# Patient Record
Sex: Female | Born: 1995 | Race: Black or African American | Hispanic: No | Marital: Single | Smoking: Never smoker
Health system: Southern US, Community
[De-identification: ages and names within clinical notes are randomized; demographics above are authoritative.]

## PROBLEM LIST (undated history)

## (undated) ENCOUNTER — Inpatient Hospital Stay (HOSPITAL_COMMUNITY): Payer: Self-pay

## (undated) DIAGNOSIS — N83209 Unspecified ovarian cyst, unspecified side: Secondary | ICD-10-CM

## (undated) DIAGNOSIS — D649 Anemia, unspecified: Secondary | ICD-10-CM

## (undated) DIAGNOSIS — F329 Major depressive disorder, single episode, unspecified: Secondary | ICD-10-CM

## (undated) DIAGNOSIS — F32A Depression, unspecified: Secondary | ICD-10-CM

## (undated) HISTORY — PX: INDUCED ABORTION: SHX677

## (undated) HISTORY — PX: WISDOM TOOTH EXTRACTION: SHX21

---
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
## 2016-05-31 ENCOUNTER — Inpatient Hospital Stay (HOSPITAL_COMMUNITY)
Admission: AD | Admit: 2016-05-31 | Discharge: 2016-05-31 | Disposition: A | Discharge status: Home / Self Care | Payer: Medicaid Other | Source: Ambulatory Visit | Attending: Family Medicine | Admitting: Family Medicine

## 2016-05-31 ENCOUNTER — Encounter (HOSPITAL_COMMUNITY): Payer: Self-pay | Admitting: *Deleted

## 2016-05-31 DIAGNOSIS — O219 Vomiting of pregnancy, unspecified: Secondary | ICD-10-CM

## 2016-05-31 DIAGNOSIS — Z3A22 22 weeks gestation of pregnancy: Secondary | ICD-10-CM | POA: Diagnosis not present

## 2016-05-31 DIAGNOSIS — O218 Other vomiting complicating pregnancy: Secondary | ICD-10-CM | POA: Insufficient documentation

## 2016-05-31 LAB — URINALYSIS, ROUTINE W REFLEX MICROSCOPIC
BILIRUBIN URINE: NEGATIVE
Glucose, UA: NEGATIVE mg/dL
Hgb urine dipstick: NEGATIVE
Ketones, ur: 15 mg/dL — AB
Leukocytes, UA: NEGATIVE
NITRITE: NEGATIVE
Protein, ur: NEGATIVE mg/dL
SPECIFIC GRAVITY, URINE: 1.02 (ref 1.005–1.030)
pH: 6.5 (ref 5.0–8.0)

## 2016-05-31 MED ORDER — PROMETHAZINE HCL 25 MG PO TABS: 12.5000 mg | ORAL_TABLET | Freq: Four times a day (QID) | ORAL | 2 refills | Status: DC | PRN

## 2016-05-31 NOTE — MAU Provider Note (Signed)
Chief Complaint: No chief complaint on file.   None     SUBJECTIVE HPI: Haley Potts is a 20 y.o. G1P0000 at 3853w0d by LMP who presents to maternity admissions reporting nausea x 2 weeks with positive pregnancy [facility] last week.  She denies abdominal pain or vaginal bleeding. She is vomiting 3 x /day.  She has tried eating small frequent meals and sipping fluids but it has not helped. She has not tried any medications.  She is unsure of her LMP and wants to know how far along she is.  She desires prenatal care with [facility]. She denies vaginal bleeding, vaginal itching/burning, urinary symptoms, h/a, dizziness, or fever/chills.     HPI  History reviewed. No pertinent past medical history. Past Surgical History:  Procedure Laterality Date  . WISDOM TOOTH EXTRACTION     Social History   Social History  . Marital status: Single    Spouse name: N/A  . Number of children: N/A  . Years of education: N/A   Occupational History  . Not on file.   Social History Main Topics  . Smoking status: Never Smoker  . Smokeless tobacco: Never Used  . Alcohol use No  . Drug use: No  . Sexual activity: Not Currently    Birth control/ protection: None   Other Topics Concern  . Not on file   Social History Narrative  . No narrative on file   No current facility-administered medications on file prior to encounter.    No current outpatient prescriptions on file prior to encounter.   No Known Allergies  ROS:  Review of Systems  Constitutional: Negative for chills, fatigue and fever.  Respiratory: Negative for shortness of breath.   Cardiovascular: Negative for chest pain.  Gastrointestinal: Positive for nausea and vomiting.  Genitourinary: Negative for difficulty urinating, dysuria, flank pain, pelvic pain, vaginal bleeding, vaginal discharge and vaginal pain.  Neurological: Negative for dizziness and headaches.  Psychiatric/Behavioral: Negative.      I have reviewed  patient's Past Medical Hx, Surgical Hx, Family Hx, Social Hx, medications and allergies.   Physical Exam  Patient Vitals for the past 24 hrs:  BP Temp Temp src Pulse Resp Height Weight  05/31/16 1625 99/62 98.3 F (36.8 C) Oral 67 16 5\' 4"  (1.626 m) 123 lb 3.2 oz (55.9 kg)   Constitutional: Well-developed, well-nourished female in no acute distress.  Cardiovascular: normal rate Respiratory: normal effort GI: Abd soft, non-tender. Pos BS x 4 MS: Extremities nontender, no edema, normal ROM Neurologic: Alert and oriented x 4.  GU: Neg CVAT.  PELVIC EXAM: Deferred  LAB RESULTS Results for orders placed or performed during the hospital encounter of 05/31/16 (from the past 24 hour(s))  Urinalysis, Routine w reflex microscopic (not at [facility])     Status: Abnormal   Collection Time: 05/31/16  4:59 PM  Result Value Ref Range   Color, Urine YELLOW YELLOW   APPearance HAZY (A) CLEAR   Specific Gravity, Urine 1.020 1.005 - 1.030   pH 6.5 5.0 - 8.0   Glucose, UA NEGATIVE NEGATIVE mg/dL   Hgb urine dipstick NEGATIVE NEGATIVE   Bilirubin Urine NEGATIVE NEGATIVE   Ketones, ur 15 (A) NEGATIVE mg/dL   Protein, ur NEGATIVE NEGATIVE mg/dL   Nitrite NEGATIVE NEGATIVE   Leukocytes, UA NEGATIVE NEGATIVE       IMAGING No results found.  MAU Management/MDM: Ordered labs and reviewed results.  Bedside US with CRL c/w 6222w1d, LMP unsure but c/w 7  weeks.  [facility]  Phenergan 12.5-25 mg PO Q 6 hours PRN.  List of safe OTC medications and [facility] providers given today.  Reviewed first trimester precautions/reasons to return to MAU. Pt stable at time of discharge.  ASSESSMENT 1. Nausea and vomiting during pregnancy prior to [redacted] weeks gestation     PLAN Discharge home     Medication List    TAKE these medications   promethazine 25 MG tablet Commonly known as:  PHENERGAN Take 0.5-1 tablets (12.5-25 mg total) by mouth every 6 (six) hours as needed.       Follow-up Information    [facility]. Schedule an appointment as soon as possible for a visit.   Why:  Return to MAU as needed for emergencies Contact information: [address]  [address] [place] [address] [phone]           Sharen CounterLisa Leftwich-Kirby Certified Nurse-Midwife 05/31/2016  7:13 PM

## 2016-05-31 NOTE — MAU Note (Signed)
Few days ago found out she was pregnant. (+preg test in [facility] 11/25- [facility]).  Did not find out how far a long she is.  Has been really nauseated, can't really keep anything on her stomach.

## 2016-06-08 ENCOUNTER — Encounter (HOSPITAL_COMMUNITY): Payer: Self-pay | Admitting: *Deleted

## 2016-06-08 ENCOUNTER — Inpatient Hospital Stay (HOSPITAL_COMMUNITY)
Admission: AD | Admit: 2016-06-08 | Discharge: 2016-06-08 | Disposition: A | Discharge status: Home / Self Care | Payer: Medicaid Other | Source: Ambulatory Visit | Attending: Family Medicine | Admitting: Family Medicine

## 2016-06-08 DIAGNOSIS — Z3A08 8 weeks gestation of pregnancy: Secondary | ICD-10-CM | POA: Insufficient documentation

## 2016-06-08 DIAGNOSIS — O211 Hyperemesis gravidarum with metabolic disturbance: Secondary | ICD-10-CM | POA: Diagnosis not present

## 2016-06-08 DIAGNOSIS — O219 Vomiting of pregnancy, unspecified: Secondary | ICD-10-CM

## 2016-06-08 DIAGNOSIS — Z3491 Encounter for supervision of normal pregnancy, unspecified, first trimester: Secondary | ICD-10-CM

## 2016-06-08 DIAGNOSIS — O210 Mild hyperemesis gravidarum: Secondary | ICD-10-CM | POA: Diagnosis present

## 2016-06-08 DIAGNOSIS — E860 Dehydration: Secondary | ICD-10-CM

## 2016-06-08 LAB — COMPREHENSIVE METABOLIC PANEL
ALBUMIN: 4.6 g/dL (ref 3.5–5.0)
ALT: 9 U/L — AB (ref 14–54)
AST: 19 U/L (ref 15–41)
Alkaline Phosphatase: 47 U/L (ref 38–126)
Anion gap: 9 (ref 5–15)
BUN: 14 mg/dL (ref 6–20)
CHLORIDE: 99 mmol/L — AB (ref 101–111)
CO2: 25 mmol/L (ref 22–32)
CREATININE: 0.71 mg/dL (ref 0.44–1.00)
Calcium: 9.9 mg/dL (ref 8.9–10.3)
GFR calc Af Amer: >60 mL/min (ref >60)
GLUCOSE: 76 mg/dL (ref 65–99)
Potassium: 3.9 mmol/L (ref 3.5–5.1)
Sodium: 133 mmol/L — ABNORMAL LOW (ref 135–145)
Total Bilirubin: 0.5 mg/dL (ref 0.3–1.2)
Total Protein: 8.8 g/dL — ABNORMAL HIGH (ref 6.5–8.1)

## 2016-06-08 LAB — URINALYSIS, ROUTINE W REFLEX MICROSCOPIC
BILIRUBIN URINE: NEGATIVE
Glucose, UA: 150 mg/dL — AB
Hgb urine dipstick: NEGATIVE
Ketones, ur: 80 mg/dL — AB
LEUKOCYTES UA: NEGATIVE
Nitrite: NEGATIVE
Protein, ur: 30 mg/dL — AB
SPECIFIC GRAVITY, URINE: 1.029 (ref 1.005–1.030)
pH: 5 (ref 5.0–8.0)

## 2016-06-08 LAB — CBC
HCT: 43 % (ref 36.0–46.0)
Hemoglobin: 15.3 g/dL — ABNORMAL HIGH (ref 12.0–15.0)
MCH: 30.3 pg (ref 26.0–34.0)
MCHC: 35.6 g/dL (ref 30.0–36.0)
MCV: 85.1 fL (ref 78.0–100.0)
PLATELETS: 290 10*3/uL (ref 150–400)
RBC: 5.05 MIL/uL (ref 3.87–5.11)
RDW: 12.4 % (ref 11.5–15.5)
WBC: 7.3 10*3/uL (ref 4.0–10.5)

## 2016-06-08 MED ORDER — LACTATED RINGERS IV BOLUS (SEPSIS)
1000.0000 mL | Freq: Once | INTRAVENOUS | Status: AC
  Administered 2016-06-08: 1000 mL via INTRAVENOUS

## 2016-06-08 MED ORDER — METOCLOPRAMIDE HCL 10 MG PO TABS: 10.0000 mg | ORAL_TABLET | Freq: Three times a day (TID) | ORAL | 0 refills | Status: DC | PRN

## 2016-06-08 MED ORDER — METOCLOPRAMIDE HCL 5 MG/ML IJ SOLN
10.0000 mg | Freq: Once | INTRAMUSCULAR | Status: AC
  Administered 2016-06-08: 10 mg via INTRAVENOUS
  Filled 2016-06-08: qty 2

## 2016-06-08 MED ORDER — M.V.I. ADULT IV INJ
Freq: Once | INTRAVENOUS | Status: AC
  Administered 2016-06-08: 15:00:00 via INTRAVENOUS
  Filled 2016-06-08: qty 10

## 2016-06-08 MED ORDER — FAMOTIDINE IN NACL 20-0.9 MG/50ML-% IV SOLN
20.0000 mg | Freq: Once | INTRAVENOUS | Status: AC
  Administered 2016-06-08: 20 mg via INTRAVENOUS
  Filled 2016-06-08: qty 50

## 2016-06-08 NOTE — MAU Note (Signed)
Can't technically keep anything down that she eats.  So really hasn't eaten in 3 or 4 days.

## 2016-06-08 NOTE — Discharge Instructions (Signed)
Dehydration, Adult °Dehydration is when there is not enough fluid or water in your body. This happens when you lose more fluids than you take in. Dehydration can range from mild to very bad. It should be treated right away to keep it from getting very bad. °Symptoms of mild dehydration may include: °· Thirst. °· Dry lips. °· Slightly dry mouth. °· Dry, warm skin. °· Dizziness. °Symptoms of moderate dehydration may include: °· Very dry mouth. °· Muscle cramps. °· Dark pee (urine). Pee may be the color of tea. °· Your body making less pee. °· Your eyes making fewer tears. °· Heartbeat that is uneven or faster than normal (palpitations). °· Headache. °· Light-headedness, especially when you stand up from sitting. °· Fainting (syncope). °Symptoms of very bad dehydration may include: °· Changes in skin, such as: °¨ Cold and clammy skin. °¨ Blotchy (mottled) or pale skin. °¨ Skin that does not quickly return to normal after being lightly pinched and let go (poor skin turgor). °· Changes in body fluids, such as: °¨ Feeling very thirsty. °¨ Your eyes making fewer tears. °¨ Not sweating when body temperature is high, such as in hot weather. °¨ Your body making very little pee. °· Changes in vital signs, such as: °¨ Weak pulse. °¨ Pulse that is more than 100 beats a minute when you are sitting still. °¨ Fast breathing. °¨ Low blood pressure. °· Other changes, such as: °¨ Sunken eyes. °¨ Cold hands and feet. °¨ Confusion. °¨ Lack of energy (lethargy). °¨ Trouble waking up from sleep. °¨ Short-term weight loss. °¨ Unconsciousness. °Follow these instructions at home: °· If told by your doctor, drink an ORS: °¨ Make an ORS by using instructions on the package. °¨ Start by drinking small amounts, about ½ cup (120 mL) every 5-10 minutes. °¨ Slowly drink more until you have had the amount that your doctor said to have. °· Drink enough clear fluid to keep your pee clear or pale yellow. If you were told to drink an ORS, finish the ORS  first, then start slowly drinking clear fluids. Drink fluids such as: °¨ Water. Do not drink only water by itself. Doing that can make the salt (sodium) level in your body get too low (hyponatremia). °¨ Ice chips. °¨ Fruit juice that you have added water to (diluted). °¨ Low-calorie sports drinks. °· Avoid: °¨ Alcohol. °¨ Drinks that have a lot of sugar. These include high-calorie sports drinks, fruit juice that does not have water added, and soda. °¨ Caffeine. °¨ Foods that are greasy or have a lot of fat or sugar. °· Take over-the-counter and prescription medicines only as told by your doctor. °· Do not take salt tablets. Doing that can make the salt level in your body get too high (hypernatremia). °· Eat foods that have minerals (electrolytes). Examples include bananas, oranges, potatoes, tomatoes, and spinach. °· Keep all follow-up visits as told by your doctor. This is important. °Contact a doctor if: °· You have belly (abdominal) pain that: °¨ Gets worse. °¨ Stays in one area (localizes). °· You have a rash. °· You have a stiff neck. °· You get angry or annoyed more easily than normal (irritability). °· You are more sleepy than normal. °· You have a harder time waking up than normal. °· You feel: °¨ Weak. °¨ Dizzy. °¨ Very thirsty. °· You have peed (urinated) only a small amount of very dark pee during 6-8 hours. °Get help right away if: °· You have symptoms of   very bad dehydration.  You cannot drink fluids without throwing up (vomiting).  Your symptoms get worse with treatment.  You have a fever.  You have a very bad headache.  You are throwing up or having watery poop (diarrhea) and it:  Gets worse.  Does not go away.  You have blood or something green (bile) in your throw-up.  You have blood in your poop (stool). This may cause poop to look black and tarry.  You have not peed in 6-8 hours.  You pass out (faint).  Your heart rate when you are sitting still is more than 100 beats a  minute.  You have trouble breathing. This information is not intended to replace advice given to you by your health care provider. Make sure you discuss any questions you have with your health care provider. Document Released: 04/16/2009 Document Revised: 01/08/2016 Document Reviewed: 08/14/2015 Elsevier Interactive Patient Education  2017 Elsevier Inc.  Morning Sickness Morning sickness is when you feel sick to your stomach (nauseous) during pregnancy. You may feel sick to your stomach and throw up (vomit). You may feel sick in the morning, but you can feel this way any time of day. Some women feel very sick to their stomach and cannot stop throwing up (hyperemesis gravidarum). Follow these instructions at home:  Only take medicines as told by your doctor.  Take multivitamins as told by your doctor. Taking multivitamins before getting pregnant can stop or lessen the harshness of morning sickness.  Eat dry toast or unsalted crackers before getting out of bed.  Eat 5 to 6 small meals a day.  Eat dry and bland foods like rice and baked potatoes.  Do not drink liquids with meals. Drink between meals.  Do not eat greasy, fatty, or spicy foods.  Have someone cook for you if the smell of food causes you to feel sick or throw up.  If you feel sick to your stomach after taking prenatal vitamins, take them at night or with a snack.  Eat protein when you need a snack (nuts, yogurt, cheese).  Eat unsweetened gelatins for dessert.  Wear a bracelet used for sea sickness (acupressure wristband).  Go to a doctor that puts thin needles into certain body points (acupuncture) to improve how you feel.  Do not smoke.  Use a humidifier to keep the air in your house free of odors.  Get lots of fresh air. Contact a doctor if:  You need medicine to feel better.  You feel dizzy or lightheaded.  You are losing weight. Get help right away if:  You feel very sick to your stomach and cannot  stop throwing up.  You pass out (faint). This information is not intended to replace advice given to you by your health care provider. Make sure you discuss any questions you have with your health care provider. Document Released: 07/28/2004 Document Revised: 11/26/2015 Document Reviewed: 12/05/2012 Elsevier Interactive Patient Education  2017 [employer]

## 2016-06-08 NOTE — MAU Note (Signed)
Patient states the label on the phenergan says to eat something prior to taking and since she can't eat, she is not taking it.

## 2016-06-08 NOTE — MAU Provider Note (Signed)
History     CSN: [id number]  Arrival date and time: 06/08/16 1057   None     Chief Complaint  Patient presents with  . Morning Sickness   G1 @[redacted]w[redacted]d  here with nausea and vomiting x1-2 weeks. Last emesis last night. She in unable tolerate food or fluids. She denies sick contacts. She reports weight loss of 10 lbs. She was seen for similar sx last week and given Phenergan which she reports is not helping. She denies GERD and excessive saliva.   OB History    Gravida Para Term Preterm AB Living   1 0 0 0 0 0   SAB TAB Ectopic Multiple Live Births   0 0 0 0        History reviewed. No pertinent past medical history.  Past Surgical History:  Procedure Laterality Date  . WISDOM TOOTH EXTRACTION      History reviewed. No pertinent family history.  Social History  Substance Use Topics  . Smoking status: Never Smoker  . Smokeless tobacco: Never Used  . Alcohol use No    Allergies: No Known Allergies  Prescriptions Prior to Admission  Medication Sig Dispense Refill Last Dose  . promethazine (PHENERGAN) 25 MG tablet Take 0.5-1 tablets (12.5-25 mg total) by mouth every 6 (six) hours as needed. 30 tablet 2     Review of Systems  Constitutional: Negative.   Gastrointestinal: Positive for nausea and vomiting. Negative for constipation, diarrhea and heartburn.   Physical Exam   Blood pressure 102/68, pulse 79, temperature 98.3 F (36.8 C), temperature source Oral, resp. rate 16, weight 52.3 kg (115 lb 6.4 oz), last menstrual period 04/12/2016.  Physical Exam  Constitutional: She is oriented to person, place, and time. She appears well-developed and well-nourished. No distress (appears comfortable).  HENT:  Head: Normocephalic and atraumatic.  Neck: Normal range of motion. Neck supple.  Cardiovascular: Normal rate.   Respiratory: Effort normal.  GI: Soft. She exhibits no distension and no mass. There is no tenderness. There is no rebound and no guarding.   Musculoskeletal: Normal range of motion.  Neurological: She is alert and oriented to person, place, and time.  Skin: Skin is warm and dry.  Psychiatric: She has a normal mood and affect.   Results for orders placed or performed during the hospital encounter of 06/08/16 (from the past 24 hour(s))  Urinalysis, Routine w reflex microscopic     Status: Abnormal   Collection Time: 06/08/16 11:53 AM  Result Value Ref Range   Color, Urine YELLOW YELLOW   APPearance CLOUDY (A) CLEAR   Specific Gravity, Urine 1.029 1.005 - 1.030   pH 5.0 5.0 - 8.0   Glucose, UA 150 (A) NEGATIVE mg/dL   Hgb urine dipstick NEGATIVE NEGATIVE   Bilirubin Urine NEGATIVE NEGATIVE   Ketones, ur 80 (A) NEGATIVE mg/dL   Protein, ur 30 (A) NEGATIVE mg/dL   Nitrite NEGATIVE NEGATIVE   Leukocytes, UA NEGATIVE NEGATIVE   RBC / HPF 0-5 0 - 5 RBC/hpf   WBC, UA 6-30 0 - 5 WBC/hpf   Bacteria, UA RARE (A) NONE SEEN   Squamous Epithelial / LPF 6-30 (A) NONE SEEN   Mucous PRESENT   CBC     Status: Abnormal   Collection Time: 06/08/16  1:33 PM  Result Value Ref Range   WBC 7.3 4.0 - 10.5 K/uL   RBC 5.05 3.87 - 5.11 MIL/uL   Hemoglobin 15.3 (H) 12.0 - 15.0 g/dL   HCT 86.543.0 78.436.0 - 69.646.0 %  MCV 85.1 78.0 - 100.0 fL   MCH 30.3 26.0 - 34.0 pg   MCHC 35.6 30.0 - 36.0 g/dL   RDW 62.112.4 30.811.5 - 65.715.5 %   Platelets 290 150 - 400 K/uL  Comprehensive metabolic panel     Status: Abnormal   Collection Time: 06/08/16  1:33 PM  Result Value Ref Range   Sodium 133 (L) 135 - 145 mmol/L   Potassium 3.9 3.5 - 5.1 mmol/L   Chloride 99 (L) 101 - 111 mmol/L   CO2 25 22 - 32 mmol/L   Glucose, Bld 76 65 - 99 mg/dL   BUN 14 6 - 20 mg/dL   Creatinine, Ser 8.460.71 0.44 - 1.00 mg/dL   Calcium 9.9 8.9 - 96.210.3 mg/dL   Total Protein 8.8 (H) 6.5 - 8.1 g/dL   Albumin 4.6 3.5 - 5.0 g/dL   AST 19 15 - 41 U/L   ALT 9 (L) 14 - 54 U/L   Alkaline Phosphatase 47 38 - 126 U/L   Total Bilirubin 0.5 0.3 - 1.2 mg/dL   GFR calc non Af Amer >60 >60 mL/min    GFR calc Af Amer >60 >60 mL/min   Anion gap 9 5 - 15    MAU Course  Procedures Reglan 10 mg IV Pepcid 20 mg IV LR 1L bolus MTV IV  MDM Labs ordered and reviewed. Nausea improved after meds. Tolerating po food and fluids. Stable for discharge home.  Assessment and Plan   1. Nausea/vomiting in pregnancy   2. Dehydration   3. First trimester pregnancy    Discharge home Follow up with OB provider of choice Diclegis OTC regimen    Medication List    STOP taking these medications   promethazine 25 MG tablet Commonly known as:  PHENERGAN     TAKE these medications   metoCLOPramide 10 MG tablet Commonly known as:  REGLAN Take 1 tablet (10 mg total) by mouth every 8 (eight) hours as needed for nausea.      Donette LarryMelanie Brad Mcgaughy, CNM 06/08/2016, 1:01 PM

## 2016-06-21 ENCOUNTER — Emergency Department (HOSPITAL_BASED_OUTPATIENT_CLINIC_OR_DEPARTMENT_OTHER)
Admission: EM | Admit: 2016-06-21 | Discharge: 2016-06-21 | Disposition: A | Discharge status: Home / Self Care | Payer: Medicaid Other | Attending: Emergency Medicine | Admitting: Emergency Medicine

## 2016-06-21 ENCOUNTER — Encounter (HOSPITAL_BASED_OUTPATIENT_CLINIC_OR_DEPARTMENT_OTHER): Payer: Self-pay | Admitting: *Deleted

## 2016-06-21 DIAGNOSIS — O210 Mild hyperemesis gravidarum: Secondary | ICD-10-CM | POA: Diagnosis not present

## 2016-06-21 DIAGNOSIS — O219 Vomiting of pregnancy, unspecified: Secondary | ICD-10-CM | POA: Diagnosis present

## 2016-06-21 DIAGNOSIS — Z3A10 10 weeks gestation of pregnancy: Secondary | ICD-10-CM | POA: Insufficient documentation

## 2016-06-21 MED ORDER — METOCLOPRAMIDE HCL 5 MG/ML IJ SOLN
10.0000 mg | Freq: Once | INTRAMUSCULAR | Status: AC
  Administered 2016-06-21: 10 mg via INTRAMUSCULAR
  Filled 2016-06-21: qty 2

## 2016-06-21 MED ORDER — METOCLOPRAMIDE HCL 10 MG PO TABS: 10.0000 mg | ORAL_TABLET | Freq: Three times a day (TID) | ORAL | 0 refills | Status: DC | PRN

## 2016-06-21 MED FILL — METOCLOPRAMIDE 10 MG TABLET: 10 | 10 days supply | Qty: 30 | Fill #0

## 2016-06-21 NOTE — ED Triage Notes (Signed)
Pt reports vomiting.  [facility] and has since vomited after every meal. [redacted] weeks pregnant.

## 2016-06-21 NOTE — ED Provider Notes (Signed)
[facility] Provider Note   CSN: [id number] Arrival date & time: 06/21/16  1420     History   Chief Complaint Chief Complaint  Patient presents with  . Emesis During Pregnancy    HPI Haley Potts is a 20 y.o. female.  20 yo F gravida 1 para 0 with a chief complaint of nausea and vomiting. This is been an ongoing issue for her. She has Reglan prescribed at home. She has run out of this and has had trouble keeping anything down today. Denies fevers or chills denies abdominal pain denies pelvic cramping or bleeding.   The history is provided by the patient and a relative.  Illness  This is a new problem. The current episode started 2 days ago. The problem occurs constantly. The problem has not changed since onset.Pertinent negatives include no chest pain, no headaches and no shortness of breath. Nothing aggravates the symptoms. Nothing relieves the symptoms. She has tried nothing for the symptoms. The treatment provided no relief.    History reviewed. No pertinent past medical history.  There are no active problems to display for this patient.   Past Surgical History:  Procedure Laterality Date  . WISDOM TOOTH EXTRACTION      OB History    Gravida Para Term Preterm AB Living   1 0 0 0 0 0   SAB TAB Ectopic Multiple Live Births   0 0 0 0         Home Medications    Prior to Admission medications   Medication Sig Start Date End Date Taking? Authorizing Provider  metoCLOPramide (REGLAN) 10 MG tablet Take 1 tablet (10 mg total) by mouth every 8 (eight) hours as needed for nausea. 06/21/16   Melene Planan Rashon Rezek, DO    Family History History reviewed. No pertinent family history.  Social History Social History  Substance Use Topics  . Smoking status: Never Smoker  . Smokeless tobacco: Never Used  . Alcohol use No     Allergies   Patient has no known allergies.   Review of Systems Review of Systems  Constitutional: Negative for chills and fever.    HENT: Negative for congestion and rhinorrhea.   Eyes: Negative for redness and visual disturbance.  Respiratory: Negative for shortness of breath and wheezing.   Cardiovascular: Negative for chest pain and palpitations.  Gastrointestinal: Positive for nausea and vomiting.  Genitourinary: Negative for difficulty urinating, dysuria, flank pain, pelvic pain, urgency, vaginal bleeding, vaginal discharge and vaginal pain.  Musculoskeletal: Negative for arthralgias and myalgias.  Skin: Negative for pallor and wound.  Neurological: Negative for dizziness and headaches.     Physical Exam Updated Vital Signs BP 109/66 (BP Location: Left Arm)   Pulse 85   Temp 98.3 F (36.8 C) (Oral)   Resp 18   Ht 5\' 4"  (1.626 m)   Wt 118 lb 8 oz (53.8 kg)   LMP 04/12/2016 Comment: ? October, spotted a few wks ago  SpO2 98%   BMI 20.34 kg/m   Physical Exam  Constitutional: She is oriented to person, place, and time. She appears well-developed and well-nourished. No distress.  HENT:  Head: Normocephalic and atraumatic.  Mouth/Throat: Oropharynx is clear and moist.  Eyes: EOM are normal. Pupils are equal, round, and reactive to light.  Neck: Normal range of motion. Neck supple.  Cardiovascular: Normal rate and regular rhythm.  Exam reveals no gallop and no friction rub.   No murmur heard. Pulmonary/Chest: Effort normal. She has no  wheezes. She has no rales.  Abdominal: Soft. She exhibits no distension and no mass. There is no tenderness. There is no guarding.  Musculoskeletal: She exhibits no edema or tenderness.  Neurological: She is alert and oriented to person, place, and time.  Skin: Skin is warm and dry. She is not diaphoretic.  Psychiatric: She has a normal mood and affect. Her behavior is normal.  Nursing note and vitals reviewed.    ED Treatments / Results  Labs (all labs ordered are listed, but only abnormal results are displayed) Labs Reviewed - No data to display  EKG  EKG  Interpretation None       Radiology No results found.  Procedures Procedures (including critical care time)  Medications Ordered in ED Medications  metoCLOPramide (REGLAN) injection 10 mg (10 mg Intramuscular Given 06/21/16 1533)     Initial Impression / Assessment and Plan / ED Course  I have reviewed the triage vital signs and the nursing notes.  Pertinent labs & imaging results that were available during my care of the patient were reviewed by me and considered in my medical decision making (see chart for details).  Clinical Course     20 yo F Gravida 1 para 0 with a chief complaint of hyperemesis. Patient is well-appearing and nontoxic, appears well-hydrated. We'll give her a dose Reglan here oral trial.  The patient is feeling much better after her Reglan. Able take by mouth without difficulty. Discharge home.  4:19 PM:  I have discussed the diagnosis/risks/treatment options with the patient and family and believe the pt to be eligible for discharge home to follow-up with OB. We also discussed returning to the ED immediately if new or worsening sx occur. We discussed the sx which are most concerning (e.g., pelvic pain, fever, vaginal bleeding) that necessitate immediate return. Medications administered to the patient during their visit and any new prescriptions provided to the patient are listed below.  Medications given during this visit Medications  metoCLOPramide (REGLAN) injection 10 mg (10 mg Intramuscular Given 06/21/16 1533)     The patient appears reasonably screen and/or stabilized for discharge and I doubt any other medical condition or other [facility] requiring further screening, evaluation, or treatment in the ED at this time prior to discharge.   Final Clinical Impressions(s) / ED Diagnoses   Final diagnoses:  Hyperemesis gravidarum    New Prescriptions Current Discharge Medication List       Melene PlanDan Seab Axel, DO 06/21/16 1619

## 2016-07-04 NOTE — L&D Delivery Note (Signed)
Delivery Note  First Stage: Labor onset: 2300 Augmentation : none Analgesia /Anesthesia intrapartum: epidural AROM at 0350  Second Stage: Complete dilation at 0720 Onset of pushing at 0720 FHR second stage 150  Delivery of a viable female at [id number] by CNM in ROA position Loose nuchal cord x 2 Cord double clamped after cessation of pulsation, cut by FOB Cord blood sample collected   Third Stage: Placenta delivered via Tomasa BlaseSchultz intact with 3 VC @ 0745 Placenta disposition: L&D Uterine tone firm / bleeding brisk / Pitocin bolus started post placental; brisk flow of blood - unable to identify source; repair intact and hemostatic despite steady flow of blood.  Fundus firm and midline. Cytotec 800 mcg rectally administered.  Methergine given. Dr. Nira Retortegele called to room to assess source of bleeding - cervix examined thoroughly and found to be intact.  Dr. Adrian BlackwaterStinson called to room - uterine exploration done, Catheter re-inserted with 150 ml urine in return.  Bleeding normal.  Methergine series ordered postpartum.   2nd degree laceration identified  Anesthesia for repair: epidural Repair 2.0 vicryl x 2 Est. Blood Loss (mL): 1000  Complications: [facility]  Mom to postpartum.  Baby to Couplet care / Skin to Skin.  Newborn: Birth Weight: 7 lbs 11.8 oz  Apgar Scores: 7/8 Feeding planned: bottle  Haley Moraolitta Keyaria Lawson  MSN, CNM 01/30/2017, 8:26 AM

## 2016-07-13 ENCOUNTER — Encounter: Payer: Self-pay | Admitting: Certified Nurse Midwife

## 2016-07-13 ENCOUNTER — Ambulatory Visit (INDEPENDENT_AMBULATORY_CARE_PROVIDER_SITE_OTHER): Payer: Medicaid Other | Admitting: Certified Nurse Midwife

## 2016-07-13 ENCOUNTER — Other Ambulatory Visit (HOSPITAL_COMMUNITY)
Admission: RE | Admit: 2016-07-13 | Discharge: 2016-07-13 | Disposition: A | Discharge status: Home / Self Care | Payer: Medicaid Other | Source: Ambulatory Visit | Attending: Certified Nurse Midwife | Admitting: Certified Nurse Midwife

## 2016-07-13 VITALS — BP 100/65 | HR 72 | Temp 99.3°F | Wt 119.9 lb

## 2016-07-13 DIAGNOSIS — Z23 Encounter for immunization: Secondary | ICD-10-CM

## 2016-07-13 DIAGNOSIS — Z3490 Encounter for supervision of normal pregnancy, unspecified, unspecified trimester: Secondary | ICD-10-CM | POA: Diagnosis not present

## 2016-07-13 DIAGNOSIS — Z3492 Encounter for supervision of normal pregnancy, unspecified, second trimester: Secondary | ICD-10-CM

## 2016-07-13 DIAGNOSIS — Z3400 Encounter for supervision of normal first pregnancy, unspecified trimester: Secondary | ICD-10-CM | POA: Insufficient documentation

## 2016-07-13 DIAGNOSIS — O219 Vomiting of pregnancy, unspecified: Secondary | ICD-10-CM

## 2016-07-13 MED ORDER — ONDANSETRON HCL 8 MG PO TABS: 8.0000 mg | ORAL_TABLET | Freq: Three times a day (TID) | ORAL | 2 refills | Status: DC | PRN

## 2016-07-13 MED ORDER — PRENATE PIXIE 10-0.6-0.4-200 MG PO CAPS: 1.0000 | ORAL_CAPSULE | Freq: Every day | ORAL | 12 refills | Status: DC

## 2016-07-13 NOTE — Progress Notes (Signed)
Subjective:    Haley Potts is being seen today for her first obstetrical visit.  This is not a planned pregnancy. She is at [redacted]w[redacted]d gestation. Her obstetrical history is significant for none. Relationship with FOB: significant other, living together. Patient does intend to breast feed. Pregnancy history fully reviewed.  The information documented in the HPI was reviewed and verified.  Menstrual History: OB History    Gravida Para Term Preterm AB Living   1 0 0 0 0 0   SAB TAB Ectopic Multiple Live Births   0 0 0 0         Patient's last menstrual period was 04/12/2016 (approximate).    History reviewed. No pertinent past medical history.  Past Surgical History:  Procedure Laterality Date  . WISDOM TOOTH EXTRACTION       (Not in a hospital admission) No Known Allergies  Social History  Substance Use Topics  . Smoking status: Never Smoker  . Smokeless tobacco: Never Used  . Alcohol use No    History reviewed. No pertinent family history.   Review of Systems Constitutional: negative for weight loss Gastrointestinal: negative for vomiting Genitourinary:negative for genital lesions and vaginal discharge and dysuria Musculoskeletal:negative for back pain Behavioral/Psych: negative for abusive relationship, depression, illegal drug usage and tobacco use    Objective:    BP 100/65   Pulse 72   Temp 99.3 F (37.4 C)   Wt 119 lb 14.4 oz (54.4 kg)   LMP 04/12/2016 (Approximate) Comment: ? October, spotted a few wks ago  BMI 20.58 kg/m  General Appearance:    Alert, cooperative, no distress, appears stated age  Head:    Normocephalic, without obvious abnormality, atraumatic  Eyes:    PERRL, conjunctiva/corneas clear, EOM's intact, fundi    benign, both eyes  Ears:    Normal TM's and external ear canals, both ears  Nose:   Nares normal, septum midline, mucosa normal, no drainage    or sinus tenderness  Throat:   Lips, mucosa, and tongue normal; teeth and gums normal   Neck:   Supple, symmetrical, trachea midline, no adenopathy;    thyroid:  no enlargement/tenderness/nodules; no carotid   bruit or JVD  Back:     Symmetric, no curvature, ROM normal, no CVA tenderness  Lungs:     Clear to auscultation bilaterally, respirations unlabored  Chest Wall:    No tenderness or deformity   Heart:    Regular rate and rhythm, S1 and S2 normal, no murmur, rub   or gallop  Breast Exam:    No tenderness, masses, or nipple abnormality  Abdomen:     Soft, non-tender, bowel sounds active all four quadrants,    no masses, no organomegaly  Genitalia:    Normal female without lesion, discharge or tenderness  Extremities:   Extremities normal, atraumatic, no cyanosis or edema  Pulses:   2+ and symmetric all extremities  Skin:   Skin color, texture, turgor normal, no rashes or lesions  Lymph nodes:   Cervical, supraclavicular, and axillary nodes normal  Neurologic:   CNII-XII intact, normal strength, sensation and reflexes    throughout         Cervix:   Long, thick, closed and posterior.  FHR: 155 by doppler. FH: less than U.    Lab Review Urine pregnancy test Labs reviewed yes Radiologic studies reviewed no Assessment:    Pregnancy at [redacted]w[redacted]d weeks   Late Teen pregnancy  Plan:      Prenatal  vitamins.  Counseling provided regarding continued use of seat belts, cessation of alcohol consumption, smoking or use of illicit drugs; infection precautions i.e., influenza/TDAP immunizations, toxoplasmosis,CMV, parvovirus, listeria and varicella; workplace safety, exercise during pregnancy; routine dental care, safe medications, sexual activity, hot tubs, saunas, pools, travel, caffeine use, fish and methlymercury, potential toxins, hair treatments, varicose veins Weight gain recommendations per IOM guidelines reviewed: underweight/BMI< 18.5--> gain 28 - 40 lbs; normal weight/BMI 18.5 - 24.9--> gain 25 - 35 lbs; overweight/BMI 25 - 29.9--> gain 15 - 25 lbs; obese/BMI >30->gain   11 - 20 lbs Problem list reviewed and updated. FIRST/CF mutation testing/NIPT/QUAD SCREEN/fragile X/Ashkenazi Jewish population testing/Spinal muscular atrophy discussed: ordered. Role of ultrasound in pregnancy discussed; fetal survey: ordered. Amniocentesis discussed: not indicated. VBAC calculator score: VBAC consent form provided Meds ordered this encounter  Medications  . ondansetron (ZOFRAN) 8 MG tablet    Sig: Take 1 tablet (8 mg total) by mouth every 8 (eight) hours as needed for nausea or vomiting.    Dispense:  40 tablet    Refill:  2  . Prenat-FeAsp-Meth-FA-DHA w/o A (PRENATE PIXIE) 10-0.6-0.4-200 MG CAPS    Sig: Take 1 tablet by mouth daily.    Dispense:  30 capsule    Refill:  12    Please process coupon: Rx BIN: [id number], RxPCN: [id number], RxGRP: [id number]: [id number], Rx: [id number]: [id number]  SUF: 01   Orders Placed This Encounter  Procedures  . Culture, OB Urine  . US MFM OB COMP + 14 WK    Standing Status:   Future    Standing Expiration Date:   09/10/2017    Order Specific Question:   Reason for Exam (SYMPTOM  OR DIAGNOSIS REQUIRED)    Answer:   dating, fetal anatomy scan    Order Specific Question:   Preferred imaging location?    Answer:   [facility]-Ultrasound  . Flu Vaccine QUAD 36+ mos IM  . TSH  . Hemoglobinopathy evaluation  . Varicella zoster antibody, IgG  . MaterniT21 PLUS Core+SCA    Order Specific Question:   Is the patient insulin dependent?    Answer:   No    Order Specific Question:   Please enter gestational age. This should be expressed as weeks AND days, i.e. 16w 6d. Enter weeks here. Enter days in next question.    Answer:   6013    Order Specific Question:   Please enter gestational age. This should be expressed as weeks AND days, i.e. 16w 6d. Enter days here. Enter weeks in previous question.    Answer:   1    Order Specific Question:   How was gestational age calculated?    Answer:   LMP    Order Specific Question:   Please give the date of LMP OR Ultrasound OR  Estimated date of delivery.    Answer:   01/17/2017    Order Specific Question:   Number of Fetuses (Type of Pregnancy):    Answer:   1    Order Specific Question:   Indications for performing the test? (please choose all that apply):    Answer:   Routine screening    Order Specific Question:   Other Indications? (Y=Yes, N=No)    Answer:   N    Order Specific Question:   If this is a repeat specimen, please indicate the reason:    Answer:   Not indicated    Order Specific Question:   Please specify the patient's race: (C=White/Caucasion, B=Black, I=Native American, A=Asian, H=Hispanic,  O=Other, U=Unknown)    Answer:   B    Order Specific Question:   Donor Egg - indicate if the egg was obtained from in vitro fertilization.    Answer:   N    Order Specific Question:   Age of Egg Donor.    Answer:   56    Order Specific Question:   Prior Down Syndrome/ONTD screening during current pregnancy.    Answer:   N    Order Specific Question:   Prior First Trimester Testing    Answer:   N    Order Specific Question:   Prior Second Trimester Testing    Answer:   N    Order Specific Question:   Family History of Neural Tube Defects    Answer:   N    Order Specific Question:   Prior Pregnancy with Down Syndrome    Answer:   N    Order Specific Question:   Please give the patient's weight (in pounds)    Answer:   119  . ToxASSURE Select 13 (MW), Urine  . Hemoglobin A1c  . Obstetric Panel, Including HIV  . Cystic Fibrosis Mutation 97    Follow up in 4 weeks. 50% of 30 min visit spent on counseling and coordination of care.

## 2016-07-13 NOTE — Patient Instructions (Addendum)
Second Trimester of Pregnancy The second trimester is from week 13 through week 28 (months 4 through 6). The second trimester is often a time when you feel your best. Your body has also adjusted to being pregnant, and you begin to feel better physically. Usually, morning sickness has lessened or quit completely, you may have more energy, and you may have an increase in appetite. The second trimester is also a time when the fetus is growing rapidly. At the end of the sixth month, the fetus is about 9 inches long and weighs about 1 pounds. You will likely begin to feel the baby move (quickening) between 18 and 20 weeks of the pregnancy. Body changes during your second trimester Your body continues to go through many changes during your second trimester. The changes vary from woman to woman.  Your weight will continue to increase. You will notice your lower abdomen bulging out.  You may begin to get stretch marks on your hips, abdomen, and breasts.  You may develop headaches that can be relieved by medicines. The medicines should be approved by your health care provider.  You may urinate more often because the fetus is pressing on your bladder.  You may develop or continue to have heartburn as a result of your pregnancy.  You may develop constipation because certain hormones are causing the muscles that push waste through your intestines to slow down.  You may develop hemorrhoids or swollen, bulging veins (varicose veins).  You may have back pain. This is caused by:  Weight gain.  Pregnancy hormones that are relaxing the joints in your pelvis.  A shift in weight and the muscles that support your balance.  Your breasts will continue to grow and they will continue to become tender.  Your gums may bleed and may be sensitive to brushing and flossing.  Dark spots or blotches (chloasma, mask of pregnancy) may develop on your face. This will likely fade after the baby is born.  A dark line  from your belly button to the pubic area (linea nigra) may appear. This will likely fade after the baby is born.  You may have changes in your hair. These can include thickening of your hair, rapid growth, and changes in texture. Some women also have hair loss during or after pregnancy, or hair that feels dry or thin. Your hair will most likely return to normal after your baby is born. What to expect at prenatal visits During a routine prenatal visit:  You will be weighed to make sure you and the fetus are growing normally.  Your blood pressure will be taken.  Your abdomen will be measured to track your baby's growth.  The fetal heartbeat will be listened to.  Any test results from the previous visit will be discussed. Your health care provider may ask you:  How you are feeling.  If you are feeling the baby move.  If you have had any abnormal symptoms, such as leaking fluid, bleeding, severe headaches, or abdominal cramping.  If you are using any tobacco products, including cigarettes, chewing tobacco, and electronic cigarettes.  If you have any questions. Other tests that may be performed during your second trimester include:  Blood tests that check for:  Low iron levels (anemia).  Gestational diabetes (between 24 and 28 weeks).  Rh antibodies. This is to check for a protein on red blood cells (Rh factor).  Urine tests to check for infections, diabetes, or protein in the urine.  An ultrasound to   confirm the proper growth and development of the baby.  An amniocentesis to check for possible genetic problems.  Fetal screens for spina bifida and Down syndrome.  HIV (human immunodeficiency virus) testing. Routine prenatal testing includes screening for HIV, unless you choose not to have this test. Follow these instructions at home: Eating and drinking  Continue to eat regular, healthy meals.  Avoid raw meat, uncooked cheese, cat litter boxes, and soil used by cats. These  carry germs that can cause birth defects in the baby.  Take your prenatal vitamins.  Take 1500-2000 mg of calcium daily starting at the 20th week of pregnancy until you deliver your baby.  If you develop constipation:  Take over-the-counter or prescription medicines.  Drink enough fluid to keep your urine clear or pale yellow.  Eat foods that are high in fiber, such as fresh fruits and vegetables, whole grains, and beans.  Limit foods that are high in fat and processed sugars, such as fried and sweet foods. Activity  Exercise only as directed by your health care provider. Experiencing uterine cramps is a good sign to stop exercising.  Avoid heavy lifting, wear low heel shoes, and practice good posture.  Wear your seat belt at all times when driving.  Rest with your legs elevated if you have leg cramps or low back pain.  Wear a good support bra for breast tenderness.  Do not use hot tubs, steam rooms, or saunas. Lifestyle  Avoid all smoking, herbs, alcohol, and unprescribed drugs. These chemicals affect the formation and growth of the baby.  Do not use any products that contain nicotine or tobacco, such as cigarettes and e-cigarettes. If you need help quitting, ask your health care provider.  A sexual relationship may be continued unless your health care provider directs you otherwise. General instructions  Follow your health care provider's instructions regarding medicine use. There are medicines that are either safe or unsafe to take during pregnancy.  Take warm sitz baths to soothe any pain or discomfort caused by hemorrhoids. Use hemorrhoid cream if your health care provider approves.  If you develop varicose veins, wear support hose. Elevate your feet for 15 minutes, 3-4 times a day. Limit salt in your diet.  Visit your dentist if you have not gone yet during your pregnancy. Use a soft toothbrush to brush your teeth and be gentle when you floss.  Keep all follow-up  prenatal visits as told by your health care provider. This is important. Contact a health care provider if:  You have dizziness.  You have mild pelvic cramps, pelvic pressure, or nagging pain in the abdominal area.  You have persistent nausea, vomiting, or diarrhea.  You have a bad smelling vaginal discharge.  You have pain with urination. Get help right away if:  You have a fever.  You are leaking fluid from your vagina.  You have spotting or bleeding from your vagina.  You have severe abdominal cramping or pain.  You have rapid weight gain or weight loss.  You have shortness of breath with chest pain.  You notice sudden or extreme swelling of your face, hands, ankles, feet, or legs.  You have not felt your baby move in over an hour.  You have severe headaches that do not go away with medicine.  You have vision changes. Summary  The second trimester is from week 13 through week 28 (months 4 through 6). It is also a time when the fetus is growing rapidly.  Your body goes   through many changes during pregnancy. The changes vary from woman to woman.  Avoid all smoking, herbs, alcohol, and unprescribed drugs. These chemicals affect the formation and growth your baby.  Do not use any tobacco products, such as cigarettes, chewing tobacco, and e-cigarettes. If you need help quitting, ask your health care provider.  Contact your health care provider if you have any questions. Keep all prenatal visits as told by your health care provider. This is important. This information is not intended to replace advice given to you by your health care provider. Make sure you discuss any questions you have with your health care provider. Document Released: 06/14/2001 Document Revised: 11/26/2015 Document Reviewed: 08/21/2012 [employer] Interactive Patient Education  2017 [employer]  

## 2016-07-14 LAB — CERVICOVAGINAL ANCILLARY ONLY
BACTERIAL VAGINITIS: NEGATIVE
CANDIDA VAGINITIS: NEGATIVE
Chlamydia: NEGATIVE
Neisseria Gonorrhea: NEGATIVE
TRICH (WINDOWPATH): NEGATIVE

## 2016-07-18 LAB — URINE CULTURE, OB REFLEX

## 2016-07-18 LAB — CULTURE, OB URINE

## 2016-07-19 LAB — OBSTETRIC PANEL, INCLUDING HIV
ANTIBODY SCREEN: NEGATIVE
BASOS ABS: 0 10*3/uL (ref 0.0–0.2)
BASOS: 0 %
EOS (ABSOLUTE): 0.1 10*3/uL (ref 0.0–0.4)
Eos: 2 %
HEMATOCRIT: 36.4 % (ref 34.0–46.6)
HIV SCREEN 4TH GENERATION: NONREACTIVE
Hemoglobin: 12.2 g/dL (ref 11.1–15.9)
Hepatitis B Surface Ag: NEGATIVE
Immature Grans (Abs): 0 10*3/uL (ref 0.0–0.1)
Immature Granulocytes: 0 %
LYMPHS: 19 %
Lymphocytes Absolute: 1.5 10*3/uL (ref 0.7–3.1)
MCH: 29.1 pg (ref 26.6–33.0)
MCHC: 33.5 g/dL (ref 31.5–35.7)
MCV: 87 fL (ref 79–97)
MONOCYTES: 7 %
MONOS ABS: 0.5 10*3/uL (ref 0.1–0.9)
NEUTROS ABS: 5.8 10*3/uL (ref 1.4–7.0)
Neutrophils: 72 %
PLATELETS: 314 10*3/uL (ref 150–379)
RBC: 4.19 x10E6/uL (ref 3.77–5.28)
RDW: 13.8 % (ref 12.3–15.4)
RPR Ser Ql: NONREACTIVE
Rh Factor: POSITIVE
Rubella Antibodies, IGG: 15.4 index (ref >0.99)
WBC: 7.9 10*3/uL (ref 3.4–10.8)

## 2016-07-19 LAB — CYSTIC FIBROSIS MUTATION 97: GENE DIS ANAL CARRIER INTERP BLD/T-IMP: NOT DETECTED

## 2016-07-19 LAB — VARICELLA ZOSTER ANTIBODY, IGG: Varicella zoster IgG: 692 index (ref >165)

## 2016-07-19 LAB — HEMOGLOBINOPATHY EVALUATION
HEMOGLOBIN A2 QUANTITATION: 2.5 % (ref 1.8–3.2)
HGB A: 97.5 % (ref 96.4–98.8)
HGB C: 0 %
HGB S: 0 %
Hemoglobin F Quantitation: 0 % (ref 0.0–2.0)

## 2016-07-19 LAB — HEMOGLOBIN A1C
ESTIMATED AVERAGE GLUCOSE: 91 mg/dL
HEMOGLOBIN A1C: 4.8 % (ref 4.8–5.6)

## 2016-07-19 LAB — TSH: TSH: 0.769 u[IU]/mL (ref 0.450–4.500)

## 2016-07-20 LAB — TOXASSURE SELECT 13 (MW), URINE

## 2016-07-21 ENCOUNTER — Other Ambulatory Visit: Payer: Self-pay | Admitting: Certified Nurse Midwife

## 2016-07-21 DIAGNOSIS — Z3400 Encounter for supervision of normal first pregnancy, unspecified trimester: Secondary | ICD-10-CM

## 2016-07-21 LAB — MATERNIT21 PLUS CORE+SCA
Chromosome 13: NEGATIVE
Chromosome 18: NEGATIVE
Chromosome 21: NEGATIVE
PDF: 0
Y CHROMOSOME: NOT DETECTED

## 2016-07-25 ENCOUNTER — Encounter: Payer: Medicaid Other | Admitting: Obstetrics and Gynecology

## 2016-08-10 ENCOUNTER — Ambulatory Visit (INDEPENDENT_AMBULATORY_CARE_PROVIDER_SITE_OTHER): Payer: Medicaid Other | Admitting: Certified Nurse Midwife

## 2016-08-10 VITALS — BP 120/80 | HR 110 | Wt 123.0 lb

## 2016-08-10 DIAGNOSIS — Z3400 Encounter for supervision of normal first pregnancy, unspecified trimester: Secondary | ICD-10-CM

## 2016-08-10 DIAGNOSIS — Z3402 Encounter for supervision of normal first pregnancy, second trimester: Secondary | ICD-10-CM

## 2016-08-10 NOTE — Progress Notes (Signed)
   PRENATAL VISIT NOTE  Subjective:  Haley Potts is a 21 y.o. G1P0000 at 7376w1d being seen today for ongoing prenatal care.  She is currently monitored for the following issues for this low-risk pregnancy and has Supervision of normal pregnancy, antepartum on her problem list.  Patient reports backache, no bleeding, no contractions, no cramping and no leaking.  Contractions: Not present. Vag. Bleeding: None.  Movement: Absent. Denies leaking of fluid.   The following portions of the patient's history were reviewed and updated as appropriate: allergies, current medications, past family history, past medical history, past social history, past surgical history and problem list. Problem list updated.  Objective:   Vitals:   08/10/16 1338  BP: 120/80  Pulse: (!) 110  Weight: 123 lb (55.8 kg)    Fetal Status: Fetal Heart Rate (bpm): 138   Movement: Absent     General:  Alert, oriented and cooperative. Patient is in no acute distress.  Skin: Skin is warm and dry. No rash noted.   Cardiovascular: Normal heart rate noted  Respiratory: Normal respiratory effort, no problems with respiration noted  Abdomen: Soft, gravid, appropriate for gestational age. Pain/Pressure: Absent     Pelvic:  Cervical exam deferred        Extremities: Normal range of motion.     Mental Status: Normal mood and affect. Normal behavior. Normal judgment and thought content.   Assessment and Plan:  Pregnancy: G1P0000 at 5276w1d  1. Supervision of normal first pregnancy, antepartum      Normal discomforts of pregnancy - AFP, Serum, Open Spina Bifida  Preterm labor symptoms and general obstetric precautions including but not limited to vaginal bleeding, contractions, leaking of fluid and fetal movement were reviewed in detail with the patient. Please refer to After Visit Summary for other counseling recommendations.  Return in about 4 weeks (around 09/07/2016) for ROB.   Roe Coombsachelle A Arieonna Medine, CNM

## 2016-08-10 NOTE — Progress Notes (Signed)
Pt states some cramping at night.

## 2016-08-10 NOTE — Patient Instructions (Addendum)
Second Trimester of Pregnancy The second trimester is from week 13 through week 28 (months 4 through 6). The second trimester is often a time when you feel your best. Your body has also adjusted to being pregnant, and you begin to feel better physically. Usually, morning sickness has lessened or quit completely, you may have more energy, and you may have an increase in appetite. The second trimester is also a time when the fetus is growing rapidly. At the end of the sixth month, the fetus is about 9 inches long and weighs about 1 pounds. You will likely begin to feel the baby move (quickening) between 18 and 20 weeks of the pregnancy. Body changes during your second trimester Your body continues to go through many changes during your second trimester. The changes vary from woman to woman.  Your weight will continue to increase. You will notice your lower abdomen bulging out.  You may begin to get stretch marks on your hips, abdomen, and breasts.  You may develop headaches that can be relieved by medicines. The medicines should be approved by your health care provider.  You may urinate more often because the fetus is pressing on your bladder.  You may develop or continue to have heartburn as a result of your pregnancy.  You may develop constipation because certain hormones are causing the muscles that push waste through your intestines to slow down.  You may develop hemorrhoids or swollen, bulging veins (varicose veins).  You may have back pain. This is caused by:  Weight gain.  Pregnancy hormones that are relaxing the joints in your pelvis.  A shift in weight and the muscles that support your balance.  Your breasts will continue to grow and they will continue to become tender.  Your gums may bleed and may be sensitive to brushing and flossing.  Dark spots or blotches (chloasma, mask of pregnancy) may develop on your face. This will likely fade after the baby is born.  A dark line  from your belly button to the pubic area (linea nigra) may appear. This will likely fade after the baby is born.  You may have changes in your hair. These can include thickening of your hair, rapid growth, and changes in texture. Some women also have hair loss during or after pregnancy, or hair that feels dry or thin. Your hair will most likely return to normal after your baby is born. What to expect at prenatal visits During a routine prenatal visit:  You will be weighed to make sure you and the fetus are growing normally.  Your blood pressure will be taken.  Your abdomen will be measured to track your baby's growth.  The fetal heartbeat will be listened to.  Any test results from the previous visit will be discussed. Your health care provider may ask you:  How you are feeling.  If you are feeling the baby move.  If you have had any abnormal symptoms, such as leaking fluid, bleeding, severe headaches, or abdominal cramping.  If you are using any tobacco products, including cigarettes, chewing tobacco, and electronic cigarettes.  If you have any questions. Other tests that may be performed during your second trimester include:  Blood tests that check for:  Low iron levels (anemia).  Gestational diabetes (between 24 and 28 weeks).  Rh antibodies. This is to check for a protein on red blood cells (Rh factor).  Urine tests to check for infections, diabetes, or protein in the urine.  An ultrasound to   confirm the proper growth and development of the baby.  An amniocentesis to check for possible genetic problems.  Fetal screens for spina bifida and Down syndrome.  HIV (human immunodeficiency virus) testing. Routine prenatal testing includes screening for HIV, unless you choose not to have this test. Follow these instructions at home: Eating and drinking  Continue to eat regular, healthy meals.  Avoid raw meat, uncooked cheese, cat litter boxes, and soil used by cats. These  carry germs that can cause birth defects in the baby.  Take your prenatal vitamins.  Take 1500-2000 mg of calcium daily starting at the 20th week of pregnancy until you deliver your baby.  If you develop constipation:  Take over-the-counter or prescription medicines.  Drink enough fluid to keep your urine clear or pale yellow.  Eat foods that are high in fiber, such as fresh fruits and vegetables, whole grains, and beans.  Limit foods that are high in fat and processed sugars, such as fried and sweet foods. Activity  Exercise only as directed by your health care provider. Experiencing uterine cramps is a good sign to stop exercising.  Avoid heavy lifting, wear low heel shoes, and practice good posture.  Wear your seat belt at all times when driving.  Rest with your legs elevated if you have leg cramps or low back pain.  Wear a good support bra for breast tenderness.  Do not use hot tubs, steam rooms, or saunas. Lifestyle  Avoid all smoking, herbs, alcohol, and unprescribed drugs. These chemicals affect the formation and growth of the baby.  Do not use any products that contain nicotine or tobacco, such as cigarettes and e-cigarettes. If you need help quitting, ask your health care provider.  A sexual relationship may be continued unless your health care provider directs you otherwise. General instructions  Follow your health care provider's instructions regarding medicine use. There are medicines that are either safe or unsafe to take during pregnancy.  Take warm sitz baths to soothe any pain or discomfort caused by hemorrhoids. Use hemorrhoid cream if your health care provider approves.  If you develop varicose veins, wear support hose. Elevate your feet for 15 minutes, 3-4 times a day. Limit salt in your diet.  Visit your dentist if you have not gone yet during your pregnancy. Use a soft toothbrush to brush your teeth and be gentle when you floss.  Keep all follow-up  prenatal visits as told by your health care provider. This is important. Contact a health care provider if:  You have dizziness.  You have mild pelvic cramps, pelvic pressure, or nagging pain in the abdominal area.  You have persistent nausea, vomiting, or diarrhea.  You have a bad smelling vaginal discharge.  You have pain with urination. Get help right away if:  You have a fever.  You are leaking fluid from your vagina.  You have spotting or bleeding from your vagina.  You have severe abdominal cramping or pain.  You have rapid weight gain or weight loss.  You have shortness of breath with chest pain.  You notice sudden or extreme swelling of your face, hands, ankles, feet, or legs.  You have not felt your baby move in over an hour.  You have severe headaches that do not go away with medicine.  You have vision changes. Summary  The second trimester is from week 13 through week 28 (months 4 through 6). It is also a time when the fetus is growing rapidly.  Your body goes   through many changes during pregnancy. The changes vary from woman to woman.  Avoid all smoking, herbs, alcohol, and unprescribed drugs. These chemicals affect the formation and growth your baby.  Do not use any tobacco products, such as cigarettes, chewing tobacco, and e-cigarettes. If you need help quitting, ask your health care provider.  Contact your health care provider if you have any questions. Keep all prenatal visits as told by your health care provider. This is important. This information is not intended to replace advice given to you by your health care provider. Make sure you discuss any questions you have with your health care provider. Document Released: 06/14/2001 Document Revised: 11/26/2015 Document Reviewed: 08/21/2012 [employer] Interactive Patient Education  2017 [employer]  

## 2016-08-16 ENCOUNTER — Other Ambulatory Visit: Payer: Self-pay | Admitting: Certified Nurse Midwife

## 2016-08-16 ENCOUNTER — Ambulatory Visit (HOSPITAL_COMMUNITY)
Admission: RE | Admit: 2016-08-16 | Discharge: 2016-08-16 | Disposition: A | Discharge status: Home / Self Care | Payer: Medicaid Other | Source: Ambulatory Visit | Attending: Certified Nurse Midwife | Admitting: Certified Nurse Midwife

## 2016-08-16 DIAGNOSIS — Z3A18 18 weeks gestation of pregnancy: Secondary | ICD-10-CM

## 2016-08-16 DIAGNOSIS — Z3689 Encounter for other specified antenatal screening: Secondary | ICD-10-CM

## 2016-08-16 DIAGNOSIS — Z23 Encounter for immunization: Secondary | ICD-10-CM

## 2016-08-16 DIAGNOSIS — Z3490 Encounter for supervision of normal pregnancy, unspecified, unspecified trimester: Secondary | ICD-10-CM

## 2016-08-17 ENCOUNTER — Other Ambulatory Visit: Payer: Self-pay | Admitting: Certified Nurse Midwife

## 2016-08-17 DIAGNOSIS — Z3400 Encounter for supervision of normal first pregnancy, unspecified trimester: Secondary | ICD-10-CM

## 2016-08-17 LAB — AFP, SERUM, OPEN SPINA BIFIDA
AFP MOM: 1
AFP Value: 47.7 ng/mL
Gest. Age on Collection Date: 17.1 weeks
Maternal Age At EDD: 20 years
OSBR RISK 1 IN: 10000
Test Results:: NEGATIVE
WEIGHT: 123 [lb_av]

## 2016-09-07 ENCOUNTER — Ambulatory Visit (INDEPENDENT_AMBULATORY_CARE_PROVIDER_SITE_OTHER): Payer: Medicaid Other | Admitting: Certified Nurse Midwife

## 2016-09-07 ENCOUNTER — Encounter: Payer: Self-pay | Admitting: Certified Nurse Midwife

## 2016-09-07 VITALS — BP 115/72 | HR 98 | Wt 129.0 lb

## 2016-09-07 DIAGNOSIS — Z3402 Encounter for supervision of normal first pregnancy, second trimester: Secondary | ICD-10-CM

## 2016-09-07 DIAGNOSIS — Z3400 Encounter for supervision of normal first pregnancy, unspecified trimester: Secondary | ICD-10-CM

## 2016-09-07 NOTE — Progress Notes (Signed)
Patient has right sided pain.

## 2016-09-07 NOTE — Progress Notes (Signed)
   PRENATAL VISIT NOTE  Subjective:  Haley Potts is a 21 y.o. G1P0000 at 7423w1d being seen today for ongoing prenatal care.  She is currently monitored for the following issues for this low-risk pregnancy and has Supervision of normal pregnancy, antepartum on her problem list.  Patient reports no complaints.  Contractions: Not present. Vag. Bleeding: None.  Movement: Present. Denies leaking of fluid.   The following portions of the patient's history were reviewed and updated as appropriate: allergies, current medications, past family history, past medical history, past social history, past surgical history and problem list. Problem list updated.  Objective:   Vitals:   09/07/16 1305  BP: 115/72  Pulse: 98  Weight: 129 lb (58.5 kg)    Fetal Status:     Movement: Present     General:  Alert, oriented and cooperative. Patient is in no acute distress.  Skin: Skin is warm and dry. No rash noted.   Cardiovascular: Normal heart rate noted  Respiratory: Normal respiratory effort, no problems with respiration noted  Abdomen: Soft, gravid, appropriate for gestational age. Pain/Pressure: Absent     Pelvic:  Cervical exam deferred        Extremities: Normal range of motion.  Edema: None  Mental Status: Normal mood and affect. Normal behavior. Normal judgment and thought content.   Assessment and Plan:  Pregnancy: G1P0000 at 4323w1d  1. Supervision of normal first pregnancy, antepartum     Doing well  Preterm labor symptoms and general obstetric precautions including but not limited to vaginal bleeding, contractions, leaking of fluid and fetal movement were reviewed in detail with the patient. Please refer to After Visit Summary for other counseling recommendations.  Return in about 4 weeks (around 10/05/2016) for ROB.   Lynnell JudeJade C Katrese Shell, FNP

## 2016-10-05 ENCOUNTER — Ambulatory Visit (INDEPENDENT_AMBULATORY_CARE_PROVIDER_SITE_OTHER): Payer: Medicaid Other | Admitting: Certified Nurse Midwife

## 2016-10-05 ENCOUNTER — Telehealth: Payer: Self-pay | Admitting: *Deleted

## 2016-10-05 VITALS — BP 111/70 | HR 85 | Wt 136.8 lb

## 2016-10-05 DIAGNOSIS — Z3402 Encounter for supervision of normal first pregnancy, second trimester: Secondary | ICD-10-CM

## 2016-10-05 DIAGNOSIS — Z3400 Encounter for supervision of normal first pregnancy, unspecified trimester: Secondary | ICD-10-CM

## 2016-10-05 DIAGNOSIS — O219 Vomiting of pregnancy, unspecified: Secondary | ICD-10-CM

## 2016-10-05 MED ORDER — DOXYLAMINE-PYRIDOXINE 10-10 MG PO TBEC: DELAYED_RELEASE_TABLET | ORAL | 4 refills | Status: DC

## 2016-10-05 NOTE — Progress Notes (Signed)
   PRENATAL VISIT NOTE  Subjective:  Haley Potts is a 21 y.o. G1P0000 at [redacted]w[redacted]d being seen today for ongoing prenatal care.  She is currently monitored for the following issues for this low-risk pregnancy and has Supervision of normal pregnancy, antepartum on her problem list.  Patient reports nausea.  Contractions: Not present. Vag. Bleeding: None.  Movement: Present. Denies leaking of fluid.   The following portions of the patient's history were reviewed and updated as appropriate: allergies, current medications, past family history, past medical history, past social history, past surgical history and problem list. Problem list updated.  Objective:   Vitals:   10/05/16 1309  BP: 111/70  Pulse: 85  Weight: 136 lb 12.8 oz (62.1 kg)    Fetal Status:     Movement: Present     General:  Alert, oriented and cooperative. Patient is in no acute distress.  Skin: Skin is warm and dry. No rash noted.   Cardiovascular: Normal heart rate noted  Respiratory: Normal respiratory effort, no problems with respiration noted  Abdomen: Soft, gravid, appropriate for gestational age. Pain/Pressure: Absent     Pelvic:  Cervical exam deferred        Extremities: Normal range of motion.     Mental Status: Normal mood and affect. Normal behavior. Normal judgment and thought content.   Assessment and Plan:  Pregnancy: G1P0000 at [redacted]w[redacted]d  1. Nausea and vomiting during pregnancy Encouraged small frequent meals and to avoid spicy food.  - Doxylamine-Pyridoxine (DICLEGIS) 10-10 MG TBEC; Take 1 tablet with breakfast and lunch.  Take 2 tablets at bedtime.  Dispense: 100 tablet; Refill: 4  2. Supervision of normal first pregnancy, antepartum Patient is doing well    Preterm labor symptoms and general obstetric precautions including but not limited to vaginal bleeding, contractions, leaking of fluid and fetal movement were reviewed in detail with the patient. Please refer to After Visit Summary for other  counseling recommendations.  Return in about 3 weeks for ROB, 2hr gtt for next scheduled visit, Counseled patient nothing to eat or drink after midnight except water for the test  [employer], Student-MidWife

## 2016-10-05 NOTE — Progress Notes (Signed)
Pt states that Zofran is not helping with nausea.  Pt states nausea is occasional.

## 2016-10-05 NOTE — Patient Instructions (Signed)
AREA PEDIATRIC/FAMILY PRACTICE PHYSICIANS  [facility] [address], [address] [place], [place]  [address] Phone - [phone]   Fax - [phone]  [facility] [address] [place], [place] [address] Phone - [phone]   Fax - [phone]  [facility] [address] [place], [place]  [address] Phone - [phone]   Fax - [phone]  [facility] [address] [place], [place]  [address] Phone - [phone]   Fax - [phone]  [facility] [address] [place], [place]  [address] Phone - [phone]   Fax - [phone]  [facility] [address] [place], [place]  [address] Phone - [phone]   Fax - [phone]  [facility] [address] [place], [place]  [address] Phone - [phone]   Fax - [phone]  [facility] [address] [place], [place]  [address] Phone - [phone]   Fax - [phone]  [facility] [address] [place], [place]  [address] Phone - [phone]   Fax - [phone] [facility] [address] [place], [place]  [address] Phone - [phone]   Fax - [phone]  [facility] [address] [place], [place]  [address] Phone - [phone]   Fax - [phone]  [facility] [address] [place], [place]  [address] Phone - [phone]   Fax - [phone]  [facility] [address], [address] [place], [place]  [address] Phone - [phone]   Fax - [phone]  [facility] [address] [place], [place]  [address] Phone - [phone]  [facility] [address] [place], [place]  [address] Phone - [phone]   Fax - [phone]  [facility] [address] [place], [place]  [address] Phone - [phone]   Fax - [phone]  [facility] [address] [place], [place]  [address] Phone - [phone]   Fax - [phone]  [facility] [address] [place], [place]  [address] Phone - [phone]   Fax - [phone]   [facility] [address] [place], [place]  [address] Phone - [phone]   Fax - [phone]  [facility] [address] [place], [place]  [address] Phone - [phone]   Fax - [phone]  [facility] [address] [place], [place]  [address] Phone - [phone]   Fax - [phone]  [facility] [address] , [place]  [address] Phone - [phone]   Fax - [phone]  [facility] [address] , [place]  [address] Phone - [phone]   Fax - [phone] [facility] [address] [place], [place]  [address] Phone - [phone]   Fax - [phone]  [facility] [address] [place], [place]  [address] Phone - [phone]   Fax - [phone]   [facility] [place] [address] [place],   [address] Phone - [phone]   Fax - [phone]  [facility] [place] Charlene Flemming MD [address] [place]  [address] Phone [phone]  Fax [phone]   

## 2016-10-05 NOTE — Telephone Encounter (Signed)
PA for diclegis submitted and approved. Approval faxed to pharmacy.

## 2016-10-10 ENCOUNTER — Telehealth: Payer: Self-pay | Admitting: *Deleted

## 2016-10-10 NOTE — Telephone Encounter (Signed)
Call placed to [facility] for Diclegis PA. Submitted and approved.  Pharmacy notified.

## 2016-10-26 ENCOUNTER — Encounter: Payer: Self-pay | Admitting: Certified Nurse Midwife

## 2016-10-26 ENCOUNTER — Other Ambulatory Visit: Payer: Medicaid Other

## 2016-10-26 ENCOUNTER — Ambulatory Visit (INDEPENDENT_AMBULATORY_CARE_PROVIDER_SITE_OTHER): Payer: Medicaid Other | Admitting: Certified Nurse Midwife

## 2016-10-26 VITALS — BP 105/68 | HR 85 | Wt 142.0 lb

## 2016-10-26 DIAGNOSIS — Z3403 Encounter for supervision of normal first pregnancy, third trimester: Secondary | ICD-10-CM

## 2016-10-26 DIAGNOSIS — Z3400 Encounter for supervision of normal first pregnancy, unspecified trimester: Secondary | ICD-10-CM

## 2016-10-26 NOTE — Progress Notes (Signed)
Patient threw up at the 1 Hr. Mark, per [place] the test was continued.

## 2016-10-26 NOTE — Addendum Note (Signed)
Addended by: Maretta Bees on: 10/26/2016 11:17 AM   Modules accepted: Orders

## 2016-10-26 NOTE — Progress Notes (Signed)
Patient declined TDAP 10/26/16

## 2016-10-26 NOTE — Progress Notes (Signed)
   PRENATAL VISIT NOTE  Subjective:  Haley Potts is a 21 y.o. G1P0000 at [redacted]w[redacted]d being seen today for ongoing prenatal care.  She is currently monitored for the following issues for this low-risk pregnancy and has Supervision of normal pregnancy, antepartum on her problem list.  Patient reports no complaints.  Contractions: Not present. Vag. Bleeding: None.  Movement: Present. Denies leaking of fluid.   The following portions of the patient's history were reviewed and updated as appropriate: allergies, current medications, past family history, past medical history, past social history, past surgical history and problem list. Problem list updated.  Objective:   Vitals:   10/26/16 0843  BP: 105/68  Pulse: 85  Weight: 142 lb (64.4 kg)    Fetal Status: Fetal Heart Rate (bpm): 145 Fundal Height: 27 cm Movement: Present     General:  Alert, oriented and cooperative. Patient is in no acute distress.  Skin: Skin is warm and dry. No rash noted.   Cardiovascular: Normal heart rate noted  Respiratory: Normal respiratory effort, no problems with respiration noted  Abdomen: Soft, gravid, appropriate for gestational age. Pain/Pressure: Present     Pelvic:  Cervical exam deferred        Extremities: Normal range of motion.  Edema: None  Mental Status: Normal mood and affect. Normal behavior. Normal judgment and thought content.   Assessment and Plan:  Pregnancy: G1P0000 at [redacted]w[redacted]d  1. Supervision of normal first pregnancy, antepartum     Doing well - Glucose Tolerance, 2 Hours w/1 Hour - CBC - HIV antibody (with reflex) - RPR  Preterm labor symptoms and general obstetric precautions including but not limited to vaginal bleeding, contractions, leaking of fluid and fetal movement were reviewed in detail with the patient. Please refer to After Visit Summary for other counseling recommendations.  Return in about 2 weeks (around 11/09/2016) for ROB.   Roe Coombs, CNM

## 2016-10-27 ENCOUNTER — Other Ambulatory Visit: Payer: Self-pay | Admitting: Certified Nurse Midwife

## 2016-10-27 DIAGNOSIS — Z3400 Encounter for supervision of normal first pregnancy, unspecified trimester: Secondary | ICD-10-CM

## 2016-10-27 LAB — CBC
Hematocrit: 31.2 % — ABNORMAL LOW (ref 34.0–46.6)
Hemoglobin: 11 g/dL — ABNORMAL LOW (ref 11.1–15.9)
MCH: 31.3 pg (ref 26.6–33.0)
MCHC: 35.3 g/dL (ref 31.5–35.7)
MCV: 89 fL (ref 79–97)
PLATELETS: 243 10*3/uL (ref 150–379)
RBC: 3.52 x10E6/uL — ABNORMAL LOW (ref 3.77–5.28)
RDW: 13.1 % (ref 12.3–15.4)
WBC: 8.2 10*3/uL (ref 3.4–10.8)

## 2016-10-27 LAB — HIV ANTIBODY (ROUTINE TESTING W REFLEX): HIV Screen 4th Generation wRfx: NONREACTIVE

## 2016-10-27 LAB — GLUCOSE TOLERANCE, 2 HOURS W/ 1HR
GLUCOSE, FASTING: 71 mg/dL (ref 65–91)
Glucose, 1 hour: 106 mg/dL (ref 65–179)
Glucose, 2 hour: 58 mg/dL — ABNORMAL LOW (ref 65–152)

## 2016-10-27 LAB — RPR: RPR Ser Ql: NONREACTIVE

## 2016-11-09 ENCOUNTER — Ambulatory Visit (INDEPENDENT_AMBULATORY_CARE_PROVIDER_SITE_OTHER): Payer: Medicaid Other | Admitting: Certified Nurse Midwife

## 2016-11-09 DIAGNOSIS — Z3400 Encounter for supervision of normal first pregnancy, unspecified trimester: Secondary | ICD-10-CM

## 2016-11-09 DIAGNOSIS — Z3403 Encounter for supervision of normal first pregnancy, third trimester: Secondary | ICD-10-CM

## 2016-11-09 NOTE — Progress Notes (Signed)
   PRENATAL VISIT NOTE  Subjective:  Haley Potts is a 21 y.o. G1P0000 at 3842w1d being seen today for ongoing prenatal care.  She is currently monitored for the following issues for this low-risk pregnancy and has Supervision of normal pregnancy, antepartum on her problem list.  Patient reports no complaints.  Contractions: Not present. Vag. Bleeding: None.  Movement: Present. Denies leaking of fluid.   The following portions of the patient's history were reviewed and updated as appropriate: allergies, current medications, past family history, past medical history, past social history, past surgical history and problem list. Problem list updated.  Objective:   Vitals:   11/09/16 1355  BP: 129/71  Pulse: 97  Weight: 141 lb (64 kg)    Fetal Status: Fetal Heart Rate (bpm): 151 Fundal Height: 29 cm Movement: Present     General:  Alert, oriented and cooperative. Patient is in no acute distress.  Skin: Skin is warm and dry. No rash noted.   Cardiovascular: Normal heart rate noted  Respiratory: Normal respiratory effort, no problems with respiration noted  Abdomen: Soft, gravid, appropriate for gestational age. Pain/Pressure: Absent     Pelvic:  Cervical exam deferred        Extremities: Normal range of motion.  Edema: None  Mental Status: Normal mood and affect. Normal behavior. Normal judgment and thought content.   Assessment and Plan:  Pregnancy: G1P0000 at 3442w1d  1. Supervision of normal first pregnancy, antepartum Patient doing well, Encouraged adequate water intake.  Preterm labor symptoms and general obstetric precautions including but not limited to vaginal bleeding, contractions, leaking of fluid and fetal movement were reviewed in detail with the patient. Please refer to After Visit Summary for other counseling recommendations.  2wk ROB  Denney, Rachelle A, CNM

## 2016-11-09 NOTE — Progress Notes (Signed)
Patient reports good fetal movement, denies pain/contractions. 

## 2016-11-09 NOTE — Patient Instructions (Signed)
AREA PEDIATRIC/FAMILY PRACTICE PHYSICIANS  [facility] [address] [place], [place]  [address] Phone - [phone]   Fax - [phone]  [facility] [address] [place], [place] [address] Phone - [phone]   Fax - [phone]  [facility] [address] [place], [place]  [address] Phone - [phone]   Fax - [phone]  [facility] [address] [place], [place]  [address] Phone - [phone]   Fax - [phone]  [facility] [address] [place], [place]  [address] Phone - [phone]   Fax - [phone]  [facility] [address] [place], [place]  [address] Phone - [phone]   Fax - [phone]  [facility] [address] [place], [place]  [address] Phone - [phone]   Fax - [phone]  [facility] [address] [place], [place]  [address] Phone - [phone]   Fax - [phone]  [facility] [address] [place], [place]  [address] Phone - [phone]   Fax - [phone] [facility] [address] [place], [place]  [address] Phone - [phone]   Fax - [phone]  [facility] [address] [place], [place]  [address] Phone - [phone]   Fax - [phone]  [facility] [address] [place], [place]  [address] Phone - [phone]   Fax - [phone]  [facility] [address] [place], [place]  [address] Phone - [phone]   Fax - [phone]  [facility] [address] [place], [place]  [address] Phone - [phone]  [facility] [address] [place], [place]  [address] Phone - [phone]   Fax - [phone]  [facility] [address] [place], [place]  [address] Phone - [phone]   Fax - [phone]  [facility] [address] [place], [place]  [address] Phone - [phone]   Fax - [phone]  [facility] [address] [place], [place]  [address] Phone - [phone]   Fax - [phone]   [facility] [address] [place], [place]  [address] Phone - [phone]   Fax - [phone]  [facility] [address] [place], [place]  [address] Phone - [phone]   Fax - [phone]  [facility] [address] [place], [place]  [address] Phone - [phone]   Fax - [phone]  [facility] [address] [place], [place]  [address] Phone - [phone]   Fax - [phone]  [facility] [address] [place], [place]  [address] Phone - [phone]   Fax - [phone] [facility] [address] [place], [place] [employer] [place]  [address] Phone - [phone]   Fax - [phone]  [facility] [address] [place], [place]  [address] Phone - [phone]   Fax - [phone]  [facility] [address] [place], [place]  [address] Phone - [phone]   Fax - [phone]  [facility] [place] Charlene Flemming MD [address] [place] [address] Phone [phone]  Fax [phone]   

## 2016-11-23 ENCOUNTER — Ambulatory Visit (INDEPENDENT_AMBULATORY_CARE_PROVIDER_SITE_OTHER): Payer: Medicaid Other | Admitting: Certified Nurse Midwife

## 2016-11-23 ENCOUNTER — Encounter: Payer: Self-pay | Admitting: Certified Nurse Midwife

## 2016-11-23 VITALS — BP 99/64 | HR 62 | Wt 142.0 lb

## 2016-11-23 DIAGNOSIS — Z3400 Encounter for supervision of normal first pregnancy, unspecified trimester: Secondary | ICD-10-CM

## 2016-11-23 DIAGNOSIS — Z3403 Encounter for supervision of normal first pregnancy, third trimester: Secondary | ICD-10-CM

## 2016-11-23 NOTE — Progress Notes (Signed)
   PRENATAL VISIT NOTE  Subjective:  Haley Potts is a 21 y.o. G1P0000 at 8029w1d being seen today for ongoing prenatal care.  She is currently monitored for the following issues for this low-risk pregnancy and has Supervision of normal pregnancy, antepartum on her problem list.  Patient reports no complaints.  Contractions: Not present. Vag. Bleeding: None.  Movement: Present. Denies leaking of fluid.   The following portions of the patient's history were reviewed and updated as appropriate: allergies, current medications, past family history, past medical history, past social history, past surgical history and problem list. Problem list updated.  Objective:   Vitals:   11/23/16 1426  BP: 99/64  Pulse: 62  Weight: 142 lb (64.4 kg)    Fetal Status: Fetal Heart Rate (bpm): 135 Fundal Height: 31 cm Movement: Present     General:  Alert, oriented and cooperative. Patient is in no acute distress.  Skin: Skin is warm and dry. No rash noted.   Cardiovascular: Normal heart rate noted  Respiratory: Normal respiratory effort, no problems with respiration noted  Abdomen: Soft, gravid, appropriate for gestational age. Pain/Pressure: Present     Pelvic:  Cervical exam deferred        Extremities: Normal range of motion.     Mental Status: Normal mood and affect. Normal behavior. Normal judgment and thought content.   Assessment and Plan:  Pregnancy: G1P0000 at 229w1d  1. Supervision of normal first pregnancy, antepartum      Doing well  Preterm labor symptoms and general obstetric precautions including but not limited to vaginal bleeding, contractions, leaking of fluid and fetal movement were reviewed in detail with the patient. Please refer to After Visit Summary for other counseling recommendations.  Return in about 2 weeks (around 12/07/2016) for ROB.   Roe Coombsachelle A Isaah Furry, CNM

## 2016-11-23 NOTE — Patient Instructions (Signed)
AREA PEDIATRIC/FAMILY PRACTICE PHYSICIANS   [facility] [address] [place], [place]  [address] Phone - [phone]   Fax - [phone]  [facility] [address] [place], [place] [address] Phone - [phone]   Fax - [phone]  [facility] [address] [place], [place]  [address] Phone - [phone]   Fax - [phone]  [facility] [address] [place], [place]  [address] Phone - [phone]   Fax - [phone]  [facility] [address] [place], [place]  [address] Phone - [phone]   Fax - [phone]  [facility] [address] [place], [place]  [address] Phone - [phone]   Fax - [phone]  [facility] [address] [place], [place]  [address] Phone - [phone]   Fax - [phone]  [facility] [address] [place], [place]  [address] Phone - [phone]   Fax - [phone]  [facility] [address] [place], [place]  [address] Phone - [phone]   Fax - [phone] [facility] [address] [place], [place]  [address] Phone - [phone]   Fax - [phone]  [facility] [address] [place], [place]  [address] Phone - [phone]   Fax - [phone]  [facility] [address] [place], [place]  [address] Phone - [phone]   Fax - [phone]  [facility] [address] [place], [place]  [address] Phone - [phone]   Fax - [phone]  [facility] GOSR[facility] [address] [place], [place]  [address] Phone - [phone]  [facility] [address] [place], [place]  [address] Phone - [phone]   Fax - [phone]  [facility] [address] [place], [place]  [address] Phone - [phone]   Fax - [phone]  [facility] [address] [place], [place]  [address] Phone - [phone]   Fax - [phone]  [facility] [address] [place], [place]  [address] Phone - [phone]   Fax - [phone]   [facility] [address] [place], [place]  [address] Phone - [phone]   Fax - [phone]  [facility] [address] [place], [place]  [address] Phone - [phone]   Fax - [phone]  [facility] [address] [place], [place]  [address] Phone - [phone]   Fax - [phone]  [facility] [address] [place], [place]  [address] Phone - [phone]   Fax - [phone]  [facility] [address] , [place]  [address] Phone - [phone]   Fax - [phone] [facility] [address] [place], [place]  [address] Phone - [phone]   Fax - [phone]  [facility] [address] [place], [place]  [address] Phone - [phone]   Fax - [phone]   [facility] [place] [address] , [place]  [address] Phone - [phone]   Fax - [phone]  [facility] [place] Charlene Flemming MD [address] [place]  [address] Phone [phone]  Fax [phone]   

## 2016-12-07 ENCOUNTER — Ambulatory Visit (INDEPENDENT_AMBULATORY_CARE_PROVIDER_SITE_OTHER): Payer: Medicaid Other | Admitting: Certified Nurse Midwife

## 2016-12-07 DIAGNOSIS — Z3483 Encounter for supervision of other normal pregnancy, third trimester: Secondary | ICD-10-CM

## 2016-12-07 DIAGNOSIS — Z3400 Encounter for supervision of normal first pregnancy, unspecified trimester: Secondary | ICD-10-CM

## 2016-12-07 NOTE — Progress Notes (Signed)
Patient is in the office, reports good fetal movement and contractions that come and go. 

## 2016-12-07 NOTE — Progress Notes (Signed)
   PRENATAL VISIT NOTE  Subjective:  Haley Potts is a 21 y.o. G1P0000 at 6565w1d being seen today for ongoing prenatal care.  She is currently monitored for the following issues for this low-risk pregnancy and has Supervision of normal pregnancy, antepartum on her problem list.  Patient reports no complaints.  Contractions: Irregular. Vag. Bleeding: None.  Movement: Present. Denies leaking of fluid.   The following portions of the patient's history were reviewed and updated as appropriate: allergies, current medications, past family history, past medical history, past social history, past surgical history and problem list. Problem list updated.  Objective:   Vitals:   12/07/16 1308  BP: 109/72  Pulse: 82  Weight: 145 lb 14.4 oz (66.2 kg)    Fetal Status: Fetal Heart Rate (bpm): 145 Fundal Height: 34 cm Movement: Present     General:  Alert, oriented and cooperative. Patient is in no acute distress.  Skin: Skin is warm and dry. No rash noted.   Cardiovascular: Normal heart rate noted  Respiratory: Normal respiratory effort, no problems with respiration noted  Abdomen: Soft, gravid, appropriate for gestational age. Pain/Pressure: Present     Pelvic:  Cervical exam deferred        Extremities: Normal range of motion.  Edema: None  Mental Status: Normal mood and affect. Normal behavior. Normal judgment and thought content.   Assessment and Plan:  Pregnancy: G1P0000 at 1765w1d  1. Supervision of normal first pregnancy, antepartum     Doing well.  Preterm labor symptoms and general obstetric precautions including but not limited to vaginal bleeding, contractions, leaking of fluid and fetal movement were reviewed in detail with the patient. Please refer to After Visit Summary for other counseling recommendations.  Return in about 1 week (around 12/14/2016) for ROB, GBS.   Roe Coombsachelle A Dezaray Shibuya, CNM

## 2016-12-07 NOTE — Patient Instructions (Signed)
AREA PEDIATRIC/FAMILY PRACTICE PHYSICIANS  [facility] [address], [address] [place], [place]  [address] Phone - [phone]   Fax - [phone]  [facility] [address] [place], [place] [address] Phone - [phone]   Fax - [phone]  [facility] [address] [place], [place]  [address] Phone - [phone]   Fax - [phone]  [facility] [address] [place], [place]  [address] Phone - [phone]   Fax - [phone]  [facility] [address] [place], [place]  [address] Phone - [phone]   Fax - [phone]  [facility] [address] [place], [place]  [address] Phone - [phone]   Fax - [phone]  [facility] [address] [place], [place]  [address] Phone - [phone]   Fax - [phone]  [facility] [address] [place], [place]  [address] Phone - [phone]   Fax - [phone]  [facility] [address] [place], [place]  [address] Phone - [phone]   Fax - [phone] [facility] [address] [place], [place]  [address] Phone - [phone]   Fax - [phone]  [facility] [address] [place], [place]  [address] Phone - [phone]   Fax - [phone]  [facility] [address] [place], [place]  [address] Phone - [phone]   Fax - [phone]  [facility] [address] [place]  [address] Phone - [phone]   Fax - [phone]  [facility]OSR[facility] [address] [place], [place]  [address] Phone - [phone]  [facility] [address] [place], [place]  [address] Phone - [phone]   Fax - [phone]  [facility] [address] [place], [place]  [address] Phone - [phone]   Fax - [phone]  [facility] [address] [place], [place]  [address] Phone - [phone]   Fax - [phone]  [facility] [address] [place], [place]  [address] Phone - [phone]   Fax - [phone]   [facility] [address] [place], [place]  [address] Phone - [phone]   Fax - [phone]  [facility] [address] [place], [place]  [address] Phone - [phone]   Fax - [phone]  [facility] [address] [place], [place]  [address] Phone - [phone]   Fax - [phone]  [facility] [address] , [place]  [address] Phone - [phone]   Fax - [phone]  [facility] [address] , [place]  [address] Phone - [phone]   Fax - [phone] [facility] [address] [place], [place]  [address] Phone - [phone]   Fax - [phone]  [facility] [address] [place], [place]  [address] Phone - [phone]   Fax - [phone]  [facility] [address] [place], [place]  [address] Phone - [phone]   Fax - [phone]  [facility] Charlene Flemming MD [address] [place]  [address] Phone [phone]  Fax [phone]   

## 2016-12-15 ENCOUNTER — Other Ambulatory Visit (HOSPITAL_COMMUNITY)
Admission: RE | Admit: 2016-12-15 | Discharge: 2016-12-15 | Disposition: A | Discharge status: Home / Self Care | Payer: Medicaid Other | Source: Ambulatory Visit | Attending: Certified Nurse Midwife | Admitting: Certified Nurse Midwife

## 2016-12-15 ENCOUNTER — Encounter: Payer: Self-pay | Admitting: Certified Nurse Midwife

## 2016-12-15 ENCOUNTER — Ambulatory Visit (INDEPENDENT_AMBULATORY_CARE_PROVIDER_SITE_OTHER): Payer: Medicaid Other | Admitting: Certified Nurse Midwife

## 2016-12-15 VITALS — BP 107/61 | HR 92 | Wt 142.0 lb

## 2016-12-15 DIAGNOSIS — Z3A35 35 weeks gestation of pregnancy: Secondary | ICD-10-CM | POA: Insufficient documentation

## 2016-12-15 DIAGNOSIS — Z3403 Encounter for supervision of normal first pregnancy, third trimester: Secondary | ICD-10-CM | POA: Diagnosis not present

## 2016-12-15 DIAGNOSIS — Z3400 Encounter for supervision of normal first pregnancy, unspecified trimester: Secondary | ICD-10-CM

## 2016-12-15 LAB — OB RESULTS CONSOLE GC/CHLAMYDIA: Gonorrhea: NEGATIVE

## 2016-12-15 NOTE — Patient Instructions (Signed)
AREA PEDIATRIC/FAMILY PRACTICE PHYSICIANS  [facility] [address] [place], [place]  [address] Phone - [phone]   Fax - [phone]  [facility] [address] [place], [place] [address] Phone - [phone]   Fax - [phone]  [facility] [address] [place], [place]  [address] Phone - [phone]   Fax - [phone]  [facility] [address] [place], [place]  [address] Phone - [phone]   Fax - [phone]  [facility] [address] [place], [place]  [address] Phone - [phone]   Fax - [phone]  [facility] [address] [place], [place]  [address] Phone - [phone]   Fax - [phone]  [facility] [address] [place], [place]  [address] Phone - [phone]   Fax - [phone]  [facility] [address] [place], [place]  [address] Phone - [phone]   Fax - [phone]  [facility] [address] [place], [place]  [address] Phone - [phone]   Fax - [phone] [facility] [address] [place], [place]  [address] Phone - [phone]   Fax - [phone]  [facility] [address] [place], [place]  [address] Phone - [phone]   Fax - [phone]  [facility] [address] [place], [place]  [address] Phone - [phone]   Fax - [phone]  [facility] [address] [place], [place]  [address] Phone - [phone]   Fax - [phone]  [facility] GOSRANI [address] [place], [place]  [address] Phone - [phone]  [facility] [address] [place], [place]  [address] Phone - [phone]   Fax - [phone]  [facility] [address] [place], [place]  [address] Phone - [phone]   Fax - [phone]  [facility] [address] [place], [place]  [address] Phone - [phone]   Fax - [phone]  [facility] [address] [place], [place]  [address] Phone - [phone]   Fax - [phone]   [facility] [address] [place], [place]  [address] Phone - [phone]   Fax - [phone]  [facility] [address] [place], [place]  [address] Phone - [phone]   Fax - [phone]  [facility] [address] [place], [place]  [address] Phone - [phone]   Fax - [phone]  [facility] [address] , [place]  [address] Phone - [phone]   Fax - [phone]  [facility] [address] , [place]  [address] Phone - [phone]   Fax - [phone] [facility] [address] [place], [place]  [address] Phone - [phone]   Fax - [phone]  [facility] [address] [place], [place]  [address] Phone - [phone]   Fax - [phone]  [facility] [address] [place],   [address] Phone - [phone]   Fax - [phone]  [facility] Charlene Flemming MD [address] [place]  [address] Phone [phone]  Fax [phone]   

## 2016-12-15 NOTE — Progress Notes (Signed)
   PRENATAL VISIT NOTE  Subjective:  Haley Potts is a 21 y.o. G1P0000 at 7948w2d being seen today for ongoing prenatal care.  She is currently monitored for the following issues for this low-risk pregnancy and has Supervision of normal pregnancy, antepartum on her problem list.  Patient reports no complaints.  Contractions: Irritability. Vag. Bleeding: None.  Movement: Present. Denies leaking of fluid.   The following portions of the patient's history were reviewed and updated as appropriate: allergies, current medications, past family history, past medical history, past social history, past surgical history and problem list. Problem list updated.  Objective:   Vitals:   12/15/16 1345  BP: 107/61  Pulse: 92  Weight: 142 lb (64.4 kg)    Fetal Status: Fetal Heart Rate (bpm): 142 Fundal Height: 33 cm Movement: Present  Presentation: Vertex  General:  Alert, oriented and cooperative. Patient is in no acute distress.  Skin: Skin is warm and dry. No rash noted.   Cardiovascular: Normal heart rate noted  Respiratory: Normal respiratory effort, no problems with respiration noted  Abdomen: Soft, gravid, appropriate for gestational age. Pain/Pressure: Absent     Pelvic:  Cervical exam performed Dilation: Closed Effacement (%): Thick Station: Ballotable  Extremities: Normal range of motion.  Edema: None  Mental Status: Normal mood and affect. Normal behavior. Normal judgment and thought content.   Assessment and Plan:  Pregnancy: G1P0000 at 9148w2d  1. Supervision of normal first pregnancy, antepartum     Doing well - Strep Gp B NAA - Cervicovaginal ancillary only  Preterm labor symptoms and general obstetric precautions including but not limited to vaginal bleeding, contractions, leaking of fluid and fetal movement were reviewed in detail with the patient. Please refer to After Visit Summary for other counseling recommendations.  Return in about 1 week (around 12/22/2016) for  ROB.   Roe Coombsachelle A Denney, CNM

## 2016-12-16 LAB — CERVICOVAGINAL ANCILLARY ONLY
BACTERIAL VAGINITIS: NEGATIVE
Candida vaginitis: NEGATIVE
Chlamydia: NEGATIVE
NEISSERIA GONORRHEA: NEGATIVE
TRICH (WINDOWPATH): NEGATIVE

## 2016-12-17 LAB — STREP GP B NAA: STREP GROUP B AG: POSITIVE — AB

## 2016-12-19 ENCOUNTER — Other Ambulatory Visit: Payer: Self-pay | Admitting: Certified Nurse Midwife

## 2016-12-19 DIAGNOSIS — Z3400 Encounter for supervision of normal first pregnancy, unspecified trimester: Secondary | ICD-10-CM

## 2016-12-22 ENCOUNTER — Encounter: Payer: Self-pay | Admitting: Obstetrics

## 2016-12-22 ENCOUNTER — Ambulatory Visit (INDEPENDENT_AMBULATORY_CARE_PROVIDER_SITE_OTHER): Payer: Medicaid Other | Admitting: Obstetrics

## 2016-12-22 VITALS — BP 103/61 | HR 99 | Wt 141.8 lb

## 2016-12-22 DIAGNOSIS — Z3400 Encounter for supervision of normal first pregnancy, unspecified trimester: Secondary | ICD-10-CM

## 2016-12-22 DIAGNOSIS — Z3403 Encounter for supervision of normal first pregnancy, third trimester: Secondary | ICD-10-CM

## 2016-12-22 NOTE — Progress Notes (Signed)
Subjective:  Haley Potts is a 21 y.o. G1P0000 at 337w2d being seen today for ongoing prenatal care.  She is currently monitored for the following issues for this low-risk pregnancy and has Supervision of normal pregnancy, antepartum on her problem list.  Patient reports no complaints.  Contractions: Not present. Vag. Bleeding: None.  Movement: Present. Denies leaking of fluid.   The following portions of the patient's history were reviewed and updated as appropriate: allergies, current medications, past family history, past medical history, past social history, past surgical history and problem list. Problem list updated.  Objective:   Vitals:   12/22/16 1529  BP: 103/61  Pulse: 99  Weight: 141 lb 12.8 oz (64.3 kg)    Fetal Status: Fetal Heart Rate (bpm): 140   Movement: Present     General:  Alert, oriented and cooperative. Patient is in no acute distress.  Skin: Skin is warm and dry. No rash noted.   Cardiovascular: Normal heart rate noted  Respiratory: Normal respiratory effort, no problems with respiration noted  Abdomen: Soft, gravid, appropriate for gestational age. Pain/Pressure: Absent     Pelvic:  Cervical exam deferred        Extremities: Normal range of motion.  Edema: None  Mental Status: Normal mood and affect. Normal behavior. Normal judgment and thought content.   Urinalysis:      Assessment and Plan:  Pregnancy: G1P0000 at 8037w2d  1. Supervision of normal first pregnancy, antepartum Doing well.  Preterm labor symptoms and general obstetric precautions including but not limited to vaginal bleeding, contractions, leaking of fluid and fetal movement were reviewed in detail with the patient. Please refer to After Visit Summary for other counseling recommendations.  Return in about 1 week (around 12/29/2016) for ROB.   Brock BadHarper, Queena Monrreal A, MD

## 2016-12-26 ENCOUNTER — Ambulatory Visit (INDEPENDENT_AMBULATORY_CARE_PROVIDER_SITE_OTHER): Payer: Self-pay | Admitting: Pediatrics

## 2016-12-26 DIAGNOSIS — Z7681 Expectant parent(s) prebirth pediatrician visit: Secondary | ICD-10-CM

## 2016-12-26 NOTE — Progress Notes (Signed)
Prenatal counseling for impending newborn done. Mom denies any tobacco, alcohol, or drug use. Mom denies any health complications or chronic health conditions.

## 2016-12-28 ENCOUNTER — Ambulatory Visit (INDEPENDENT_AMBULATORY_CARE_PROVIDER_SITE_OTHER): Payer: Medicaid Other | Admitting: Obstetrics

## 2016-12-28 ENCOUNTER — Encounter: Payer: Self-pay | Admitting: Obstetrics

## 2016-12-28 VITALS — BP 109/70 | HR 103 | Wt 144.0 lb

## 2016-12-28 DIAGNOSIS — Z3400 Encounter for supervision of normal first pregnancy, unspecified trimester: Secondary | ICD-10-CM

## 2016-12-28 DIAGNOSIS — Z3403 Encounter for supervision of normal first pregnancy, third trimester: Secondary | ICD-10-CM

## 2016-12-28 NOTE — Progress Notes (Signed)
Subjective:  Haley Potts is a 21 y.o. G1P0000 at 505w1d being seen today for ongoing prenatal care.  She is currently monitored for the following issues for this low-risk pregnancy and has Pediatric pre-birth visit for expectant mother on her problem list.  Patient reports no complaints.  Contractions: Irregular. Vag. Bleeding: None.  Movement: Present. Denies leaking of fluid.   The following portions of the patient's history were reviewed and updated as appropriate: allergies, current medications, past family history, past medical history, past social history, past surgical history and problem list. Problem list updated.  Objective:   Vitals:   12/28/16 1528  BP: 109/70  Pulse: (!) 103  Weight: 144 lb (65.3 kg)    Fetal Status: Fetal Heart Rate (bpm): 140   Movement: Present     General:  Alert, oriented and cooperative. Patient is in no acute distress.  Skin: Skin is warm and dry. No rash noted.   Cardiovascular: Normal heart rate noted  Respiratory: Normal respiratory effort, no problems with respiration noted  Abdomen: Soft, gravid, appropriate for gestational age. Pain/Pressure: Present     Pelvic:  Cervical exam deferred        Extremities: Normal range of motion.     Mental Status: Normal mood and affect. Normal behavior. Normal judgment and thought content.   Urinalysis:      Assessment and Plan:  Pregnancy: G1P0000 at 705w1d  1. Supervision of normal first pregnancy, antepartum - Doing well.  Term labor symptoms and general obstetric precautions including but not limited to vaginal bleeding, contractions, leaking of fluid and fetal movement were reviewed in detail with the patient. Please refer to After Visit Summary for other counseling recommendations.  Return in about 1 week (around 01/04/2017) for ROB.   Brock BadHarper, Delvonte Berenson A, MDPatient ID: Haley HaroldNatasaja N Brugger, female   DOB: 06/10/1996, 21 y.o.   MRN: [id number]

## 2017-01-05 ENCOUNTER — Ambulatory Visit (INDEPENDENT_AMBULATORY_CARE_PROVIDER_SITE_OTHER): Payer: Medicaid Other | Admitting: Advanced Practice Midwife

## 2017-01-05 VITALS — BP 112/72 | HR 86 | Wt 143.4 lb

## 2017-01-05 DIAGNOSIS — Z3400 Encounter for supervision of normal first pregnancy, unspecified trimester: Secondary | ICD-10-CM

## 2017-01-05 DIAGNOSIS — Z3403 Encounter for supervision of normal first pregnancy, third trimester: Secondary | ICD-10-CM

## 2017-01-05 DIAGNOSIS — O99820 Streptococcus B carrier state complicating pregnancy: Secondary | ICD-10-CM | POA: Insufficient documentation

## 2017-01-05 NOTE — Progress Notes (Signed)
   PRENATAL VISIT NOTE  Subjective:  Haley Potts is a 21 y.o. G1P0000 at 731w2d being seen today for ongoing prenatal care.  She is currently monitored for the following issues for this low-risk pregnancy and has Pediatric pre-birth visit for expectant mother on her problem list.  Patient reports no complaints.  Contractions: Not present. Vag. Bleeding: None.  Movement: Present. Denies leaking of fluid.   The following portions of the patient's history were reviewed and updated as appropriate: allergies, current medications, past family history, past medical history, past social history, past surgical history and problem list. Problem list updated.  Objective:   Vitals:   01/05/17 1104  BP: 112/72  Pulse: 86  Weight: 143 lb 6.4 oz (65 kg)    Fetal Status:     Movement: Present     General:  Alert, oriented and cooperative. Patient is in no acute distress.  Skin: Skin is warm and dry. No rash noted.   Cardiovascular: Normal heart rate noted  Respiratory: Normal respiratory effort, no problems with respiration noted  Abdomen: Soft, gravid, appropriate for gestational age. Pain/Pressure: Absent     Pelvic:  Cervical exam deferred        Extremities: Normal range of motion.  Edema: None  Mental Status: Normal mood and affect. Normal behavior. Normal judgment and thought content.   Assessment and Plan:  Pregnancy: G1P0000 at 801w2d  1. Supervision of normal first pregnancy, antepartum      Discussed signs of labor and MAU use      Long discussion of breastfeeding. May consider "doing it in the hospital... It's just weird"  Term labor symptoms and general obstetric precautions including but not limited to vaginal bleeding, contractions, leaking of fluid and fetal movement were reviewed in detail with the patient. Please refer to After Visit Summary for other counseling recommendations.  Return in about 1 week (around 01/12/2017) for [place].   Wynelle BourgeoisMarie Zakhai Meisinger, CNM

## 2017-01-05 NOTE — Patient Instructions (Signed)
Vaginal Delivery Vaginal delivery means that you will give birth by pushing your baby out of your birth canal (vagina). A team of health care providers will help you before, during, and after vaginal delivery. Birth experiences are unique for every woman and every pregnancy, and birth experiences vary depending on where you choose to give birth. What should I do to prepare for my baby's birth? Before your baby is born, it is important to talk with your health care provider about:  Your labor and delivery preferences. These may include: ? Medicines that you may be given. ? How you will manage your pain. This might include non-medical pain relief techniques or injectable pain relief such as epidural analgesia. ? How you and your baby will be monitored during labor and delivery. ? Who may be in the labor and delivery room with you. ? Your feelings about surgical delivery of your baby (cesarean delivery, or C-section) if this becomes necessary. ? Your feelings about receiving donated blood through an IV tube (blood transfusion) if this becomes necessary.  Whether you are able: ? To take pictures or videos of the birth. ? To eat during labor and delivery. ? To move around, walk, or change positions during labor and delivery.  What to expect after your baby is born, such as: ? Whether delayed umbilical cord clamping and cutting is offered. ? Who will care for your baby right after birth. ? Medicines or tests that may be recommended for your baby. ? Whether breastfeeding is supported in your hospital or birth center. ? How long you will be in the hospital or birth center.  How any medical conditions you have may affect your baby or your labor and delivery experience.  To prepare for your baby's birth, you should also:  Attend all of your health care visits before delivery (prenatal visits) as recommended by your health care provider. This is important.  Prepare your home for your baby's  arrival. Make sure that you have: ? Diapers. ? Baby clothing. ? Feeding equipment. ? Safe sleeping arrangements for you and your baby.  Install a car seat in your vehicle. Have your car seat checked by a certified car seat installer to make sure that it is installed safely.  Think about who will help you with your new baby at home for at least the first several weeks after delivery.  What can I expect when I arrive at the birth center or hospital? Once you are in labor and have been admitted into the hospital or birth center, your health care provider may:  Review your pregnancy history and any concerns you have.  Insert an IV tube into one of your veins. This is used to give you fluids and medicines.  Check your blood pressure, pulse, temperature, and heart rate (vital signs).  Check whether your bag of water (amniotic sac) has broken (ruptured).  Talk with you about your birth plan and discuss pain control options.  Monitoring Your health care provider may monitor your contractions (uterine monitoring) and your baby's heart rate (fetal monitoring). You may need to be monitored:  Often, but not continuously (intermittently).  All the time or for long periods at a time (continuously). Continuous monitoring may be needed if: ? You are taking certain medicines, such as medicine to relieve pain or make your contractions stronger. ? You have pregnancy or labor complications.  Monitoring may be done by:  Placing a special stethoscope or a handheld monitoring device on your abdomen to   check your baby's heartbeat, and feeling your abdomen for contractions. This method of monitoring does not continuously record your baby's heartbeat or your contractions.  Placing monitors on your abdomen (external monitors) to record your baby's heartbeat and the frequency and length of contractions. You may not have to wear external monitors all the time.  Placing monitors inside of your uterus  (internal monitors) to record your baby's heartbeat and the frequency, length, and strength of your contractions. ? Your health care provider may use internal monitors if he or she needs more information about the strength of your contractions or your baby's heart rate. ? Internal monitors are put in place by passing a thin, flexible wire through your vagina and into your uterus. Depending on the type of monitor, it may remain in your uterus or on your baby's head until birth. ? Your health care provider will discuss the benefits and risks of internal monitoring with you and will ask for your permission before inserting the monitors.  Telemetry. This is a type of continuous monitoring that can be done with external or internal monitors. Instead of having to stay in bed, you are able to move around during telemetry. Ask your health care provider if telemetry is an option for you.  Physical exam Your health care provider may perform a physical exam. This may include:  Checking whether your baby is positioned: ? With the head toward your vagina (head-down). This is most common. ? With the head toward the top of your uterus (head-up or breech). If your baby is in a breech position, your health care provider may try to turn your baby to a head-down position so you can deliver vaginally. If it does not seem that your baby can be born vaginally, your provider may recommend surgery to deliver your baby. In rare cases, you may be able to deliver vaginally if your baby is head-up (breech delivery). ? Lying sideways (transverse). Babies that are lying sideways cannot be delivered vaginally.  Checking your cervix to determine: ? Whether it is thinning out (effacing). ? Whether it is opening up (dilating). ? How low your baby has moved into your birth canal.  What are the three stages of labor and delivery?  Normal labor and delivery is divided into the following three stages: Stage 1  Stage 1 is the  longest stage of labor, and it can last for hours or days. Stage 1 includes: ? Early labor. This is when contractions may be irregular, or regular and mild. Generally, early labor contractions are more than 10 minutes apart. ? Active labor. This is when contractions get longer, more regular, more frequent, and more intense. ? The transition phase. This is when contractions happen very close together, are very intense, and may last longer than during any other part of labor.  Contractions generally feel mild, infrequent, and irregular at first. They get stronger, more frequent (about every 2-3 minutes), and more regular as you progress from early labor through active labor and transition.  Many women progress through stage 1 naturally, but you may need help to continue making progress. If this happens, your health care provider may talk with you about: ? Rupturing your amniotic sac if it has not ruptured yet. ? Giving you medicine to help make your contractions stronger and more frequent.  Stage 1 ends when your cervix is completely dilated to 4 inches (10 cm) and completely effaced. This happens at the end of the transition phase. Stage 2  Once   your cervix is completely effaced and dilated to 4 inches (10 cm), you may start to feel an urge to push. It is common for the body to naturally take a rest before feeling the urge to push, especially if you received an epidural or certain other pain medicines. This rest period may last for up to 1-2 hours, depending on your unique labor experience.  During stage 2, contractions are generally less painful, because pushing helps relieve contraction pain. Instead of contraction pain, you may feel stretching and burning pain, especially when the widest part of your baby's head passes through the vaginal opening (crowning).  Your health care provider will closely monitor your pushing progress and your baby's progress through the vagina during stage 2.  Your  health care provider may massage the area of skin between your vaginal opening and anus (perineum) or apply warm compresses to your perineum. This helps it stretch as the baby's head starts to crown, which can help prevent perineal tearing. ? In some cases, an incision may be made in your perineum (episiotomy) to allow the baby to pass through the vaginal opening. An episiotomy helps to make the opening of the vagina larger to allow more room for the baby to fit through.  It is very important to breathe and focus so your health care provider can control the delivery of your baby's head. Your health care provider may have you decrease the intensity of your pushing, to help prevent perineal tearing.  After delivery of your baby's head, the shoulders and the rest of the body generally deliver very quickly and without difficulty.  Once your baby is delivered, the umbilical cord may be cut right away, or this may be delayed for 1-2 minutes, depending on your baby's health. This may vary among health care providers, hospitals, and birth centers.  If you and your baby are healthy enough, your baby may be placed on your chest or abdomen to help maintain the baby's temperature and to help you bond with each other. Some mothers and babies start breastfeeding at this time. Your health care team will dry your baby and help keep your baby warm during this time.  Your baby may need immediate care if he or she: ? Showed signs of distress during labor. ? Has a medical condition. ? Was born too early (prematurely). ? Had a bowel movement before birth (meconium). ? Shows signs of difficulty transitioning from being inside the uterus to being outside of the uterus. If you are planning to breastfeed, your health care team will help you begin a feeding. Stage 3  The third stage of labor starts immediately after the birth of your baby and ends after you deliver the placenta. The placenta is an organ that develops  during pregnancy to provide oxygen and nutrients to your baby in the womb.  Delivering the placenta may require some pushing, and you may have mild contractions. Breastfeeding can stimulate contractions to help you deliver the placenta.  After the placenta is delivered, your uterus should tighten (contract) and become firm. This helps to stop bleeding in your uterus. To help your uterus contract and to control bleeding, your health care provider may: ? Give you medicine by injection, through an IV tube, by mouth, or through your rectum (rectally). ? Massage your abdomen or perform a vaginal exam to remove any blood clots that are left in your uterus. ? Empty your bladder by placing a thin, flexible tube (catheter) into your bladder. ? Encourage   you to breastfeed your baby. After labor is over, you and your baby will be monitored closely to ensure that you are both healthy until you are ready to go home. Your health care team will teach you how to care for yourself and your baby. This information is not intended to replace advice given to you by your health care provider. Make sure you discuss any questions you have with your health care provider. Document Released: 03/29/2008 Document Revised: 01/08/2016 Document Reviewed: 07/05/2015 Elsevier Interactive Patient Education  2018 [employer]  

## 2017-01-05 NOTE — Progress Notes (Signed)
Patient reports no concerns today 

## 2017-01-13 ENCOUNTER — Inpatient Hospital Stay (HOSPITAL_COMMUNITY)
Admission: AD | Admit: 2017-01-13 | Discharge: 2017-01-13 | Disposition: A | Discharge status: Home / Self Care | Payer: Medicaid Other | Source: Ambulatory Visit | Attending: Obstetrics & Gynecology | Admitting: Obstetrics & Gynecology

## 2017-01-13 ENCOUNTER — Encounter (HOSPITAL_COMMUNITY): Payer: Self-pay | Admitting: *Deleted

## 2017-01-13 DIAGNOSIS — O471 False labor at or after 37 completed weeks of gestation: Secondary | ICD-10-CM | POA: Diagnosis present

## 2017-01-13 DIAGNOSIS — Z3A39 39 weeks gestation of pregnancy: Secondary | ICD-10-CM | POA: Insufficient documentation

## 2017-01-13 DIAGNOSIS — O479 False labor, unspecified: Secondary | ICD-10-CM

## 2017-01-13 DIAGNOSIS — O99820 Streptococcus B carrier state complicating pregnancy: Secondary | ICD-10-CM

## 2017-01-13 NOTE — MAU Note (Signed)
I have communicated with F. Cresenzo dishmon CNM and reviewed vital signs:  Vitals:   01/13/17 0122  BP: 118/63  Pulse: 79  Resp: 16  Temp: 98.8 F (37.1 C)    Vaginal exam:  Dilation: 1 Effacement (%): 50 Cervical Position: Posterior Station: -3 Presentation: Vertex Exam by:: K. WeissRN,   Also reviewed contraction pattern and that non-stress test is reactive.  It has been documented that patient is contracting every 7-8 minutes with no cervical change not indicating active labor.  Patient denies any other complaints.  Based on this report provider has given order for discharge.  A discharge order and diagnosis entered by a provider.   Labor discharge instructions reviewed with patient.

## 2017-01-18 ENCOUNTER — Other Ambulatory Visit: Payer: Self-pay | Admitting: Advanced Practice Midwife

## 2017-01-18 ENCOUNTER — Ambulatory Visit (INDEPENDENT_AMBULATORY_CARE_PROVIDER_SITE_OTHER): Payer: Medicaid Other | Admitting: Obstetrics & Gynecology

## 2017-01-18 VITALS — BP 103/68 | HR 106 | Wt 145.5 lb

## 2017-01-18 DIAGNOSIS — Z3400 Encounter for supervision of normal first pregnancy, unspecified trimester: Secondary | ICD-10-CM

## 2017-01-18 DIAGNOSIS — Z3403 Encounter for supervision of normal first pregnancy, third trimester: Secondary | ICD-10-CM

## 2017-01-18 NOTE — Patient Instructions (Signed)
Vaginal Delivery Vaginal delivery means that you will give birth by pushing your baby out of your birth canal (vagina). A team of health care providers will help you before, during, and after vaginal delivery. Birth experiences are unique for every woman and every pregnancy, and birth experiences vary depending on where you choose to give birth. What should I do to prepare for my baby's birth? Before your baby is born, it is important to talk with your health care provider about:  Your labor and delivery preferences. These may include: ? Medicines that you may be given. ? How you will manage your pain. This might include non-medical pain relief techniques or injectable pain relief such as epidural analgesia. ? How you and your baby will be monitored during labor and delivery. ? Who may be in the labor and delivery room with you. ? Your feelings about surgical delivery of your baby (cesarean delivery, or C-section) if this becomes necessary. ? Your feelings about receiving donated blood through an IV tube (blood transfusion) if this becomes necessary.  Whether you are able: ? To take pictures or videos of the birth. ? To eat during labor and delivery. ? To move around, walk, or change positions during labor and delivery.  What to expect after your baby is born, such as: ? Whether delayed umbilical cord clamping and cutting is offered. ? Who will care for your baby right after birth. ? Medicines or tests that may be recommended for your baby. ? Whether breastfeeding is supported in your hospital or birth center. ? How long you will be in the hospital or birth center.  How any medical conditions you have may affect your baby or your labor and delivery experience.  To prepare for your baby's birth, you should also:  Attend all of your health care visits before delivery (prenatal visits) as recommended by your health care provider. This is important.  Prepare your home for your baby's  arrival. Make sure that you have: ? Diapers. ? Baby clothing. ? Feeding equipment. ? Safe sleeping arrangements for you and your baby.  Install a car seat in your vehicle. Have your car seat checked by a certified car seat installer to make sure that it is installed safely.  Think about who will help you with your new baby at home for at least the first several weeks after delivery.  What can I expect when I arrive at the birth center or hospital? Once you are in labor and have been admitted into the hospital or birth center, your health care provider may:  Review your pregnancy history and any concerns you have.  Insert an IV tube into one of your veins. This is used to give you fluids and medicines.  Check your blood pressure, pulse, temperature, and heart rate (vital signs).  Check whether your bag of water (amniotic sac) has broken (ruptured).  Talk with you about your birth plan and discuss pain control options.  Monitoring Your health care provider may monitor your contractions (uterine monitoring) and your baby's heart rate (fetal monitoring). You may need to be monitored:  Often, but not continuously (intermittently).  All the time or for long periods at a time (continuously). Continuous monitoring may be needed if: ? You are taking certain medicines, such as medicine to relieve pain or make your contractions stronger. ? You have pregnancy or labor complications.  Monitoring may be done by:  Placing a special stethoscope or a handheld monitoring device on your abdomen to   check your baby's heartbeat, and feeling your abdomen for contractions. This method of monitoring does not continuously record your baby's heartbeat or your contractions.  Placing monitors on your abdomen (external monitors) to record your baby's heartbeat and the frequency and length of contractions. You may not have to wear external monitors all the time.  Placing monitors inside of your uterus  (internal monitors) to record your baby's heartbeat and the frequency, length, and strength of your contractions. ? Your health care provider may use internal monitors if he or she needs more information about the strength of your contractions or your baby's heart rate. ? Internal monitors are put in place by passing a thin, flexible wire through your vagina and into your uterus. Depending on the type of monitor, it may remain in your uterus or on your baby's head until birth. ? Your health care provider will discuss the benefits and risks of internal monitoring with you and will ask for your permission before inserting the monitors.  Telemetry. This is a type of continuous monitoring that can be done with external or internal monitors. Instead of having to stay in bed, you are able to move around during telemetry. Ask your health care provider if telemetry is an option for you.  Physical exam Your health care provider may perform a physical exam. This may include:  Checking whether your baby is positioned: ? With the head toward your vagina (head-down). This is most common. ? With the head toward the top of your uterus (head-up or breech). If your baby is in a breech position, your health care provider may try to turn your baby to a head-down position so you can deliver vaginally. If it does not seem that your baby can be born vaginally, your provider may recommend surgery to deliver your baby. In rare cases, you may be able to deliver vaginally if your baby is head-up (breech delivery). ? Lying sideways (transverse). Babies that are lying sideways cannot be delivered vaginally.  Checking your cervix to determine: ? Whether it is thinning out (effacing). ? Whether it is opening up (dilating). ? How low your baby has moved into your birth canal.  What are the three stages of labor and delivery?  Normal labor and delivery is divided into the following three stages: Stage 1  Stage 1 is the  longest stage of labor, and it can last for hours or days. Stage 1 includes: ? Early labor. This is when contractions may be irregular, or regular and mild. Generally, early labor contractions are more than 10 minutes apart. ? Active labor. This is when contractions get longer, more regular, more frequent, and more intense. ? The transition phase. This is when contractions happen very close together, are very intense, and may last longer than during any other part of labor.  Contractions generally feel mild, infrequent, and irregular at first. They get stronger, more frequent (about every 2-3 minutes), and more regular as you progress from early labor through active labor and transition.  Many women progress through stage 1 naturally, but you may need help to continue making progress. If this happens, your health care provider may talk with you about: ? Rupturing your amniotic sac if it has not ruptured yet. ? Giving you medicine to help make your contractions stronger and more frequent.  Stage 1 ends when your cervix is completely dilated to 4 inches (10 cm) and completely effaced. This happens at the end of the transition phase. Stage 2  Once   your cervix is completely effaced and dilated to 4 inches (10 cm), you may start to feel an urge to push. It is common for the body to naturally take a rest before feeling the urge to push, especially if you received an epidural or certain other pain medicines. This rest period may last for up to 1-2 hours, depending on your unique labor experience.  During stage 2, contractions are generally less painful, because pushing helps relieve contraction pain. Instead of contraction pain, you may feel stretching and burning pain, especially when the widest part of your baby's head passes through the vaginal opening (crowning).  Your health care provider will closely monitor your pushing progress and your baby's progress through the vagina during stage 2.  Your  health care provider may massage the area of skin between your vaginal opening and anus (perineum) or apply warm compresses to your perineum. This helps it stretch as the baby's head starts to crown, which can help prevent perineal tearing. ? In some cases, an incision may be made in your perineum (episiotomy) to allow the baby to pass through the vaginal opening. An episiotomy helps to make the opening of the vagina larger to allow more room for the baby to fit through.  It is very important to breathe and focus so your health care provider can control the delivery of your baby's head. Your health care provider may have you decrease the intensity of your pushing, to help prevent perineal tearing.  After delivery of your baby's head, the shoulders and the rest of the body generally deliver very quickly and without difficulty.  Once your baby is delivered, the umbilical cord may be cut right away, or this may be delayed for 1-2 minutes, depending on your baby's health. This may vary among health care providers, hospitals, and birth centers.  If you and your baby are healthy enough, your baby may be placed on your chest or abdomen to help maintain the baby's temperature and to help you bond with each other. Some mothers and babies start breastfeeding at this time. Your health care team will dry your baby and help keep your baby warm during this time.  Your baby may need immediate care if he or she: ? Showed signs of distress during labor. ? Has a medical condition. ? Was born too early (prematurely). ? Had a bowel movement before birth (meconium). ? Shows signs of difficulty transitioning from being inside the uterus to being outside of the uterus. If you are planning to breastfeed, your health care team will help you begin a feeding. Stage 3  The third stage of labor starts immediately after the birth of your baby and ends after you deliver the placenta. The placenta is an organ that develops  during pregnancy to provide oxygen and nutrients to your baby in the womb.  Delivering the placenta may require some pushing, and you may have mild contractions. Breastfeeding can stimulate contractions to help you deliver the placenta.  After the placenta is delivered, your uterus should tighten (contract) and become firm. This helps to stop bleeding in your uterus. To help your uterus contract and to control bleeding, your health care provider may: ? Give you medicine by injection, through an IV tube, by mouth, or through your rectum (rectally). ? Massage your abdomen or perform a vaginal exam to remove any blood clots that are left in your uterus. ? Empty your bladder by placing a thin, flexible tube (catheter) into your bladder. ? Encourage   you to breastfeed your baby. After labor is over, you and your baby will be monitored closely to ensure that you are both healthy until you are ready to go home. Your health care team will teach you how to care for yourself and your baby. This information is not intended to replace advice given to you by your health care provider. Make sure you discuss any questions you have with your health care provider. Document Released: 03/29/2008 Document Revised: 01/08/2016 Document Reviewed: 07/05/2015 Elsevier Interactive Patient Education  2018 [employer]  

## 2017-01-18 NOTE — Progress Notes (Signed)
   PRENATAL VISIT NOTE  Subjective:  Haley Potts is a 21 y.o. G1P0000 at 3820w1d being seen today for ongoing prenatal care.  She is currently monitored for the following issues for this high-risk pregnancy and has Pediatric pre-birth visit for expectant mother and Group B streptococcal carriage complicating pregnancy on her problem list.  Patient reports no complaints.  Contractions: Irregular. Vag. Bleeding: None.  Movement: Present. Denies leaking of fluid.   The following portions of the patient's history were reviewed and updated as appropriate: allergies, current medications, past family history, past medical history, past social history, past surgical history and problem list. Problem list updated.  Objective:   Vitals:   01/18/17 1509  BP: 103/68  Pulse: (!) 106  Weight: 145 lb 8 oz (66 kg)    Fetal Status: Fetal Heart Rate (bpm): 140   Movement: Present     General:  Alert, oriented and cooperative. Patient is in no acute distress.  Skin: Skin is warm and dry. No rash noted.   Cardiovascular: Normal heart rate noted  Respiratory: Normal respiratory effort, no problems with respiration noted  Abdomen: Soft, gravid, appropriate for gestational age.  Pain/Pressure: Present     Pelvic: Cervical exam deferred        Extremities: Normal range of motion.     Mental Status:  Normal mood and affect. Normal behavior. Normal judgment and thought content.   Assessment and Plan:  Pregnancy: G1P0000 at 7420w1d  There are no diagnoses linked to this encounter. Term labor symptoms and general obstetric precautions including but not limited to vaginal bleeding, contractions, leaking of fluid and fetal movement were reviewed in detail with the patient. Please refer to After Visit Summary for other counseling recommendations.  Return in about 2 days (around 01/20/2017) for NST . IOL 41 weeks prn  Scheryl DarterJames Arnold, MD

## 2017-01-18 NOTE — Progress Notes (Signed)
Pt was seen in MAU over the weekend and states had cervical exam of 1cm. Pt does not want to be checked today unless it's necessary.

## 2017-01-19 ENCOUNTER — Telehealth (HOSPITAL_COMMUNITY): Payer: Self-pay | Admitting: *Deleted

## 2017-01-19 NOTE — Telephone Encounter (Signed)
Preadmission screen  

## 2017-01-20 ENCOUNTER — Other Ambulatory Visit: Payer: Self-pay | Admitting: Certified Nurse Midwife

## 2017-01-20 ENCOUNTER — Ambulatory Visit (INDEPENDENT_AMBULATORY_CARE_PROVIDER_SITE_OTHER): Payer: Medicaid Other

## 2017-01-20 ENCOUNTER — Ambulatory Visit (HOSPITAL_COMMUNITY)
Admission: RE | Admit: 2017-01-20 | Discharge: 2017-01-20 | Disposition: A | Discharge status: Home / Self Care | Payer: Medicaid Other | Source: Ambulatory Visit | Attending: Certified Nurse Midwife | Admitting: Certified Nurse Midwife

## 2017-01-20 DIAGNOSIS — Z3490 Encounter for supervision of normal pregnancy, unspecified, unspecified trimester: Secondary | ICD-10-CM

## 2017-01-20 DIAGNOSIS — O288 Other abnormal findings on antenatal screening of mother: Secondary | ICD-10-CM

## 2017-01-20 DIAGNOSIS — O9989 Other specified diseases and conditions complicating pregnancy, childbirth and the puerperium: Secondary | ICD-10-CM | POA: Diagnosis not present

## 2017-01-20 DIAGNOSIS — O480 Post-term pregnancy: Secondary | ICD-10-CM | POA: Insufficient documentation

## 2017-01-20 DIAGNOSIS — Z3A40 40 weeks gestation of pregnancy: Secondary | ICD-10-CM | POA: Diagnosis not present

## 2017-01-20 DIAGNOSIS — O283 Abnormal ultrasonic finding on antenatal screening of mother: Secondary | ICD-10-CM | POA: Insufficient documentation

## 2017-01-20 NOTE — Progress Notes (Signed)
Nurse visit for NST only. Consulted with CNM. NST non Reactive today.

## 2017-01-23 ENCOUNTER — Inpatient Hospital Stay (HOSPITAL_COMMUNITY)
Admission: AD | Admit: 2017-01-23 | Discharge: 2017-01-25 | DRG: 774 | Disposition: A | Discharge status: Home / Self Care | Payer: Medicaid Other | Source: Ambulatory Visit | Attending: Family Medicine | Admitting: Family Medicine

## 2017-01-23 ENCOUNTER — Encounter (HOSPITAL_COMMUNITY): Payer: Self-pay

## 2017-01-23 ENCOUNTER — Telehealth: Payer: Self-pay

## 2017-01-23 ENCOUNTER — Inpatient Hospital Stay (HOSPITAL_COMMUNITY)
Admission: AD | Admit: 2017-01-23 | Discharge: 2017-01-23 | Disposition: A | Discharge status: Home / Self Care | Payer: Medicaid Other | Source: Ambulatory Visit | Attending: Family Medicine | Admitting: Family Medicine

## 2017-01-23 DIAGNOSIS — O99820 Streptococcus B carrier state complicating pregnancy: Secondary | ICD-10-CM

## 2017-01-23 DIAGNOSIS — O99824 Streptococcus B carrier state complicating childbirth: Secondary | ICD-10-CM | POA: Diagnosis present

## 2017-01-23 DIAGNOSIS — Z3A41 41 weeks gestation of pregnancy: Secondary | ICD-10-CM

## 2017-01-23 DIAGNOSIS — O471 False labor at or after 37 completed weeks of gestation: Secondary | ICD-10-CM

## 2017-01-23 DIAGNOSIS — Z3400 Encounter for supervision of normal first pregnancy, unspecified trimester: Secondary | ICD-10-CM

## 2017-01-23 NOTE — MAU Note (Signed)
I have communicated with Dr. Ephriam Knuckleshristian and reviewed vital signs:  Vitals:   01/23/17 1821  BP: 110/61  Pulse: 96  Resp: 16  Temp: 98.7 F (37.1 C)    Vaginal exam:  Dilation: 1.5 Effacement (%): 70, 80 Cervical Position: Middle Station: -2 Presentation: Vertex Exam by:: A Sunya Humbarger, RN,   Also reviewed contraction pattern and that non-stress test is reactive.  It has been documented that patient is contracting every 4-5 minutes with minimal cervical change over 1 hour not indicating active labor.  Patient denies any other complaints.  Based on this report provider has given order for discharge.  A discharge order and diagnosis entered by a provider.   Labor discharge instructions reviewed with patient.

## 2017-01-23 NOTE — MAU Note (Signed)
Contractions started last night.  q 10 min since round 1 this afternoon.no bleeding or leaking. Mucous d/c last night. Was 1 cm last wkd

## 2017-01-23 NOTE — Telephone Encounter (Signed)
Patient called in with a family friend on the line, pt gave me permission to speak to friend. Patient is scheduled for induction tomorrow and states that mucous plug came out with painful contractions coming every5-10 minutes. [facility]

## 2017-01-23 NOTE — MAU Note (Signed)
Pt c/o contractions and some leaking of mucousy/watery fluid. Pt denies vaginal bleeding. States cervix was 1.5cm. Reports good fetal movement.

## 2017-01-23 NOTE — MAU Note (Signed)
Urine in lab 

## 2017-01-24 ENCOUNTER — Inpatient Hospital Stay (HOSPITAL_COMMUNITY): Admission: RE | Admit: 2017-01-24 | Payer: Medicaid Other | Source: Ambulatory Visit

## 2017-01-24 ENCOUNTER — Inpatient Hospital Stay (HOSPITAL_COMMUNITY): Payer: Medicaid Other | Admitting: Anesthesiology

## 2017-01-24 ENCOUNTER — Encounter (HOSPITAL_COMMUNITY): Payer: Self-pay

## 2017-01-24 DIAGNOSIS — Z3A41 41 weeks gestation of pregnancy: Secondary | ICD-10-CM

## 2017-01-24 DIAGNOSIS — O99824 Streptococcus B carrier state complicating childbirth: Secondary | ICD-10-CM | POA: Diagnosis present

## 2017-01-24 DIAGNOSIS — Z3493 Encounter for supervision of normal pregnancy, unspecified, third trimester: Secondary | ICD-10-CM | POA: Diagnosis present

## 2017-01-24 LAB — DIC (DISSEMINATED INTRAVASCULAR COAGULATION) PANEL
APTT: 30 s (ref 24–36)
FIBRINOGEN: 308 mg/dL (ref 210–475)
INR: 1.06
PLATELETS: 173 10*3/uL (ref 150–400)
PROTHROMBIN TIME: 13.9 s (ref 11.4–15.2)

## 2017-01-24 LAB — CBC
HCT: 31.5 % — ABNORMAL LOW (ref 36.0–46.0)
HCT: 29.5 % — ABNORMAL LOW (ref 36.0–46.0)
HEMATOCRIT: 34.1 % — AB (ref 36.0–46.0)
HEMOGLOBIN: 10.4 g/dL — AB (ref 12.0–15.0)
Hemoglobin: 11.3 g/dL — ABNORMAL LOW (ref 12.0–15.0)
Hemoglobin: 9.7 g/dL — ABNORMAL LOW (ref 12.0–15.0)
MCH: 28.2 pg (ref 26.0–34.0)
MCH: 28.2 pg (ref 26.0–34.0)
MCH: 28.3 pg (ref 26.0–34.0)
MCHC: 32.9 g/dL (ref 30.0–36.0)
MCHC: 33 g/dL (ref 30.0–36.0)
MCHC: 33.1 g/dL (ref 30.0–36.0)
MCV: 85.4 fL (ref 78.0–100.0)
MCV: 85.5 fL (ref 78.0–100.0)
MCV: 85.8 fL (ref 78.0–100.0)
PLATELETS: 168 10*3/uL (ref 150–400)
PLATELETS: 182 10*3/uL (ref 150–400)
PLATELETS: 231 10*3/uL (ref 150–400)
RBC: 3.44 MIL/uL — ABNORMAL LOW (ref 3.87–5.11)
RBC: 3.69 MIL/uL — ABNORMAL LOW (ref 3.87–5.11)
RBC: 3.99 MIL/uL (ref 3.87–5.11)
RDW: 13.7 % (ref 11.5–15.5)
RDW: 13.8 % (ref 11.5–15.5)
RDW: 13.8 % (ref 11.5–15.5)
WBC: 16.8 10*3/uL — AB (ref 4.0–10.5)
WBC: 9 10*3/uL (ref 4.0–10.5)
WBC: 9.5 10*3/uL (ref 4.0–10.5)

## 2017-01-24 LAB — DIC (DISSEMINATED INTRAVASCULAR COAGULATION)PANEL: Smear Review: NONE SEEN

## 2017-01-24 LAB — ABO/RH: ABO/RH(D): A POS

## 2017-01-24 LAB — POSTPARTUM HEMORRHAGE PROTOCOL (BB NOTIFICATION)

## 2017-01-24 MED ORDER — PRENATAL MULTIVITAMIN CH
1.0000 | ORAL_TABLET | Freq: Every day | ORAL | Status: DC
  Administered 2017-01-25: 1 via ORAL
  Filled 2017-01-24: qty 1

## 2017-01-24 MED ORDER — FENTANYL CITRATE (PF) 100 MCG/2ML IJ SOLN: 100.0000 ug | INTRAMUSCULAR | Status: DC | PRN

## 2017-01-24 MED ORDER — SIMETHICONE 80 MG PO CHEW: 80.0000 mg | CHEWABLE_TABLET | ORAL | Status: DC | PRN

## 2017-01-24 MED ORDER — CARBOPROST TROMETHAMINE 250 MCG/ML IM SOLN
INTRAMUSCULAR | Status: AC
  Filled 2017-01-24: qty 1

## 2017-01-24 MED ORDER — OXYCODONE-ACETAMINOPHEN 5-325 MG PO TABS: 2.0000 | ORAL_TABLET | ORAL | Status: DC | PRN

## 2017-01-24 MED ORDER — TETANUS-DIPHTH-ACELL PERTUSSIS 5-2.5-18.5 LF-MCG/0.5 IM SUSP: 0.5000 mL | Freq: Once | INTRAMUSCULAR | Status: DC

## 2017-01-24 MED ORDER — PENICILLIN G POT IN DEXTROSE 60000 UNIT/ML IV SOLN
3.0000 10*6.[IU] | INTRAVENOUS | Status: DC
  Administered 2017-01-24: 3 10*6.[IU] via INTRAVENOUS
  Filled 2017-01-24 (×3): qty 50

## 2017-01-24 MED ORDER — DEXTROSE 5 % IV SOLN
5.0000 10*6.[IU] | Freq: Once | INTRAVENOUS | Status: AC
  Administered 2017-01-24: 5 10*6.[IU] via INTRAVENOUS
  Filled 2017-01-24: qty 5

## 2017-01-24 MED ORDER — METOCLOPRAMIDE HCL 5 MG/ML IJ SOLN
INTRAMUSCULAR | Status: AC
  Administered 2017-01-24: 10 mg
  Filled 2017-01-24: qty 2

## 2017-01-24 MED ORDER — PHENYLEPHRINE 40 MCG/ML (10ML) SYRINGE FOR IV PUSH (FOR BLOOD PRESSURE SUPPORT)
80.0000 ug | PREFILLED_SYRINGE | INTRAVENOUS | Status: DC | PRN
Start: 2017-01-24 — End: 2017-01-24
  Filled 2017-01-24: qty 10
  Filled 2017-01-24: qty 5

## 2017-01-24 MED ORDER — DIPHENHYDRAMINE HCL 50 MG/ML IJ SOLN: 12.5000 mg | INTRAMUSCULAR | Status: DC | PRN

## 2017-01-24 MED ORDER — MISOPROSTOL 200 MCG PO TABS
800.0000 ug | ORAL_TABLET | Freq: Once | ORAL | Status: AC
  Administered 2017-01-24: 800 ug via RECTAL

## 2017-01-24 MED ORDER — ONDANSETRON HCL 4 MG/2ML IJ SOLN: 4.0000 mg | INTRAMUSCULAR | Status: DC | PRN

## 2017-01-24 MED ORDER — MISOPROSTOL 25 MCG QUARTER TABLET
25.0000 ug | ORAL_TABLET | ORAL | Status: DC | PRN
  Filled 2017-01-24: qty 1

## 2017-01-24 MED ORDER — EPHEDRINE 5 MG/ML INJ
10.0000 mg | INTRAVENOUS | Status: DC | PRN
  Filled 2017-01-24: qty 2

## 2017-01-24 MED ORDER — DIBUCAINE 1 % RE OINT: 1.0000 "application " | TOPICAL_OINTMENT | RECTAL | Status: DC | PRN

## 2017-01-24 MED ORDER — METHYLERGONOVINE MALEATE 0.2 MG/ML IJ SOLN
INTRAMUSCULAR | Status: AC
  Administered 2017-01-24: 0.2 mg via INTRAMUSCULAR
  Filled 2017-01-24: qty 1

## 2017-01-24 MED ORDER — LACTATED RINGERS IV BOLUS (SEPSIS): 500.0000 mL | Freq: Once | INTRAVENOUS | Status: DC

## 2017-01-24 MED ORDER — LACTATED RINGERS IV SOLN: 500.0000 mL | Freq: Once | INTRAVENOUS | Status: DC

## 2017-01-24 MED ORDER — ACETAMINOPHEN 325 MG PO TABS: 650.0000 mg | ORAL_TABLET | ORAL | Status: DC | PRN

## 2017-01-24 MED ORDER — FLEET ENEMA 7-19 GM/118ML RE ENEM: 1.0000 | ENEMA | RECTAL | Status: DC | PRN

## 2017-01-24 MED ORDER — COCONUT OIL OIL: 1.0000 "application " | TOPICAL_OIL | Status: DC | PRN

## 2017-01-24 MED ORDER — ONDANSETRON HCL 4 MG/2ML IJ SOLN
4.0000 mg | Freq: Four times a day (QID) | INTRAMUSCULAR | Status: DC | PRN
  Administered 2017-01-24 (×2): 4 mg via INTRAVENOUS
  Filled 2017-01-24 (×2): qty 2

## 2017-01-24 MED ORDER — LOPERAMIDE HCL 2 MG PO CAPS
4.0000 mg | ORAL_CAPSULE | ORAL | Status: DC | PRN
  Administered 2017-01-24: 4 mg via ORAL
  Filled 2017-01-24 (×2): qty 2

## 2017-01-24 MED ORDER — OXYTOCIN 40 UNITS IN LACTATED RINGERS INFUSION - SIMPLE MED
2.5000 [IU]/h | INTRAVENOUS | Status: DC
  Filled 2017-01-24: qty 1000

## 2017-01-24 MED ORDER — MISOPROSTOL 200 MCG PO TABS
ORAL_TABLET | ORAL | Status: AC
  Administered 2017-01-24: 800 ug via RECTAL
  Filled 2017-01-24: qty 4

## 2017-01-24 MED ORDER — BENZOCAINE-MENTHOL 20-0.5 % EX AERO: 1.0000 "application " | INHALATION_SPRAY | CUTANEOUS | Status: DC | PRN

## 2017-01-24 MED ORDER — IBUPROFEN 600 MG PO TABS
600.0000 mg | ORAL_TABLET | Freq: Four times a day (QID) | ORAL | Status: DC
  Administered 2017-01-24 – 2017-01-25 (×3): 600 mg via ORAL
  Filled 2017-01-24 (×4): qty 1

## 2017-01-24 MED ORDER — FERROUS SULFATE 325 (65 FE) MG PO TABS
325.0000 mg | ORAL_TABLET | Freq: Two times a day (BID) | ORAL | Status: DC
  Administered 2017-01-25: 325 mg via ORAL
  Filled 2017-01-24: qty 1

## 2017-01-24 MED ORDER — PHENYLEPHRINE 40 MCG/ML (10ML) SYRINGE FOR IV PUSH (FOR BLOOD PRESSURE SUPPORT)
80.0000 ug | PREFILLED_SYRINGE | INTRAVENOUS | Status: DC | PRN
  Filled 2017-01-24: qty 5

## 2017-01-24 MED ORDER — ONDANSETRON HCL 4 MG PO TABS
4.0000 mg | ORAL_TABLET | ORAL | Status: DC | PRN
Start: 2017-01-24 — End: 2017-01-25

## 2017-01-24 MED ORDER — TERBUTALINE SULFATE 1 MG/ML IJ SOLN
0.2500 mg | Freq: Once | INTRAMUSCULAR | Status: DC | PRN
  Filled 2017-01-24: qty 1

## 2017-01-24 MED ORDER — DIPHENHYDRAMINE HCL 25 MG PO CAPS: 25.0000 mg | ORAL_CAPSULE | Freq: Four times a day (QID) | ORAL | Status: DC | PRN

## 2017-01-24 MED ORDER — FENTANYL CITRATE (PF) 100 MCG/2ML IJ SOLN
INTRAMUSCULAR | Status: AC
  Administered 2017-01-24: 100 ug
  Filled 2017-01-24: qty 2

## 2017-01-24 MED ORDER — SOD CITRATE-CITRIC ACID 500-334 MG/5ML PO SOLN: 30.0000 mL | ORAL | Status: DC | PRN

## 2017-01-24 MED ORDER — METHYLERGONOVINE MALEATE 0.2 MG/ML IJ SOLN
0.2000 mg | Freq: Once | INTRAMUSCULAR | Status: AC
  Administered 2017-01-24 (×2): 0.2 mg via INTRAMUSCULAR

## 2017-01-24 MED ORDER — METHYLERGONOVINE MALEATE 0.2 MG/ML IJ SOLN
INTRAMUSCULAR | Status: AC
  Filled 2017-01-24: qty 1

## 2017-01-24 MED ORDER — WITCH HAZEL-GLYCERIN EX PADS: 1.0000 "application " | MEDICATED_PAD | CUTANEOUS | Status: DC | PRN

## 2017-01-24 MED ORDER — FENTANYL 2.5 MCG/ML BUPIVACAINE 1/10 % EPIDURAL INFUSION (WH - ANES)
14.0000 mL/h | INTRAMUSCULAR | Status: DC | PRN
  Administered 2017-01-24: 14 mL/h via EPIDURAL
  Filled 2017-01-24: qty 100

## 2017-01-24 MED ORDER — OXYTOCIN BOLUS FROM INFUSION
500.0000 mL | Freq: Once | INTRAVENOUS | Status: AC
  Administered 2017-01-24: 500 mL via INTRAVENOUS

## 2017-01-24 MED ORDER — LIDOCAINE HCL (PF) 1 % IJ SOLN
INTRAMUSCULAR | Status: DC | PRN
Start: 2017-01-24 — End: 2017-01-24
  Administered 2017-01-24 (×2): 4 mL via EPIDURAL

## 2017-01-24 MED ORDER — ZOLPIDEM TARTRATE 5 MG PO TABS: 5.0000 mg | ORAL_TABLET | Freq: Every evening | ORAL | Status: DC | PRN

## 2017-01-24 MED ORDER — OXYCODONE-ACETAMINOPHEN 5-325 MG PO TABS: 1.0000 | ORAL_TABLET | ORAL | Status: DC | PRN

## 2017-01-24 MED ORDER — SENNOSIDES-DOCUSATE SODIUM 8.6-50 MG PO TABS
2.0000 | ORAL_TABLET | ORAL | Status: DC
  Administered 2017-01-24: 2 via ORAL
  Filled 2017-01-24: qty 2

## 2017-01-24 MED ORDER — LACTATED RINGERS IV SOLN: 500.0000 mL | INTRAVENOUS | Status: DC | PRN

## 2017-01-24 MED ORDER — CARBOPROST TROMETHAMINE 250 MCG/ML IM SOLN
250.0000 ug | Freq: Once | INTRAMUSCULAR | Status: AC
  Administered 2017-01-24: 250 ug via INTRAMUSCULAR

## 2017-01-24 MED ORDER — LACTATED RINGERS IV SOLN: INTRAVENOUS | Status: DC

## 2017-01-24 MED ORDER — LIDOCAINE HCL (PF) 1 % IJ SOLN
30.0000 mL | INTRAMUSCULAR | Status: DC | PRN
  Filled 2017-01-24: qty 30

## 2017-01-24 NOTE — Anesthesia Postprocedure Evaluation (Signed)
Anesthesia Post Note  Patient: Haley HaroldNatasaja N Potts  Procedure(s) Performed: * No procedures listed *     Patient location during evaluation: Mother Baby Anesthesia Type: Epidural Level of consciousness: awake and alert Pain management: pain level controlled Vital Signs Assessment: post-procedure vital signs reviewed and stable Respiratory status: spontaneous breathing, nonlabored ventilation and respiratory function stable Cardiovascular status: stable Postop Assessment: no headache, no backache and epidural receding Anesthetic complications: no    Last Vitals:  Vitals:   01/24/17 1046 01/24/17 1230  BP: (!) 108/56 124/72  Pulse: 76 75  Resp:  16  Temp:  37.4 C    Last Pain:  Vitals:   01/24/17 1230  TempSrc: Oral  PainSc:    Pain Goal:                 Junious SilkGILBERT,Tamre Cass

## 2017-01-24 NOTE — H&P (Signed)
Obstetric History and Physical  Haley Potts is a 21 y.o. G1P0000 with IUP at 5348w0d presenting for SOL with ROM at 2200-2300 tonight, with clear color. Patient states she has been having  regular, every 2-3 minutes contractions, none vaginal bleeding, intact, ruptured membranes, with active fetal movement.   She has a PMH notable for   Past Medical History:  Diagnosis Date  . Medical history non-contributory     Prenatal Course Source of Care: [facility] with onset of care at 8 weeks Pregnancy complications or risks: Patient Active Problem List   Diagnosis Date Noted  . Indication for care in labor or delivery 01/24/2017  . Group B streptococcal carriage complicating pregnancy 01/05/2017  . Supervision of normal first pregnancy, antepartum 07/13/2016   She undecided on feeding method. She desires depo for postpartum contraception.   Prenatal labs and studies: ABO, Rh: --/--/A POS (07/24 0010) Antibody: NEG (07/24 0010) Rubella: 15.40 (01/10 1518) RPR: Non Reactive (04/25 1131)  HBsAg: Negative (01/10 1518)  HIV:  Non-Reactive VWU:JWJXBJYNGBS:Positive (06/14 1406) 2 hr Glucola  Normal Genetic screening normal Anatomy US normal  Prenatal Transfer Tool  Maternal Diabetes: No Genetic Screening: Normal Maternal Ultrasounds/Referrals: Normal Fetal Ultrasounds or other Referrals:  None Maternal Substance Abuse:  No Significant Maternal Medications:  None Significant Maternal Lab Results: None  Past Medical History:  Diagnosis Date  . Medical history non-contributory     Past Surgical History:  Procedure Laterality Date  . WISDOM TOOTH EXTRACTION      OB History  Gravida Para Term Preterm AB Living  1 0 0 0 0 0  SAB TAB Ectopic Multiple Live Births  0 0 0 0      # Outcome Date GA Lbr Len/2nd Weight Sex Delivery Anes PTL Lv  1 Current               Social History   Social History  . Marital status: Single    Spouse name: N/A  . Number of children: N/A  . Years of  education: N/A   Social History Main Topics  . Smoking status: Never Smoker  . Smokeless tobacco: Never Used  . Alcohol use No  . Drug use: No  . Sexual activity: Not Currently    Birth control/ protection: None   Other Topics Concern  . None   Social History Narrative  . None    History reviewed. No pertinent family history.  Prescriptions Prior to Admission  Medication Sig Dispense Refill Last Dose  . Prenat-FeAsp-Meth-FA-DHA w/o A (PRENATE PIXIE) 10-0.6-0.4-200 MG CAPS Take 1 tablet by mouth daily. 30 capsule 12 Taking    No Known Allergies  Review of Systems: Negative except for what is mentioned in HPI.  Physical Exam: BP 122/76   Pulse 92   Temp 98.1 F (36.7 C)   Resp 20   LMP 04/12/2016 (Approximate) Comment: ? October, spotted a few wks ago  SpO2 100%  CONSTITUTIONAL: Well-developed, well-nourished female in no acute distress.  SKIN: Skin is warm and dry. No rash noted. Not diaphoretic. No erythema. No pallor. PSYCHIATRIC: Normal mood and affect. Normal behavior. Normal judgment and thought content. CARDIOVASCULAR: Normal heart rate noted, regular rhythm RESPIRATORY: Effort and breath sounds normal, no problems with respiration noted ABDOMEN: Soft, nontender, nondistended, gravid.  Dilation: 7 Effacement (%): 90 Station: -1 Presentation: Vertex Exam by:: Margret ChanceNora Weatherby, RN   Pertinent Labs/Studies:   Results for orders placed or performed during the hospital encounter of 01/23/17 (from the past 24  hour(s))  CBC     Status: Abnormal   Collection Time: 01/24/17 12:10 AM  Result Value Ref Range   WBC 9.5 4.0 - 10.5 K/uL   RBC 3.99 3.87 - 5.11 MIL/uL   Hemoglobin 11.3 (L) 12.0 - 15.0 g/dL   HCT 32.4 (L) 40.1 - 02.7 %   MCV 85.5 78.0 - 100.0 fL   MCH 28.3 26.0 - 34.0 pg   MCHC 33.1 30.0 - 36.0 g/dL   RDW 25.3 66.4 - 40.3 %   Platelets 231 150 - 400 K/uL  Type and screen [facility]     Status: None   Collection Time: 01/24/17  12:10 AM  Result Value Ref Range   ABO/RH(D) A POS    Antibody Screen NEG    Sample Expiration 01/27/2017     Assessment : Haley Potts is a 21 y.o. G1P0000 at [redacted]w[redacted]d being admitted for SOL  Plan: Labor: Expectant management with augmentation as needed. Analgesia as needed epidural in place  FWB: Reassuring fetal heart tracing, GBS positive Delivery plan: Hopeful for vaginal delivery   Sullivan Lone, Medical Student   I confirm that I have verified the information documented in the medical student's note and that I have also personally reperformed the physical exam and all medical decision making activities.  Raelyn Mora, CNM 01/24/2017 4:15 AM

## 2017-01-24 NOTE — Anesthesia Preprocedure Evaluation (Signed)
Anesthesia Evaluation  Patient identified by MRN, date of birth, ID band Patient awake    Reviewed: Allergy & Precautions, Patient's Chart, lab work & pertinent test results  Airway Mallampati: II  TM Distance: >3 FB Neck ROM: Full    Dental no notable dental hx. (+) Teeth Intact   Pulmonary    Pulmonary exam normal breath sounds clear to auscultation       Cardiovascular Normal cardiovascular exam Rhythm:Regular Rate:Normal     Neuro/Psych negative neurological ROS  negative psych ROS   GI/Hepatic Neg liver ROS, GERD  Medicated,  Endo/Other  negative endocrine ROS  Renal/GU negative Renal ROS  negative genitourinary   Musculoskeletal negative musculoskeletal ROS (+)   Abdominal   Peds  Hematology  (+) anemia ,   Anesthesia Other Findings   Reproductive/Obstetrics (+) Pregnancy                             Anesthesia Physical Anesthesia Plan  ASA: II  Anesthesia Plan: Epidural   Post-op Pain Management:    Induction:   PONV Risk Score and Plan: 2 and Treatment may vary due to age or medical condition  Airway Management Planned: Natural Airway  Additional Equipment:   Intra-op Plan:   Post-operative Plan:   Informed Consent: I have reviewed the patients History and Physical, chart, labs and discussed the procedure including the risks, benefits and alternatives for the proposed anesthesia with the patient or authorized representative who has indicated his/her understanding and acceptance.     Plan Discussed with: Anesthesiologist  Anesthesia Plan Comments:         Anesthesia Quick Evaluation

## 2017-01-24 NOTE — Anesthesia Procedure Notes (Signed)
Epidural Patient location during procedure: OB Start time: 01/24/2017 12:51 AM  Staffing Anesthesiologist: Mal AmabileFOSTER, Shandy Vi Performed: anesthesiologist   Preanesthetic Checklist Completed: patient identified, site marked, surgical consent, pre-op evaluation, timeout performed, IV checked, risks and benefits discussed and monitors and equipment checked  Epidural Patient position: sitting Prep: site prepped and draped and DuraPrep Patient monitoring: continuous pulse ox and blood pressure Approach: midline Location: L3-L4 Injection technique: LOR air  Needle:  Needle type: Tuohy  Needle gauge: 17 G Needle length: 9 cm and 9 Needle insertion depth: 4 cm Catheter type: closed end flexible Catheter size: 19 Gauge Catheter at skin depth: 9 cm Test dose: negative and Other  Assessment Events: blood not aspirated, injection not painful, no injection resistance, negative IV test and no paresthesia  Additional Notes Patient identified. Risks and benefits discussed including failed block, incomplete  Pain control, post dural puncture headache, nerve damage, paralysis, blood pressure Changes, nausea, vomiting, reactions to medications-both toxic and allergic and post Partum back pain. All questions were answered. Patient expressed understanding and wished to proceed. Sterile technique was used throughout procedure. Epidural site was Dressed with sterile barrier dressing. No paresthesias, signs of intravascular injection Or signs of intrathecal spread were encountered.  Patient was more comfortable after the epidural was dosed. Please see RN's note for documentation of vital signs and FHR which are stable.

## 2017-01-24 NOTE — Progress Notes (Signed)
LABOR PROGRESS NOTE  Haley Potts is a 21 y.o. G1P0000 at 6833w0d  admitted for SOL  Subjective: Pt resting comfortably; sleeping off and on. Denies feeling contractions.  Objective: BP (!) 100/50   Pulse 79   Temp 98.6 F (37 C) (Oral)   Resp 16   Ht 5\' 4"  (1.626 m)   Wt 61.7 kg (136 lb)   LMP 04/12/2016 (Approximate) Comment: ? October, spotted a few wks ago  SpO2 100%   BMI 23.34 kg/m  or  Vitals:   01/24/17 0402 01/24/17 0431 01/24/17 0501 01/24/17 0531  BP: (!) 102/42 109/65 112/74 (!) 100/50  Pulse: 79 90 86 79  Resp: 16     Temp: 98.6 F (37 C)     TempSrc: Oral     SpO2:      Weight:    61.7 kg (136 lb)  Height:    5\' 4"  (1.626 m)    FHR: base line of 140 with moderate variability with accelerations w/o decelerations  Dilation: 10 Dilation Complete Date: 01/24/17 Dilation Complete Time: 0510 Effacement (%): 100 Station: 0, +1 Presentation: Vertex Exam by:: Margret ChanceNora Weatherby, RN  Labs: Lab Results  Component Value Date   WBC 9.5 01/24/2017   HGB 11.3 (L) 01/24/2017   HCT 34.1 (L) 01/24/2017   MCV 85.5 01/24/2017   PLT 231 01/24/2017    Patient Active Problem List   Diagnosis Date Noted  . Indication for care in labor or delivery 01/24/2017  . Group B streptococcal carriage complicating pregnancy 01/05/2017  . Supervision of normal first pregnancy, antepartum 07/13/2016    Assessment / Plan: 21 y.o. G1P0000 at 8233w0d here for SOL  Labor: active, fully dilated  Fetal Wellbeing:  Category 1 Pain Control:  Epidural  Anticipated MOD:  Vaginal   Sullivan LoneBrannon L Inman, Medical Student 01/24/2017, 5:57 AM  I confirm that I have verified the information documented in the medical student's note and that I have also personally reperformed the physical exam and all medical decision making activities.   Raelyn MoraRolitta Song Garris, CNM 01/24/2017 6:25 AM

## 2017-01-25 LAB — BIRTH TISSUE RECOVERY COLLECTION (PLACENTA DONATION)

## 2017-01-25 LAB — RPR: RPR Ser Ql: NONREACTIVE

## 2017-01-25 MED ORDER — IBUPROFEN 600 MG PO TABS: 600.0000 mg | ORAL_TABLET | Freq: Four times a day (QID) | ORAL | 0 refills | Status: DC | PRN

## 2017-01-25 MED ORDER — FERROUS FUMARATE 325 (106 FE) MG PO TABS: 1.0000 | ORAL_TABLET | Freq: Two times a day (BID) | ORAL | 0 refills | Status: DC

## 2017-01-25 NOTE — Discharge Summary (Signed)
OB Discharge Summary     Patient Name: Haley Potts DOB: 26-Feb-1996 MRN: [id number]  Date of admission: 01/23/2017 Delivering MD: Raelyn MoraAWSON, ROLITTA   Date of discharge: 01/25/2017  Admitting diagnosis: 41 WEEKS PAIN Intrauterine pregnancy: 2780w0d     Secondary diagnosis:  Active Problems:   Indication for care in labor or delivery  SROM; active labor  Additional problems: GBS pos     Discharge diagnosis: Term Pregnancy Delivered and PPH                                                                                                Post partum procedures:none  Augmentation: AROM of forebag  Complications: Hemorrhage>104000mL  Hospital course:  Onset of Labor With Vaginal Delivery     21 y.o. yo G1P1001 at 7380w0d was admitted in Active Labor on 01/23/2017. Patient had an uncomplicated labor course as follows:  Membrane Rupture Time/Date: 3:50 AM ,01/24/2017   Intrapartum Procedures: Episiotomy: None [1]                                         Lacerations:  2nd degree [3];Vaginal [6]  Patient had a delivery of a Viable infant. 01/24/2017  Information for the patient's newborn:  Abbey ChattersGoode, Girl Blen [[id number]][[id number]]  Delivery Method: Vag-Spont   She had an immediate PPH of approx 1500cc- see delivery note for details.  Pateint had an uncomplicated postpartum course.  She is ambulating, tolerating a regular diet, passing flatus, and urinating well. Patient is discharged home in stable condition on 01/25/17. Denies dizziness or s/s anemia. She had a Hgb drop from 11.3 to 9.7.   Physical exam  Vitals:   01/24/17 1548 01/24/17 1804 01/24/17 2200 01/25/17 0547  BP: 122/62 (!) 114/55 114/67 103/64  Pulse: 95 84 74 74  Resp: 16  18 18   Temp: 99.1 F (37.3 C) 98.7 F (37.1 C) 99.6 F (37.6 C) 98.8 F (37.1 C)  TempSrc: Oral Oral Oral Oral  SpO2: 100%  100%   Weight:      Height:       General: alert and cooperative Lochia: appropriate Uterine Fundus: firm Incision: N/A DVT  Evaluation: No evidence of DVT seen on physical exam. Labs: Admission Hgb 11.3 Lab Results  Component Value Date   WBC 16.8 (H) 01/24/2017   HGB 9.7 (L) 01/24/2017   HCT 29.5 (L) 01/24/2017   MCV 85.8 01/24/2017   PLT 168 01/24/2017   CMP Latest Ref Rng & Units 06/08/2016  Glucose 65 - 99 mg/dL 76  BUN 6 - 20 mg/dL 14  Creatinine 7.820.44 - 9.561.00 mg/dL 2.130.71  Sodium 086135 - 578145 mmol/L 133(L)  Potassium 3.5 - 5.1 mmol/L 3.9  Chloride 101 - 111 mmol/L 99(L)  CO2 22 - 32 mmol/L 25  Calcium 8.9 - 10.3 mg/dL 9.9  Total Protein 6.5 - 8.1 g/dL 4.6(N8.8(H)  Total Bilirubin 0.3 - 1.2 mg/dL 0.5  Alkaline Phos 38 - 126 U/L 47  AST 15 - 41 U/L 19  ALT 14 - 54 U/L 9(L)    Discharge instruction: per After Visit Summary and "Baby and Me Booklet".  After visit meds:  Allergies as of 01/25/2017   No Known Allergies     Medication List    TAKE these medications   ferrous fumarate 325 (106 Fe) MG Tabs tablet Commonly known as:  HEMOCYTE - 106 mg FE Take 1 tablet (106 mg of iron total) by mouth 2 (two) times daily.   ibuprofen 600 MG tablet Commonly known as:  ADVIL,MOTRIN Take 1 tablet (600 mg total) by mouth every 6 (six) hours as needed.       Diet: routine diet  Activity: Advance as tolerated. Pelvic rest for 6 weeks.   Outpatient follow up:4 weeks Follow up Appt:Future Appointments Date Time Provider Department Center  02/21/2017 1:30 PM Brock BadHarper, Charles A, MD [facility] None   Follow up Visit:No Follow-up on file.  Postpartum contraception: Depo Provera  Newborn Data: Live born female  Birth Weight: 7 lb 11.8 oz (3510 g) APGAR: 7, 8  Baby Feeding: Bottle Disposition:home with mother   01/25/2017 Cam HaiSHAW, KIMBERLY, CNM  8:43 AM

## 2017-01-25 NOTE — Discharge Instructions (Signed)

## 2017-01-26 NOTE — Progress Notes (Signed)
Post discharge chart review completed.  

## 2017-01-28 LAB — BPAM RBC
Blood Product Expiration Date: 201808142359
Blood Product Expiration Date: 201808142359
UNIT TYPE AND RH: 6200
Unit Type and Rh: 6200

## 2017-01-28 LAB — TYPE AND SCREEN
ABO/RH(D): A POS
ANTIBODY SCREEN: NEGATIVE
UNIT DIVISION: 0
Unit division: 0

## 2017-02-21 ENCOUNTER — Encounter: Payer: Self-pay | Admitting: Obstetrics

## 2017-02-21 ENCOUNTER — Ambulatory Visit (INDEPENDENT_AMBULATORY_CARE_PROVIDER_SITE_OTHER): Payer: Medicaid Other | Admitting: Obstetrics

## 2017-02-21 DIAGNOSIS — Z3202 Encounter for pregnancy test, result negative: Secondary | ICD-10-CM

## 2017-02-21 DIAGNOSIS — Z30013 Encounter for initial prescription of injectable contraceptive: Secondary | ICD-10-CM

## 2017-02-21 DIAGNOSIS — Z3009 Encounter for other general counseling and advice on contraception: Secondary | ICD-10-CM

## 2017-02-21 LAB — POCT URINE PREGNANCY: PREG TEST UR: NEGATIVE

## 2017-02-21 MED ORDER — MEDROXYPROGESTERONE ACETATE 150 MG/ML IM SUSP
150.0000 mg | Freq: Once | INTRAMUSCULAR | Status: AC
  Administered 2017-02-21: 150 mg via INTRAMUSCULAR

## 2017-02-21 NOTE — Progress Notes (Signed)
Post Partum Exam  Haley Potts is a 21 y.o. G5P1001 female who presents for a postpartum visit. She is 4 weeks postpartum following a spontaneous vaginal delivery. I have fully reviewed the prenatal and intrapartum course. The delivery was at 41 gestational weeks.  Anesthesia: epidural. Postpartum course has been normal. Baby's course has been normal. Baby is feeding by bottle - Similac Advance. Bleeding staining only. Bowel function is normal. Bladder function is normal. Patient is not sexually active. Contraception method is abstinence. Postpartum depression screening:pos  The following portions of the patient's history were reviewed and updated as appropriate: allergies, current medications, past family history, past medical history, past social history, past surgical history and problem list.  Review of Systems A comprehensive review of systems was negative.    Objective:  unknown if currently breastfeeding.  General:  alert and no distress   Breasts:  inspection negative, no nipple discharge or bleeding, no masses or nodularity palpable  Lungs: clear to auscultation bilaterally  Heart:  regular rate and rhythm, S1, S2 normal, no murmur, click, rub or gallop  Abdomen: soft, non-tender; bowel sounds normal; no masses,  no organomegaly   Vulva:  normal  Vagina: normal vagina  Cervix:  no cervical motion tenderness  Corpus: normal size, contour, position, consistency, mobility, non-tender  Adnexa:  no mass, fullness, tenderness  Rectal Exam: Not performed.        Assessment:   1. Postpartum care following vaginal delivery   2. Encounter for other general counseling and advice on contraception   3. Encounter for initial prescription of injectable contraceptive Rx: - POCT urine pregnancy:  Negative - medroxyPROGESTERone (DEPO-PROVERA) injection 150 mg; Inject 1 mL (150 mg total) into the muscle once.  Plan:   1. Contraception: Depo-Provera injections 2. Depo Provera Rx 3.  Follow up in: 3 months or as needed.

## 2017-05-16 ENCOUNTER — Other Ambulatory Visit: Payer: Self-pay

## 2017-05-16 ENCOUNTER — Ambulatory Visit: Payer: Medicaid Other

## 2017-05-16 MED ORDER — MEDROXYPROGESTERONE ACETATE 150 MG/ML IM SUSP: 150.0000 mg | INTRAMUSCULAR | 1 refills | Status: DC

## 2017-05-18 ENCOUNTER — Emergency Department (HOSPITAL_COMMUNITY)
Admission: EM | Admit: 2017-05-18 | Discharge: 2017-05-18 | Disposition: A | Discharge status: Home / Self Care | Payer: Medicaid Other | Attending: Emergency Medicine | Admitting: Emergency Medicine

## 2017-05-18 ENCOUNTER — Other Ambulatory Visit: Payer: Self-pay

## 2017-05-18 ENCOUNTER — Encounter (HOSPITAL_COMMUNITY): Payer: Self-pay | Admitting: Emergency Medicine

## 2017-05-18 ENCOUNTER — Ambulatory Visit (INDEPENDENT_AMBULATORY_CARE_PROVIDER_SITE_OTHER): Payer: Medicaid Other | Admitting: Pediatrics

## 2017-05-18 DIAGNOSIS — H6692 Otitis media, unspecified, left ear: Secondary | ICD-10-CM | POA: Insufficient documentation

## 2017-05-18 DIAGNOSIS — H6690 Otitis media, unspecified, unspecified ear: Secondary | ICD-10-CM

## 2017-05-18 DIAGNOSIS — Z30013 Encounter for initial prescription of injectable contraceptive: Secondary | ICD-10-CM

## 2017-05-18 DIAGNOSIS — Z3042 Encounter for surveillance of injectable contraceptive: Secondary | ICD-10-CM | POA: Diagnosis not present

## 2017-05-18 DIAGNOSIS — H9202 Otalgia, left ear: Secondary | ICD-10-CM | POA: Diagnosis present

## 2017-05-18 MED ORDER — MEDROXYPROGESTERONE ACETATE 150 MG/ML IM SUSP
150.0000 mg | Freq: Once | INTRAMUSCULAR | Status: AC
  Administered 2017-05-18: 150 mg via INTRAMUSCULAR

## 2017-05-18 MED ORDER — AMOXICILLIN 500 MG PO CAPS: 500.0000 mg | ORAL_CAPSULE | Freq: Three times a day (TID) | ORAL | 0 refills | Status: DC

## 2017-05-18 NOTE — Discharge Instructions (Signed)
It was my pleasure taking care of you today!   You have been given a prescription for an antibiotic. If your ear does not feel better in two days, please start antibiotic.   Ibuprofen as needed for pain. No swimming. Avoid water in the ear.   Return to ER for new or worsening symptoms, any additional concerns.

## 2017-05-18 NOTE — ED Provider Notes (Signed)
[facility]-EMERGENCY DEPT Provider Note   CSN: [id number] Arrival date & time: 05/18/17  [id number]     History   Chief Complaint Chief Complaint  Patient presents with  . Otalgia    HPI Haley Potts is a 21 y.o. female.  The history is provided by the patient and medical records. No language interpreter was used.  Otalgia  Associated symptoms include sore throat. Pertinent negatives include no neck pain and no cough.   Haley Potts is a 21 y.o. female  with no pertinent who presents to the Emergency Department complaining of acute onset of left ear pain which began about two hours prior to arrival. Pain described as an aching. Associated with sore throat which began about two hours ago as well.  Denies known sick contacts.  History of similar about 2 years ago, but no history of frequent ear infections. Took a friend's pain pill prior to arrival which alleviated pain. Denies fever, chills, cough, congestion, chest pain, trouble breathing, neck pain or dental pain. No ear drainage / discharge.   Past Medical History:  Diagnosis Date  . Medical history non-contributory     Patient Active Problem List   Diagnosis Date Noted  . Indication for care in labor or delivery 01/24/2017  . Group B streptococcal carriage complicating pregnancy 01/05/2017  . Supervision of normal first pregnancy, antepartum 07/13/2016    Past Surgical History:  Procedure Laterality Date  . WISDOM TOOTH EXTRACTION      OB History    Gravida Para Term Preterm AB Living   1 1 1  0 0 1   SAB TAB Ectopic Multiple Live Births   0 0 0 0 1       Home Medications    Prior to Admission medications   Medication Sig Start Date End Date Taking? Authorizing Provider  amoxicillin (AMOXIL) 500 MG capsule Take 1 capsule (500 mg total) 3 (three) times daily by mouth. 05/18/17   Jahmari Esbenshade, Chase PicketJaime Pilcher, PA-C  ibuprofen (ADVIL,MOTRIN) 600 MG tablet Take 1 tablet (600 mg total) by mouth every  6 (six) hours as needed. 01/25/17   Arabella MerlesShaw, Kimberly D, CNM  medroxyPROGESTERone (DEPO-PROVERA) 150 MG/ML injection Inject 1 mL (150 mg total) every 3 (three) months into the muscle. 05/16/17   Brock BadHarper, Charles A, MD    Family History History reviewed. No pertinent family history.  Social History Social History   Tobacco Use  . Smoking status: Never Smoker  . Smokeless tobacco: Never Used  Substance Use Topics  . Alcohol use: No    Alcohol/week: 0.0 oz  . Drug use: No     Allergies   Patient has no known allergies.   Review of Systems Review of Systems  Constitutional: Negative for chills and fever.  HENT: Positive for ear pain and sore throat. Negative for congestion.   Respiratory: Negative for cough and shortness of breath.   Cardiovascular: Negative for chest pain.  Musculoskeletal: Negative for neck pain.     Physical Exam Updated Vital Signs BP 126/82 (BP Location: Left Arm)   Pulse (!) 102   Temp 99 F (37.2 C) (Oral)   Resp 14   Ht 5\' 4"  (1.626 m)   Wt 61.2 kg (135 lb)   SpO2 98%   BMI 23.17 kg/m   Physical Exam  Constitutional: She is oriented to person, place, and time. She appears well-developed and well-nourished. No distress.  HENT:  Head: Normocephalic and atraumatic.  Nose: Nose normal.  OP  with erythema, no exudates or tonsillar hypertrophy. Right TM normal. Left TM erythematous. No mastoid or tragal tenderness.  Neck: Normal range of motion. Neck supple.  No meningeal signs.   Cardiovascular: Normal rate, regular rhythm and normal heart sounds.  Pulmonary/Chest: Effort normal.  Lungs are clear to auscultation bilaterally - no w/r/r  Abdominal: Soft. She exhibits no distension. There is no tenderness.  Musculoskeletal: Normal range of motion.  Neurological: She is alert and oriented to person, place, and time.  Skin: Skin is warm and dry. She is not diaphoretic.  Nursing note and vitals reviewed.    ED Treatments / Results  Labs (all  labs ordered are listed, but only abnormal results are displayed) Labs Reviewed - No data to display  EKG  EKG Interpretation None       Radiology No results found.  Procedures Procedures (including critical care time)  Medications Ordered in ED Medications - No data to display   Initial Impression / Assessment and Plan / ED Course  I have reviewed the triage vital signs and the nursing notes.  Pertinent labs & imaging results that were available during my care of the patient were reviewed by me and considered in my medical decision making (see chart for details).    Haley Potts is a 21 y.o. female who presents to ED for left ear pain x 2 hours. Erythematous TM on exam. Given recent onset, recommended holding on antibiotics for 2 days to see if symptoms improve. If no improvement, start amoxil. Patient understands plan. All questions answered.   Final Clinical Impressions(s) / ED Diagnoses   Final diagnoses:  Acute otitis media, unspecified otitis media type    ED Discharge Orders        Ordered    amoxicillin (AMOXIL) 500 MG capsule  3 times daily     05/18/17 0750       Wahid Holley, Chase PicketJaime Pilcher, PA-C 05/18/17 [id number]    Linwood DibblesKnapp, Jon, MD 05/18/17 [phone]

## 2017-05-18 NOTE — ED Triage Notes (Signed)
Pt is c/o left earache that started a couple hours ago

## 2017-05-19 NOTE — Progress Notes (Signed)
Patient seen by RN for depo-provera 

## 2017-06-20 ENCOUNTER — Ambulatory Visit (INDEPENDENT_AMBULATORY_CARE_PROVIDER_SITE_OTHER): Payer: Medicaid Other | Admitting: Obstetrics

## 2017-06-20 ENCOUNTER — Encounter: Payer: Self-pay | Admitting: Obstetrics

## 2017-06-20 VITALS — BP 111/71 | HR 72 | Wt 132.0 lb

## 2017-06-20 DIAGNOSIS — F329 Major depressive disorder, single episode, unspecified: Secondary | ICD-10-CM

## 2017-06-20 NOTE — Progress Notes (Signed)
Patient states she has been emotional having crying spells. Pt says she does have personal stressor yet she handles them well and good support system. FOB not supportive. Pt delivered 01/24/2017 pt states had depression then after she had her baby. Just wanted to see could she handle it on her own.  Pt here today for consult and recommendations.

## 2017-06-21 ENCOUNTER — Encounter: Payer: Self-pay | Admitting: Obstetrics

## 2017-06-21 NOTE — Progress Notes (Signed)
Patient ID: Haley HaroldNatasaja N Potts, female   DOB: May 31, 1996, 21 y.o.   MRN: [id number]  No chief complaint on file.   HPI Haley Potts is a 21 y.o. female.  Has been depressed since delivery of baby.  Has a lot of stressors that she has been trying to handle on her on, but has been having uncontrollable crying spells.  Father of baby not very supportive but family is supportive. HPI  Past Medical History:  Diagnosis Date  . Medical history non-contributory     Past Surgical History:  Procedure Laterality Date  . WISDOM TOOTH EXTRACTION      History reviewed. No pertinent family history.  Social History Social History   Tobacco Use  . Smoking status: Never Smoker  . Smokeless tobacco: Never Used  Substance Use Topics  . Alcohol use: No    Alcohol/week: 0.0 oz  . Drug use: No    No Known Allergies  Current Outpatient Medications  Medication Sig Dispense Refill  . medroxyPROGESTERone (DEPO-PROVERA) 150 MG/ML injection Inject 1 mL (150 mg total) every 3 (three) months into the muscle. 1 mL 1  . amoxicillin (AMOXIL) 500 MG capsule Take 1 capsule (500 mg total) 3 (three) times daily by mouth. (Patient not taking: Reported on 06/20/2017) 15 capsule 0  . ibuprofen (ADVIL,MOTRIN) 600 MG tablet Take 1 tablet (600 mg total) by mouth every 6 (six) hours as needed. (Patient not taking: Reported on 06/20/2017) 30 tablet 0   No current facility-administered medications for this visit.     Review of Systems Review of Systems Constitutional: negative for fatigue and weight loss Respiratory: negative for cough and wheezing Cardiovascular: negative for chest pain, fatigue and palpitations Gastrointestinal: negative for abdominal pain and change in bowel habits Genitourinary:negative Integument/breast: negative for nipple discharge Musculoskeletal:negative for myalgias Neurological: negative for gait problems and tremors Behavioral/Psych: positive for depression Endocrine: negative  for temperature intolerance      Blood pressure 111/71, pulse 72, weight 132 lb (59.9 kg), not currently breastfeeding.  Physical Exam Physical Exam :  Deferred  >50% of 15 min visit spent on counseling and coordination of care.   Data Reviewed History  Assessment and Plan:    1. Reactive depression Rx: - Ambulatory referral to Integrated Behavioral Health    Plan    Follow up in 3 months   Orders Placed This Encounter  Procedures  . Ambulatory referral to Integrated Behavioral Health    Referral Priority:   Routine    Referral Type:   Consultation    Referral Reason:   Specialty Services Required    Requested Specialty:   Licensed Clinical Social Worker    Number of Visits Requested:   1   No orders of the defined types were placed in this encounter.

## 2017-07-27 ENCOUNTER — Ambulatory Visit: Payer: Medicaid Other | Admitting: Obstetrics

## 2017-08-07 ENCOUNTER — Encounter: Payer: Self-pay | Admitting: *Deleted

## 2017-08-07 ENCOUNTER — Encounter: Payer: Self-pay | Admitting: Medical

## 2017-08-07 ENCOUNTER — Ambulatory Visit (INDEPENDENT_AMBULATORY_CARE_PROVIDER_SITE_OTHER): Payer: Medicaid Other | Admitting: Medical

## 2017-08-07 ENCOUNTER — Ambulatory Visit: Payer: Medicaid Other

## 2017-08-07 VITALS — BP 157/62 | HR 94 | Wt 135.6 lb

## 2017-08-07 DIAGNOSIS — Z30017 Encounter for initial prescription of implantable subdermal contraceptive: Secondary | ICD-10-CM

## 2017-08-07 DIAGNOSIS — Z3049 Encounter for surveillance of other contraceptives: Secondary | ICD-10-CM

## 2017-08-07 MED ORDER — ETONOGESTREL 68 MG ~~LOC~~ IMPL
68.0000 mg | DRUG_IMPLANT | Freq: Once | SUBCUTANEOUS | Status: AC
  Administered 2017-08-07: 68 mg via SUBCUTANEOUS

## 2017-08-07 NOTE — Patient Instructions (Signed)
Etonogestrel implant What is this medicine? ETONOGESTREL (et oh noe JES trel) is a contraceptive (birth control) device. It is used to prevent pregnancy. It can be used for up to 3 years. This medicine may be used for other purposes; ask your health care provider or pharmacist if you have questions. COMMON BRAND NAME(S): Implanon, Nexplanon What should I tell my health care provider before I take this medicine? They need to know if you have any of these conditions: -abnormal vaginal bleeding -blood vessel disease or blood clots -cancer of the breast, cervix, or liver -depression -diabetes -gallbladder disease -headaches -heart disease or recent heart attack -high blood pressure -high cholesterol -kidney disease -liver disease -renal disease -seizures -tobacco smoker -an unusual or allergic reaction to etonogestrel, other hormones, anesthetics or antiseptics, medicines, foods, dyes, or preservatives -pregnant or trying to get pregnant -breast-feeding How should I use this medicine? This device is inserted just under the skin on the inner side of your upper arm by a health care professional. Talk to your pediatrician regarding the use of this medicine in children. Special care may be needed. Overdosage: If you think you have taken too much of this medicine contact a poison control center or emergency room at once. NOTE: This medicine is only for you. Do not share this medicine with others. What if I miss a dose? This does not apply. What may interact with this medicine? Do not take this medicine with any of the following medications: -amprenavir -bosentan -fosamprenavir This medicine may also interact with the following medications: -barbiturate medicines for inducing sleep or treating seizures -certain medicines for fungal infections like ketoconazole and itraconazole -grapefruit juice -griseofulvin -medicines to treat seizures like carbamazepine, felbamate, oxcarbazepine,  phenytoin, topiramate -modafinil -phenylbutazone -rifampin -rufinamide -some medicines to treat HIV infection like atazanavir, indinavir, lopinavir, nelfinavir, tipranavir, ritonavir -St. John's wort This list may not describe all possible interactions. Give your health care provider a list of all the medicines, herbs, non-prescription drugs, or dietary supplements you use. Also tell them if you smoke, drink alcohol, or use illegal drugs. Some items may interact with your medicine. What should I watch for while using this medicine? This product does not protect you against HIV infection (AIDS) or other sexually transmitted diseases. You should be able to feel the implant by pressing your fingertips over the skin where it was inserted. Contact your doctor if you cannot feel the implant, and use a non-hormonal birth control method (such as condoms) until your doctor confirms that the implant is in place. If you feel that the implant may have broken or become bent while in your arm, contact your healthcare provider. What side effects may I notice from receiving this medicine? Side effects that you should report to your doctor or health care professional as soon as possible: -allergic reactions like skin rash, itching or hives, swelling of the face, lips, or tongue -breast lumps -changes in emotions or moods -depressed mood -heavy or prolonged menstrual bleeding -pain, irritation, swelling, or bruising at the insertion site -scar at site of insertion -signs of infection at the insertion site such as fever, and skin redness, pain or discharge -signs of pregnancy -signs and symptoms of a blood clot such as breathing problems; changes in vision; chest pain; severe, sudden headache; pain, swelling, warmth in the leg; trouble speaking; sudden numbness or weakness of the face, arm or leg -signs and symptoms of liver injury like dark yellow or brown urine; general ill feeling or flu-like symptoms;  light-colored   stools; loss of appetite; nausea; right upper belly pain; unusually weak or tired; yellowing of the eyes or skin -unusual vaginal bleeding, discharge -signs and symptoms of a stroke like changes in vision; confusion; trouble speaking or understanding; severe headaches; sudden numbness or weakness of the face, arm or leg; trouble walking; dizziness; loss of balance or coordination Side effects that usually do not require medical attention (report to your doctor or health care professional if they continue or are bothersome): -acne -back pain -breast pain -changes in weight -dizziness -general ill feeling or flu-like symptoms -headache -irregular menstrual bleeding -nausea -sore throat -vaginal irritation or inflammation This list may not describe all possible side effects. Call your doctor for medical advice about side effects. You may report side effects to FDA at 1-800-FDA-1088. Where should I keep my medicine? This drug is given in a hospital or clinic and will not be stored at home. NOTE: This sheet is a summary. It may not cover all possible information. If you have questions about this medicine, talk to your doctor, pharmacist, or health care provider.  2018 Elsevier/Gold Standard (2016-01-07 11:19:22) Nexplanon Instructions After Insertion   Keep bandage clean and dry for 24 hours   May use ice/Tylenol/Ibuprofen for soreness or pain   If you develop fever, drainage or increased warmth from incision site-contact office immediately   

## 2017-08-07 NOTE — Progress Notes (Signed)
GYNECOLOGY CLINIC PROCEDURE NOTE  Ms. Haley Potts is a 22 y.o. G1P1001 here for Nexplanon insertion. No GYN concerns.  Pap smear not indicated until 22 years old. No other gynecologic concerns.  Nexplanon Insertion Procedure Patient was given informed consent, she signed consent form.  Patient does understand that irregular bleeding is a very common side effect of this medication. She was advised to have backup contraception for one week after placement. Pregnancy test in clinic today was negative.  Appropriate time out taken.  Patient's left arm was prepped and draped in the usual sterile fashion.. The ruler used to measure and mark insertion area.  Patient was prepped with alcohol swab and then injected with 3 ml of 1% lidocaine.  She was prepped with betadine, Nexplanon removed from packaging,  Device confirmed in needle, then inserted full length of needle and withdrawn per handbook instructions. Nexplanon was able to palpated in the patient's arm; patient palpated the insert herself. There was minimal blood loss.  Patient insertion site covered with guaze and a pressure bandage to reduce any bruising.  The patient tolerated the procedure well and was given post procedure instructions.   Patient advised to follow-up in June or July for annual exam and first pap smear at age 22 years  Haley Potts, Haley Crayton N, PA-C 08/07/2017 3:06 PM

## 2017-08-17 ENCOUNTER — Telehealth: Payer: Self-pay

## 2017-08-17 ENCOUNTER — Other Ambulatory Visit: Payer: Self-pay | Admitting: Obstetrics

## 2017-08-17 DIAGNOSIS — F419 Anxiety disorder, unspecified: Secondary | ICD-10-CM

## 2017-08-17 MED ORDER — BUSPIRONE HCL 7.5 MG PO TABS: 7.5000 mg | ORAL_TABLET | Freq: Two times a day (BID) | ORAL | 5 refills | Status: DC

## 2017-08-17 NOTE — Telephone Encounter (Signed)
Attempted to contact about rx that provider is sending to the pharmacy, no answer, left vm

## 2017-11-03 ENCOUNTER — Inpatient Hospital Stay (HOSPITAL_COMMUNITY)
Admission: AD | Admit: 2017-11-03 | Discharge: 2017-11-04 | Disposition: A | Discharge status: Home / Self Care | Payer: Medicaid Other | Source: Ambulatory Visit | Attending: Obstetrics & Gynecology | Admitting: Obstetrics & Gynecology

## 2017-11-03 DIAGNOSIS — R109 Unspecified abdominal pain: Secondary | ICD-10-CM | POA: Insufficient documentation

## 2017-11-03 DIAGNOSIS — Z3202 Encounter for pregnancy test, result negative: Secondary | ICD-10-CM | POA: Insufficient documentation

## 2017-11-03 NOTE — MAU Note (Signed)
Reports she has had some abd pain ( a little pinch) here and there) not currently having pain andnot really concerned with pain . All her friends thinks she is pregnant because she is acting different and her stomach is getting bigger.

## 2017-11-04 ENCOUNTER — Encounter (HOSPITAL_COMMUNITY): Payer: Self-pay | Admitting: *Deleted

## 2017-11-04 DIAGNOSIS — Z3202 Encounter for pregnancy test, result negative: Secondary | ICD-10-CM

## 2017-11-04 DIAGNOSIS — R109 Unspecified abdominal pain: Secondary | ICD-10-CM

## 2017-11-04 LAB — POCT PREGNANCY, URINE: PREG TEST UR: NEGATIVE

## 2017-11-04 NOTE — Progress Notes (Signed)
Vonzella Nipple PA in triage to discuss test results and d/c plan with pt. Pt d/c home from triage

## 2017-11-04 NOTE — Discharge Instructions (Signed)
Pregnancy Test Information °What is a pregnancy test? °A pregnancy test is used to detect the presence of human chorionic gonadotropin (hCG) in a sample of your urine or blood. hCG is a hormone produced by the cells of the placenta. The placenta is the organ that forms to nourish and support a developing baby. °This test requires a sample of either blood or urine. A pregnancy test determines whether you are pregnant or not. °How are pregnancy tests done? °Pregnancy tests are done using a home pregnancy test or having a blood or urine test done at your health care provider's office. °Home pregnancy tests require a urine sample. °· Most kits use a plastic testing device with a strip of paper that indicates whether there is hCG in your urine. °· Follow the test instructions very carefully. °· After you urinate on the test stick, markings will appear to let you know whether you are pregnant. °· For best results, use your first urine of the morning. That is when the concentration of hCG is highest. ° °Having a blood test to check for pregnancy requires a sample of blood drawn from a vein in your hand or arm. Your health care provider will send your sample to a lab for testing. Results of a pregnancy test will be positive or negative. °Is one type of pregnancy test better than another? °In some cases, a blood test will return a positive result even if a urine test was negative because blood tests are more sensitive. This means blood tests can detect hCG earlier than home pregnancy tests. °How accurate are home pregnancy tests? °Both types of pregnancy tests are very accurate. °· A blood test is about 98% accurate. °· When you are far enough along in your pregnancy and when used correctly, home pregnancy tests are equally accurate. ° °Can anything interfere with home pregnancy test results °It is possible for certain conditions to cause an inaccurate test result (false positive or false negative). °· A false positive is a  positive test result when you are not pregnant. This can happen if you: °? Are taking certain medicines, including anticonvulsants or tranquilizers. °? Have certain proteins in your blood. °· A false negative is a negative test result when you are pregnant. This can happen if you: °? Took the test before there was enough hCG to detect. A pregnancy test will not be positive in most women until 3-4 weeks after conception. °? Drank a lot of liquid before the test. Diluted urine samples can sometimes give an inaccurate result. °? Take certain medicines, such as water pills (diuretics) or some antihistamines. ° °What should I do if I have a positive pregnancy test? °If you have a positive pregnancy test, schedule an appointment with your health care provider. You might need additional testing to confirm the pregnancy. In the meantime, begin taking a prenatal vitamin, stop smoking, stop drinking alcohol, and do not use street drugs. °Talk to your health care provider about how to take care of yourself during your pregnancy. Ask about what to expect from the care you will need throughout pregnancy (prenatal care). °This information is not intended to replace advice given to you by your health care provider. Make sure you discuss any questions you have with your health care provider. °Document Released: 06/23/2003 Document Revised: 05/17/2016 Document Reviewed: 10/15/2013 °Elsevier Interactive Patient Education © 2018 Elsevier Inc. ° °

## 2017-11-04 NOTE — MAU Provider Note (Signed)
  History     CSN: [id number]  Arrival date and time: 11/03/17 2345    First Provider Initiated Contact with Patient 11/04/17 0023        Chief Complaint  Patient presents with  . Possible Pregnancy   HPI  Ms. Haley Potts is a 22 y.o. G1P1001 who presents to MAU today with complaint of intermittent abdominal pain and concern for pregnancy. She denies pain right now. She denies bleeding. She has had Nexplanon in place since February.    OB History    Gravida  1   Para  1   Term  1   Preterm  0   AB  0   Living  1     SAB  0   TAB  0   Ectopic  0   Multiple  0   Live Births  1           Past Medical History:  Diagnosis Date  . Medical history non-contributory     Past Surgical History:  Procedure Laterality Date  . WISDOM TOOTH EXTRACTION      No family history on file.  Social History   Tobacco Use  . Smoking status: Never Smoker  . Smokeless tobacco: Never Used  Substance Use Topics  . Alcohol use: No    Alcohol/week: 0.0 oz  . Drug use: No    Allergies: No Known Allergies  Medications Prior to Admission  Medication Sig Dispense Refill Last Dose  . busPIRone (BUSPAR) 7.5 MG tablet Take 1 tablet (7.5 mg total) by mouth every 12 (twelve) hours. 60 tablet 5   . medroxyPROGESTERone (DEPO-PROVERA) 150 MG/ML injection Inject 1 mL (150 mg total) every 3 (three) months into the muscle. 1 mL 1 Taking    Review of Systems  Constitutional: Negative for fever.  Gastrointestinal: Positive for abdominal pain.  Genitourinary: Negative for vaginal bleeding.   Physical Exam   Blood pressure 114/77, pulse 79, temperature 98.6 F (37 C), resp. rate 18, height  (1.626 m), weight 135 lb (61.2 kg), not currently breastfeeding.  Physical Exam  Nursing note and vitals reviewed. Constitutional: She is oriented to person, place, and time. She appears well-developed and well-nourished. No distress.  HENT:  Head: Normocephalic and atraumatic.   Cardiovascular: Normal rate.  Respiratory: Effort normal.  GI: Soft. She exhibits no distension.  Neurological: She is alert and oriented to person, place, and time.  Skin: Skin is warm and dry. No erythema.  Psychiatric: She has a normal mood and affect.    Results for orders placed or performed during the hospital encounter of 11/03/17 (from the past 24 hour(s))  Pregnancy, urine POC     Status: None   Collection Time: 11/04/17 12:20 AM  Result Value Ref Range   Preg Test, Ur NEGATIVE NEGATIVE    MAU Course  Procedures None  MDM UPT - negative  Assessment and Plan  A: Negative pregnancy test   P:  Discharge home Patient advised to follow-up with [facility] as needed Patient may return to MAU as needed or if her condition were to change or worsen  Vonzella Nipple, PA-C 11/04/2017, 12:28 AM

## 2017-12-20 ENCOUNTER — Encounter: Payer: Self-pay | Admitting: *Deleted

## 2017-12-20 ENCOUNTER — Ambulatory Visit (INDEPENDENT_AMBULATORY_CARE_PROVIDER_SITE_OTHER): Payer: Medicaid Other | Admitting: Advanced Practice Midwife

## 2017-12-20 ENCOUNTER — Encounter: Payer: Self-pay | Admitting: Advanced Practice Midwife

## 2017-12-20 VITALS — BP 106/64 | HR 82 | Ht 64.0 in | Wt 132.9 lb

## 2017-12-20 DIAGNOSIS — Z3009 Encounter for other general counseling and advice on contraception: Secondary | ICD-10-CM

## 2017-12-20 DIAGNOSIS — Z3046 Encounter for surveillance of implantable subdermal contraceptive: Secondary | ICD-10-CM | POA: Diagnosis not present

## 2017-12-20 NOTE — Progress Notes (Signed)
GYNECOLOGY CLINIC PROCEDURE NOTE  Haley Potts is a 22 y.o. G1P1001 here for Nexplanon removal.  She is unhappy with the device. She reports intermittent nausea since it was placed and irregular vaginal bleeding.  She does not desire pregnancy but plans to use condoms and consider another LARC, possibly and IUD.  Removal of LARC is not recommended and reviewed safety of device with pt today. She continues to desire removal.  She does not desire IUD placement today but wants to think about it.  Pt aware that fertility resumes today with Nexplanon removal.   No other gynecologic concerns.  Nexplanon Removal Patient identified, informed consent performed, consent signed.   Appropriate time out taken. Nexplanon site identified.  Area prepped in usual sterile fashon. One ml of 1% lidocaine was used to anesthetize the area at the distal end of the implant. A small stab incision was made right beside the implant on the distal portion.  The Nexplanon rod was grasped using hemostats and removed with some difficulty.  There was minimal blood loss. There were no complications.  3 ml of 1% lidocaine was injected around the incision for post-procedure analgesia.  Small incision was extended during removal so 4.0 vicryl used to suture for improved healing.  A pressure bandage was applied to reduce any bruising.  The patient tolerated the procedure well and was given post procedure instructions.  Patient is planning to use condoms and possibly IUD at later date for contraception.    Discussed LARCs as most effective forms of birth control.  IUD with slightly less systemic effects so may tolerate better.  If hormones do not work for pt Paragard IUD is also good option since she has hx of light periods without hormonal contraception.  Discussed benefits/risks of other methods.  Pt desires condoms for now but is considering IUD.  Printed materials sent home with pt.  Sharen CounterLisa Leftwich-Kirby, CNM 2:00 PM

## 2017-12-20 NOTE — Patient Instructions (Signed)
Incision Care, Adult An incision is a surgical cut that is made through your skin. Most incisions are closed after surgery. Your incision may be closed with stitches (sutures), staples, skin glue, or adhesive strips. You may need to return to your health care provider to have sutures or staples removed. This may occur several days to several weeks after your surgery. The incision needs to be cared for properly to prevent infection. How to care for your incision Incision care   Follow instructions from your health care provider about how to take care of your incision. Make sure you: ? Wash your hands with soap and water before you change the bandage (dressing). If soap and water are not available, use hand sanitizer. ? Change your dressing as told by your health care provider. ? Leave sutures, skin glue, or adhesive strips in place. These skin closures may need to stay in place for 2 weeks or longer. If adhesive strip edges start to loosen and curl up, you may trim the loose edges. Do not remove adhesive strips completely unless your health care provider tells you to do that.  Check your incision area every day for signs of infection. Check for: ? More redness, swelling, or pain. ? More fluid or blood. ? Warmth. ? Pus or a bad smell.  Ask your health care provider how to clean the incision. This may include: ? Using mild soap and water. ? Using a clean towel to pat the incision dry after cleaning it. ? Applying a cream or ointment. Do this only as told by your health care provider. ? Covering the incision with a clean dressing.  Ask your health care provider when you can leave the incision uncovered.  Do not take baths, swim, or use a hot tub until your health care provider approves. Ask your health care provider if you can take showers. You may only be allowed to take sponge baths for bathing. Medicines  If you were prescribed an antibiotic medicine, cream, or ointment, take or apply the  antibiotic as told by your health care provider. Do not stop taking or applying the antibiotic even if your condition improves.  Take over-the-counter and prescription medicines only as told by your health care provider. General instructions  Limit movement around your incision to improve healing. ? Avoid straining, lifting, or exercise for the first month, or for as long as told by your health care provider. ? Follow instructions from your health care provider about returning to your normal activities. ? Ask your health care provider what activities are safe.  Protect your incision from the sun when you are outside for the first 6 months, or for as long as told by your health care provider. Apply sunscreen around the scar or cover it up.  Keep all follow-up visits as told by your health care provider. This is important. Contact a health care provider if:  Your have more redness, swelling, or pain around the incision.  You have more fluid or blood coming from the incision.  Your incision feels warm to the touch.  You have pus or a bad smell coming from the incision.  You have a fever or shaking chills.  You are nauseous or you vomit.  You are dizzy.  Your sutures or staples come undone. Get help right away if:  You have a red streak coming from your incision.  Your incision bleeds through the dressing and the bleeding does not stop with gentle pressure.  The edges of   your incision open up and separate.  You have severe pain.  You have a rash.  You are confused.  You faint.  You have trouble breathing and a fast heartbeat. This information is not intended to replace advice given to you by your health care provider. Make sure you discuss any questions you have with your health care provider. Document Released: 01/07/2005 Document Revised: 02/26/2016 Document Reviewed: 01/06/2016 Elsevier Interactive Patient Education  2018 Elsevier Inc.  

## 2018-01-14 ENCOUNTER — Encounter (HOSPITAL_COMMUNITY): Payer: Self-pay | Admitting: Emergency Medicine

## 2018-01-14 ENCOUNTER — Emergency Department (HOSPITAL_COMMUNITY)
Admission: EM | Admit: 2018-01-14 | Discharge: 2018-01-14 | Disposition: A | Discharge status: Home / Self Care | Payer: Medicaid Other | Attending: Emergency Medicine | Admitting: Emergency Medicine

## 2018-01-14 ENCOUNTER — Other Ambulatory Visit: Payer: Self-pay

## 2018-01-14 DIAGNOSIS — X58XXXD Exposure to other specified factors, subsequent encounter: Secondary | ICD-10-CM | POA: Insufficient documentation

## 2018-01-14 DIAGNOSIS — Z30433 Encounter for removal and reinsertion of intrauterine contraceptive device: Secondary | ICD-10-CM | POA: Diagnosis not present

## 2018-01-14 DIAGNOSIS — S41112D Laceration without foreign body of left upper arm, subsequent encounter: Secondary | ICD-10-CM | POA: Insufficient documentation

## 2018-01-14 DIAGNOSIS — Z4802 Encounter for removal of sutures: Secondary | ICD-10-CM | POA: Insufficient documentation

## 2018-01-14 NOTE — ED Provider Notes (Signed)
[facility]-EMERGENCY DEPT Provider Note   CSN: [id number] Arrival date & time: 01/14/18  1648     History   Chief Complaint Chief Complaint  Patient presents with  . Suture / Staple Removal    HPI ALIANIS TRIMMER is a 22 y.o. female.  Patient presents for suture removal.  Patient had Implanon removed from her left upper arm approximately 1 month ago.  Stitches still in place.  They were supposed to resolve.  No other complaints.  No treatments prior to arrival.     Past Medical History:  Diagnosis Date  . Medical history non-contributory     There are no active problems to display for this patient.   Past Surgical History:  Procedure Laterality Date  . WISDOM TOOTH EXTRACTION       OB History    Gravida  1   Para  1   Term  1   Preterm  0   AB  0   Living  1     SAB  0   TAB  0   Ectopic  0   Multiple  0   Live Births  1            Home Medications    Prior to Admission medications   Not on File    Family History History reviewed. No pertinent family history.  Social History Social History   Tobacco Use  . Smoking status: Never Smoker  . Smokeless tobacco: Never Used  Substance Use Topics  . Alcohol use: No    Alcohol/week: 0.0 oz  . Drug use: No     Allergies   Patient has no known allergies.   Review of Systems Review of Systems  Musculoskeletal: Negative for myalgias.  Skin: Negative for color change and rash.     Physical Exam Updated Vital Signs BP 109/72 (BP Location: Left Arm)   Pulse 78   Temp 98.5 F (36.9 C) (Oral)   Resp 18   Wt 59.9 kg (132 lb)   LMP 12/15/2017 (Exact Date)   SpO2 99%   Breastfeeding? No   BMI 22.66 kg/m   Physical Exam  Constitutional: She appears well-developed and well-nourished.  HENT:  Head: Normocephalic and atraumatic.  Eyes: Conjunctivae are normal.  Neck: Normal range of motion. Neck supple.  Pulmonary/Chest: No respiratory distress.    Neurological: She is alert.  Skin: Skin is warm and dry.  Patient with 2 absorbable sutures in place in the left upper arm.  No surrounding erythema or redness.  Psychiatric: She has a normal mood and affect.  Nursing note and vitals reviewed.    ED Treatments / Results  Labs (all labs ordered are listed, but only abnormal results are displayed) Labs Reviewed - No data to display  EKG None  Radiology No results found.  Procedures .Suture Removal Date/Time: 01/14/2018 6:15 PM Performed by: Renne Crigler, PA-C Authorized by: Renne Crigler, PA-C   Consent:    Consent obtained:  Verbal   Consent given by:  Patient   Risks discussed:  Bleeding and pain   Alternatives discussed:  No treatment Location:    Location:  Upper extremity   Upper extremity location:  Arm   Arm location:  L upper arm Procedure details:    Wound appearance:  No signs of infection   Number of sutures removed:  2 Post-procedure details:    Post-removal:  Band-Aid applied   Patient tolerance of procedure:  Tolerated well, no  immediate complications   (including critical care time)  Medications Ordered in ED Medications - No data to display   Initial Impression / Assessment and Plan / ED Course  I have reviewed the triage vital signs and the nursing notes.  Pertinent labs & imaging results that were available during my care of the patient were reviewed by me and considered in my medical decision making (see chart for details).     Patient seen and examined.  Sutures removed without complication.  Patient counseled on signs and symptoms of infection when to return.  Vital signs reviewed and are as follows: BP 109/72 (BP Location: Left Arm)   Pulse 78   Temp 98.5 F (36.9 C) (Oral)   Resp 18   Wt 59.9 kg (132 lb)   LMP 12/15/2017 (Exact Date)   SpO2 99%   Breastfeeding? No   BMI 22.66 kg/m      Final Clinical Impressions(s) / ED Diagnoses   Final diagnoses:  Visit for suture  removal   Patient here for suture removal.  Removed without complication.  Wound is well-healing.  ED Discharge Orders    None       Renne CriglerGeiple, Odessie Polzin, Cordelia Poche-C 01/14/18 1816    Gerhard MunchLockwood, Robert, MD 01/14/18 2325

## 2018-01-14 NOTE — Discharge Instructions (Signed)
Please read and follow all provided instructions.  Your diagnoses today include:  1. Visit for suture removal     Tests performed today include:  Vital signs. See below for your results today.   Medications prescribed:   None  Home care instructions:  Follow any educational materials contained in this packet.  Follow-up instructions: Please follow-up with your primary care provider as needed for further evaluation of your symptoms.  Return instructions:   Please return to the Emergency Department if you experience worsening symptoms.   Please return if you have any other emergent concerns.  Additional Information:  Your vital signs today were: BP 109/72 (BP Location: Left Arm)    Pulse 78    Temp 98.5 F (36.9 C) (Oral)    Resp 18    Wt 59.9 kg (132 lb)    LMP 12/15/2017 (Exact Date)    SpO2 99%    Breastfeeding? No    BMI 22.66 kg/m  If your blood pressure (BP) was elevated above 135/85 this visit, please have this repeated by your doctor within one month. ---------------

## 2018-01-14 NOTE — ED Triage Notes (Signed)
Pt had her birth control implant removed from l/inner arm 1 month ago. 2 sutures never dissolve. Pt is requesting that they be removed

## 2018-03-01 ENCOUNTER — Ambulatory Visit (INDEPENDENT_AMBULATORY_CARE_PROVIDER_SITE_OTHER): Payer: Self-pay

## 2018-03-01 DIAGNOSIS — N926 Irregular menstruation, unspecified: Secondary | ICD-10-CM

## 2018-03-01 DIAGNOSIS — Z3202 Encounter for pregnancy test, result negative: Secondary | ICD-10-CM

## 2018-03-01 LAB — POCT URINE PREGNANCY: Preg Test, Ur: NEGATIVE

## 2018-03-01 NOTE — Progress Notes (Signed)
Ms. Haley Potts presents today for UPT. She has no unusual complaints.  LMP: 01/24/18    OBJECTIVE: Appears well, in no apparent distress.  OB History    Gravida  1   Para  1   Term  1   Preterm  0   AB  0   Living  1     SAB  0   TAB  0   Ectopic  0   Multiple  0   Live Births  1          Home UPT Result: Didn't do one per pt In-Office UPT result: Negative  I have reviewed the patient's medical, obstetrical, social, and family histories, and medications.   ASSESSMENT: Negative pregnancy test  PLAN:  Pt aware it may be too early to repeat pregnancy test right now. Pt to repeat UPT in 2 weeks. If UPT positive, pt will contact the office for an appt.

## 2018-03-22 ENCOUNTER — Encounter (HOSPITAL_COMMUNITY): Payer: Self-pay

## 2018-03-22 DIAGNOSIS — R110 Nausea: Secondary | ICD-10-CM | POA: Insufficient documentation

## 2018-03-22 DIAGNOSIS — N83201 Unspecified ovarian cyst, right side: Secondary | ICD-10-CM | POA: Insufficient documentation

## 2018-03-22 DIAGNOSIS — R1011 Right upper quadrant pain: Secondary | ICD-10-CM | POA: Insufficient documentation

## 2018-03-22 DIAGNOSIS — N10 Acute tubulo-interstitial nephritis: Secondary | ICD-10-CM | POA: Insufficient documentation

## 2018-03-22 NOTE — ED Triage Notes (Signed)
Pt complains of right flank pain for two days and urinary frequency

## 2018-03-23 ENCOUNTER — Emergency Department (HOSPITAL_COMMUNITY)
Admission: EM | Admit: 2018-03-23 | Discharge: 2018-03-23 | Disposition: A | Discharge status: Home / Self Care | Payer: Self-pay | Attending: Emergency Medicine | Admitting: Emergency Medicine

## 2018-03-23 ENCOUNTER — Emergency Department (HOSPITAL_COMMUNITY): Payer: Self-pay

## 2018-03-23 DIAGNOSIS — R109 Unspecified abdominal pain: Secondary | ICD-10-CM

## 2018-03-23 DIAGNOSIS — N10 Acute tubulo-interstitial nephritis: Secondary | ICD-10-CM

## 2018-03-23 DIAGNOSIS — R10A1 Flank pain, right side: Secondary | ICD-10-CM

## 2018-03-23 DIAGNOSIS — R1011 Right upper quadrant pain: Secondary | ICD-10-CM

## 2018-03-23 DIAGNOSIS — N83201 Unspecified ovarian cyst, right side: Secondary | ICD-10-CM

## 2018-03-23 LAB — URINALYSIS, ROUTINE W REFLEX MICROSCOPIC
BACTERIA UA: NONE SEEN
BILIRUBIN URINE: NEGATIVE
Glucose, UA: NEGATIVE mg/dL
Hgb urine dipstick: NEGATIVE
KETONES UR: 5 mg/dL — AB
NITRITE: NEGATIVE
PH: 5 (ref 5.0–8.0)
Protein, ur: NEGATIVE mg/dL
SPECIFIC GRAVITY, URINE: 1.026 (ref 1.005–1.030)

## 2018-03-23 LAB — COMPREHENSIVE METABOLIC PANEL
ALT: 14 U/L (ref 0–44)
ANION GAP: 7 (ref 5–15)
AST: 19 U/L (ref 15–41)
Albumin: 4.2 g/dL (ref 3.5–5.0)
Alkaline Phosphatase: 57 U/L (ref 38–126)
BUN: 12 mg/dL (ref 6–20)
CHLORIDE: 107 mmol/L (ref 98–111)
CO2: 27 mmol/L (ref 22–32)
Calcium: 9.1 mg/dL (ref 8.9–10.3)
Creatinine, Ser: 0.74 mg/dL (ref 0.44–1.00)
Glucose, Bld: 84 mg/dL (ref 70–99)
POTASSIUM: 3.6 mmol/L (ref 3.5–5.1)
Sodium: 141 mmol/L (ref 135–145)
Total Bilirubin: 0.6 mg/dL (ref 0.3–1.2)
Total Protein: 8 g/dL (ref 6.5–8.1)

## 2018-03-23 LAB — CBC
HEMATOCRIT: 39.8 % (ref 36.0–46.0)
Hemoglobin: 13.2 g/dL (ref 12.0–15.0)
MCH: 29.1 pg (ref 26.0–34.0)
MCHC: 33.2 g/dL (ref 30.0–36.0)
MCV: 87.9 fL (ref 78.0–100.0)
PLATELETS: 349 10*3/uL (ref 150–400)
RBC: 4.53 MIL/uL (ref 3.87–5.11)
RDW: 13.3 % (ref 11.5–15.5)
WBC: 8.8 10*3/uL (ref 4.0–10.5)

## 2018-03-23 LAB — I-STAT BETA HCG BLOOD, ED (MC, WL, AP ONLY): I-stat hCG, quantitative: <5 m[IU]/mL (ref <5)

## 2018-03-23 LAB — LIPASE, BLOOD: LIPASE: 33 U/L (ref 11–51)

## 2018-03-23 LAB — POC URINE PREG, ED: Preg Test, Ur: NEGATIVE

## 2018-03-23 MED ORDER — SODIUM CHLORIDE 0.9 % IV BOLUS
1000.0000 mL | Freq: Once | INTRAVENOUS | Status: AC
  Administered 2018-03-23: 1000 mL via INTRAVENOUS

## 2018-03-23 MED ORDER — ONDANSETRON HCL 4 MG/2ML IJ SOLN
4.0000 mg | Freq: Once | INTRAMUSCULAR | Status: AC
  Administered 2018-03-23: 4 mg via INTRAVENOUS
  Filled 2018-03-23: qty 2

## 2018-03-23 MED ORDER — KETOROLAC TROMETHAMINE 30 MG/ML IJ SOLN
30.0000 mg | Freq: Once | INTRAMUSCULAR | Status: AC
Start: 2018-03-23 — End: 2018-03-23
  Administered 2018-03-23: 30 mg via INTRAVENOUS
  Filled 2018-03-23: qty 1

## 2018-03-23 MED ORDER — CEPHALEXIN 500 MG PO CAPS: ORAL_CAPSULE | ORAL | 0 refills | Status: DC

## 2018-03-23 MED ORDER — SODIUM CHLORIDE 0.9 % IV SOLN
1.0000 g | Freq: Once | INTRAVENOUS | Status: AC
  Administered 2018-03-23: 1 g via INTRAVENOUS
  Filled 2018-03-23: qty 10

## 2018-03-23 MED ORDER — KETOROLAC TROMETHAMINE 30 MG/ML IJ SOLN: 30.0000 mg | Freq: Once | INTRAMUSCULAR | Status: DC

## 2018-03-23 NOTE — ED Provider Notes (Signed)
[facility] Provider Note   CSN: [id number] Arrival date & time: 03/22/18  2346     History   Chief Complaint No chief complaint on file.   HPI Haley Potts is a 22 y.o. female.  Haley Potts is a 22 y.o. Female is otherwise healthy, presents to ED for evaluation of right upper quadrant abdominal and right flank pain for the past 2 days.  She reports pain was initially intermittent but has become more persistent this evening.  She reports some nausea but no associated vomiting.  She has a low-grade fever on arrival and has reported some chills at home.  No diarrhea, melena or hematochezia, patient denies constipation.  She reports some urinary frequency but no dysuria.  Denies any vaginal discharge or vaginal bleeding, no pelvic pain.  No associated chest pain or shortness of breath, no cough.  Pain is made worse with palpation and movement.  She has not taken anything for pain prior to arrival, no other aggravating or alleviating factors.  No prior abdominal surgeries.     Past Medical History:  Diagnosis Date  . Medical history non-contributory     There are no active problems to display for this patient.   Past Surgical History:  Procedure Laterality Date  . WISDOM TOOTH EXTRACTION       OB History    Gravida  1   Para  1   Term  1   Preterm  0   AB  0   Living  1     SAB  0   TAB  0   Ectopic  0   Multiple  0   Live Births  1            Home Medications    Prior to Admission medications   Medication Sig Start Date End Date Taking? Authorizing Provider  naproxen sodium (ALEVE) 220 MG tablet Take 440 mg by mouth 2 (two) times daily as needed (pain).   Yes [provider]    Family History History reviewed. No pertinent family history.  Social History Social History   Tobacco Use  . Smoking status: Never Smoker  . Smokeless tobacco: Never Used  Substance Use Topics  . Alcohol use: No     Alcohol/week: 0.0 standard drinks  . Drug use: No     Allergies   Patient has no known allergies.   Review of Systems Review of Systems  Constitutional: Positive for chills. Negative for fever.  HENT: Negative.   Eyes: Negative for visual disturbance.  Respiratory: Negative for cough and shortness of breath.   Cardiovascular: Negative for chest pain.  Gastrointestinal: Positive for abdominal pain and nausea. Negative for blood in stool, constipation, diarrhea and vomiting.  Genitourinary: Positive for flank pain and frequency. Negative for dysuria, hematuria, pelvic pain, vaginal bleeding, vaginal discharge and vaginal pain.  Musculoskeletal: Negative for arthralgias, back pain and myalgias.  Skin: Negative for color change and rash.  Neurological: Negative for dizziness, syncope and light-headedness.     Physical Exam Updated Vital Signs BP 127/62 (BP Location: Left Arm)   Pulse 70   Temp 99.2 F (37.3 C) (Oral)   Resp 20   LMP 02/15/2018   SpO2 100%   Physical Exam  Constitutional: She is oriented to person, place, and time. She appears well-developed and well-nourished. No distress.  HENT:  Head: Normocephalic and atraumatic.  Mouth/Throat: Oropharynx is clear and moist.  Eyes: Pupils are equal, round,  and reactive to light. EOM are normal. Right eye exhibits no discharge. Left eye exhibits no discharge.  Neck: Neck supple.  Cardiovascular: Normal rate, regular rhythm, normal heart sounds and intact distal pulses. Exam reveals no gallop and no friction rub.  No murmur heard. Pulmonary/Chest: Effort normal and breath sounds normal. No stridor. No respiratory distress. She has no wheezes. She has no rales.  Respirations equal and unlabored, patient able to speak in full sentences, lungs clear to auscultation bilaterally  Abdominal: Soft. Bowel sounds are normal. She exhibits no distension and no mass. There is tenderness. There is guarding.  Abdomen soft,  nondistended, bowel sounds present throughout, there is focal tenderness with guarding in the right upper quadrant, all other quadrants nontender to palpation, there is also some tenderness over the right flank  Genitourinary:  Genitourinary Comments: Deferred by patient  Musculoskeletal: She exhibits no edema or deformity.  Neurological: She is alert and oriented to person, place, and time. Coordination normal.  Skin: Skin is warm and dry. She is not diaphoretic.  Psychiatric: She has a normal mood and affect. Her behavior is normal.  Nursing note and vitals reviewed.    ED Treatments / Results  Labs (all labs ordered are listed, but only abnormal results are displayed) Labs Reviewed  URINALYSIS, ROUTINE W REFLEX MICROSCOPIC - Abnormal; Notable for the following components:      Result Value   APPearance HAZY (*)    Ketones, ur 5 (*)    Leukocytes, UA TRACE (*)    All other components within normal limits  LIPASE, BLOOD  COMPREHENSIVE METABOLIC PANEL  CBC  I-STAT BETA HCG BLOOD, ED ([facility], [facility], [facility] ONLY)  POC URINE PREG, ED    EKG None  Radiology Ct Renal Stone Study  Result Date: 03/23/2018 CLINICAL DATA:  Initial evaluation for acute flank pain. EXAM: CT ABDOMEN AND PELVIS WITHOUT CONTRAST TECHNIQUE: Multidetector CT imaging of the abdomen and pelvis was performed following the standard protocol without IV contrast. COMPARISON:  None. FINDINGS: Lower chest: Mild subsegmental atelectatic changes seen within the visualized lung bases. Visualized lungs are otherwise clear. Hepatobiliary: Liver demonstrates a normal unenhanced appearance. Gallbladder within normal limits. No biliary dilatation. Pancreas: Pancreas within normal limits. Spleen: Spleen within normal limits. Adrenals/Urinary Tract: Adrenal glands grossly unremarkable. Kidneys equal in size without evidence for nephrolithiasis or hydronephrosis. No radiopaque calculi seen along the expected course of either renal collecting  system. No appreciable hydroureter. Bladder within normal limits. No layering stones within the bladder lumen. Stomach/Bowel: Stomach within normal limits. No evidence for bowel obstruction. Normal appendix. No other acute inflammatory changes about the bowels. Moderate stool within the colon. Vascular/Lymphatic: Intra-abdominal aorta of normal caliber. No appreciable adenopathy on this noncontrast examination. Reproductive: Uterus within normal limits. 15 mm right adnexal cyst, most consistent with a normal physiologic ovarian cyst. Ovaries otherwise unremarkable. Other: Small volume free fluid within the pelvis, most likely physiologic. No free intraperitoneal air. Musculoskeletal: No acute osseus abnormality. No worrisome lytic or blastic osseous lesions. IMPRESSION: 1. No CT evidence for nephrolithiasis or obstructive uropathy. 2. 15 mm simple right ovarian cyst with associated small volume free physiologic fluid within the pelvis. 3. Moderate retained stool within the colon, which could reflect constipation. 4. No other acute abnormality within the abdomen and pelvis. Normal appendix. Electronically Signed   By: Rise Mu M.D.   On: 03/23/2018 04:29   US Abdomen Limited Ruq  Result Date: 03/23/2018 CLINICAL DATA:  Initial evaluation for acute right upper quadrant  pain. EXAM: ULTRASOUND ABDOMEN LIMITED RIGHT UPPER QUADRANT COMPARISON:  None. FINDINGS: Gallbladder: No gallstones or wall thickening visualized. No sonographic Murphy sign noted by sonographer. Common bile duct: Diameter: 3.2 mm Liver: No focal lesion identified. Within normal limits in parenchymal echogenicity. Portal vein is patent on color Doppler imaging with normal direction of blood flow towards the liver. IMPRESSION: Normal right upper quadrant ultrasound. Electronically Signed   By: Rise Mu M.D.   On: 03/23/2018 03:38    Procedures Procedures (including critical care time)  Medications Ordered in  ED Medications  sodium chloride 0.9 % bolus 1,000 mL (0 mLs Intravenous Stopped 03/23/18 0431)  ondansetron (ZOFRAN) injection 4 mg (4 mg Intravenous Given 03/23/18 0230)  ketorolac (TORADOL) 30 MG/ML injection 30 mg (30 mg Intravenous Given 03/23/18 0230)  cefTRIAXone (ROCEPHIN) 1 g in sodium chloride 0.9 % 100 mL IVPB (0 g Intravenous Stopped 03/23/18 0500)     Initial Impression / Assessment and Plan / ED Course  I have reviewed the triage vital signs and the nursing notes.  Pertinent labs & imaging results that were available during my care of the patient were reviewed by me and considered in my medical decision making (see chart for details).  Patient presents for evaluation of right upper quadrant abdominal pain and flank pain for 2 days.  Nausea but no vomiting, patient does have low-grade fever here in the ED but vitals otherwise normal and patient is well-appearing.  She has had some urinary frequency but no dysuria.  She denies any vaginal discharge or vaginal bleeding, no pelvic pain.  On exam patient has focal right upper quadrant tenderness with positive Murphy sign, she also has some tenderness over the right flank.  Will get abdominal labs, right upper quadrant ultrasound.  Will treat with fluids, Toradol and Zofran for pain.  Labs overall reassuring, no leukocytosis and normal hemoglobin, no acute electrolyte derangements, normal renal and liver function, normal lipase.  Negative pregnancy.  Urinalysis does show trace leukocytes with increased WBCs in the urine, with patient's urinary frequency this could indicate urinary tract infection.  Right upper quadrant ultrasound is normal.  Given possible UTI and flank pain will get renal stone study to rule out infected stone.  No evidence of kidney stone on CT, does show a 1.5 cm right ovarian cyst as well as constipation, no other acute abnormalities and normal appendix.  Given urinary symptoms and flank pain with low-grade fever will  treat for pyelonephritis.  1 dose of Rocephin given here in ED and will discharge with Keflex.  Encourage patient to start using MiraLAX once daily to help with constipation noted on CT.  Patient will follow-up with OB/GYN regarding small ovarian cyst noted today.  Discussed appropriate return precautions.  Patient expresses understanding and is in agreement with plan.  Stable for discharge home at this time.  Final Clinical Impressions(s) / ED Diagnoses   Final diagnoses:  RUQ pain  Right flank pain  Acute pyelonephritis  Right ovarian cyst    ED Discharge Orders         Ordered    cephALEXin (KEFLEX) 500 MG capsule     03/23/18 0434           Dartha Lodge, PA-C 03/23/18 1610    Geoffery Lyons, MD 03/23/18 [phone]

## 2018-03-23 NOTE — Discharge Instructions (Signed)
Your urinary symptoms and white blood cells in your urine we will treat you for urinary tract infection, please take Keflex as directed.  This likely was causing your flank pain.  Your gallbladder appeared normal on ultrasound.  Your CT scan also showed some constipation, please take MiraLAX once daily to help promote regular bowel movements, constipation can cause an impressive amount of abdominal pain as it gets worse.  You have a small 1.5 cm ovarian cyst on your right ovary, these are typically benign and can change with menstrual cycle, please follow-up with your OB/GYN regarding this.  Return to the emergency department for worsening abdominal pain, fevers, persistent vomiting or any other new or concerning symptoms.

## 2018-03-24 ENCOUNTER — Inpatient Hospital Stay (HOSPITAL_COMMUNITY)
Admission: AD | Admit: 2018-03-24 | Discharge: 2018-03-25 | Disposition: A | Discharge status: Home / Self Care | Payer: Self-pay | Attending: Obstetrics & Gynecology | Admitting: Obstetrics & Gynecology

## 2018-03-24 DIAGNOSIS — R1011 Right upper quadrant pain: Secondary | ICD-10-CM | POA: Insufficient documentation

## 2018-03-24 DIAGNOSIS — M7918 Myalgia, other site: Secondary | ICD-10-CM | POA: Insufficient documentation

## 2018-03-24 DIAGNOSIS — N83209 Unspecified ovarian cyst, unspecified side: Secondary | ICD-10-CM | POA: Insufficient documentation

## 2018-03-24 HISTORY — DX: Depression, unspecified: F32.A

## 2018-03-24 HISTORY — DX: Major depressive disorder, single episode, unspecified: F32.9

## 2018-03-25 ENCOUNTER — Encounter (HOSPITAL_COMMUNITY): Payer: Self-pay | Admitting: *Deleted

## 2018-03-25 DIAGNOSIS — M7918 Myalgia, other site: Secondary | ICD-10-CM

## 2018-03-25 LAB — URINALYSIS, ROUTINE W REFLEX MICROSCOPIC
BILIRUBIN URINE: NEGATIVE
Glucose, UA: NEGATIVE mg/dL
Hgb urine dipstick: NEGATIVE
KETONES UR: NEGATIVE mg/dL
Nitrite: NEGATIVE
Protein, ur: NEGATIVE mg/dL
Specific Gravity, Urine: 1.018 (ref 1.005–1.030)
pH: 6 (ref 5.0–8.0)

## 2018-03-25 LAB — POCT PREGNANCY, URINE: PREG TEST UR: NEGATIVE

## 2018-03-25 LAB — WET PREP, GENITAL
Clue Cells Wet Prep HPF POC: NONE SEEN
Sperm: NONE SEEN
Trich, Wet Prep: NONE SEEN
YEAST WET PREP: NONE SEEN

## 2018-03-25 MED ORDER — KETOROLAC TROMETHAMINE 60 MG/2ML IM SOLN
60.0000 mg | Freq: Once | INTRAMUSCULAR | Status: AC
  Administered 2018-03-25: 60 mg via INTRAMUSCULAR
  Filled 2018-03-25: qty 2

## 2018-03-25 NOTE — MAU Provider Note (Addendum)
Chief Complaint: pain in right side   First Provider Initiated Contact with Patient 03/25/18 0239      SUBJECTIVE HPI: Haley Potts is a 22 y.o. G1P1001 who presents to maternity admissions reporting pain in her right flank and right upper abdomen that is [facility].  The pain is sharp, constant but waxes and wanes and wraps around from her right flank to right mid/upper abdomen.  There are no other associated symptoms.  She has not tried any treatments. She was diagnosed with ovarian cyst and UTI yesterday. She has not picked up Rx for abx for UTI.   She denies vaginal bleeding, vaginal itching/burning, urinary symptoms, h/a, dizziness, n/v, or fever/chills.     HPI  Past Medical History:  Diagnosis Date  . Depression   . Medical history non-contributory    Past Surgical History:  Procedure Laterality Date  . WISDOM TOOTH EXTRACTION     Social History   Socioeconomic History  . Marital status: Single    Spouse name: Not on file  . Number of children: Not on file  . Years of education: Not on file  . Highest education level: Not on file  Occupational History  . Not on file  Social Needs  . Financial resource strain: Not on file  . Food insecurity:    Worry: Not on file    Inability: Not on file  . Transportation needs:    Medical: Not on file    Non-medical: Not on file  Tobacco Use  . Smoking status: Never Smoker  . Smokeless tobacco: Never Used  Substance and Sexual Activity  . Alcohol use: No    Alcohol/week: 0.0 standard drinks  . Drug use: No  . Sexual activity: Yes    Partners: Male    Birth control/protection: None  Lifestyle  . Physical activity:    Days per week: Not on file    Minutes per session: Not on file  . Stress: Not on file  Relationships  . Social connections:    Talks on phone: Not on file    Gets together: Not on file    Attends religious service: Not on file    Active member of club or organization:  Not on file    Attends meetings of clubs or organizations: Not on file    Relationship status: Not on file  . Intimate partner violence:    Fear of current or ex partner: Not on file    Emotionally abused: Not on file    Physically abused: Not on file    Forced sexual activity: Not on file  Other Topics Concern  . Not on file  Social History Narrative  . Not on file   No current facility-administered medications on file prior to encounter.    Current Outpatient Medications on File Prior to Encounter  Medication Sig Dispense Refill  . naproxen sodium (ALEVE) 220 MG tablet Take 440 mg by mouth 2 (two) times daily as needed (pain).    . cephALEXin (KEFLEX) 500 MG capsule 2 caps po bid x 7 days 28 capsule 0   No Known Allergies  ROS:  Review of Systems  Constitutional: Negative for chills, fatigue and fever.  Respiratory: Negative for shortness of breath.   Cardiovascular: Negative for chest pain.  Gastrointestinal: Positive for abdominal pain. Negative for nausea and vomiting.  Genitourinary: Positive for flank pain. Negative for difficulty urinating, dysuria, pelvic pain, vaginal bleeding, vaginal discharge and vaginal pain.  Musculoskeletal: Negative for back pain.  Neurological: Negative for dizziness and headaches.  Psychiatric/Behavioral: Negative.      I have reviewed patient's Past Medical Hx, Surgical Hx, Family Hx, Social Hx, medications and allergies.   Physical Exam   Patient Vitals for the past 24 hrs:  BP Temp Pulse Resp Height Weight  03/25/18 0346 116/61 98.4 F (36.9 C) 73 18 - -  03/25/18 0006 112/71 98.5 F (36.9 C) 72 18 5\' 4"  (1.626 m) 61.2 kg   Constitutional: Well-developed, well-nourished female in no acute distress.  Cardiovascular: normal rate Respiratory: normal effort GI: Abd soft, non-tender. Pos BS x 4 MS: Extremities nontender, no edema, normal ROM Neurologic: Alert and oriented x 4.  GU: Neg CVAT.  PELVIC EXAM:    LAB  RESULTS Results for orders placed or performed during the hospital encounter of 03/24/18 (from the past 24 hour(s))  Urinalysis, Routine w reflex microscopic     Status: Abnormal   Collection Time: 03/25/18 12:17 AM  Result Value Ref Range   Color, Urine YELLOW YELLOW   APPearance HAZY (A) CLEAR   Specific Gravity, Urine 1.018 1.005 - 1.030   pH 6.0 5.0 - 8.0   Glucose, UA NEGATIVE NEGATIVE mg/dL   Hgb urine dipstick NEGATIVE NEGATIVE   Bilirubin Urine NEGATIVE NEGATIVE   Ketones, ur NEGATIVE NEGATIVE mg/dL   Protein, ur NEGATIVE NEGATIVE mg/dL   Nitrite NEGATIVE NEGATIVE   Leukocytes, UA SMALL (A) NEGATIVE   RBC / HPF 0-5 0 - 5 RBC/hpf   WBC, UA 11-20 0 - 5 WBC/hpf   Bacteria, UA RARE (A) NONE SEEN   Squamous Epithelial / LPF 11-20 0 - 5   Mucus PRESENT   Pregnancy, urine POC     Status: None   Collection Time: 03/25/18 12:31 AM  Result Value Ref Range   Preg Test, Ur NEGATIVE NEGATIVE  Wet prep, genital     Status: Abnormal   Collection Time: 03/25/18  2:16 AM  Result Value Ref Range   Yeast Wet Prep HPF POC NONE SEEN NONE SEEN   Trich, Wet Prep NONE SEEN NONE SEEN   Clue Cells Wet Prep HPF POC NONE SEEN NONE SEEN   WBC, Wet Prep HPF POC MANY (A) NONE SEEN   Sperm NONE SEEN        IMAGING Ct Renal Stone Study  Result Date: 03/23/2018 CLINICAL DATA:  Initial evaluation for acute flank pain. EXAM: CT ABDOMEN AND PELVIS WITHOUT CONTRAST TECHNIQUE: Multidetector CT imaging of the abdomen and pelvis was performed following the standard protocol without IV contrast. COMPARISON:  None. FINDINGS: Lower chest: Mild subsegmental atelectatic changes seen within the visualized lung bases. Visualized lungs are otherwise clear. Hepatobiliary: Liver demonstrates a normal unenhanced appearance. Gallbladder within normal limits. No biliary dilatation. Pancreas: Pancreas within normal limits. Spleen: Spleen within normal limits. Adrenals/Urinary Tract: Adrenal glands grossly unremarkable.  Kidneys equal in size without evidence for nephrolithiasis or hydronephrosis. No radiopaque calculi seen along the expected course of either renal collecting system. No appreciable hydroureter. Bladder within normal limits. No layering stones within the bladder lumen. Stomach/Bowel: Stomach within normal limits. No evidence for bowel obstruction. Normal appendix. No other acute inflammatory changes about the bowels. Moderate stool within the colon. Vascular/Lymphatic: Intra-abdominal aorta of normal caliber. No appreciable adenopathy on this noncontrast examination. Reproductive: Uterus within normal limits. 15 mm right adnexal cyst, most consistent with a normal physiologic ovarian cyst. Ovaries otherwise unremarkable. Other: Small volume free fluid within the pelvis, most likely  physiologic. No free intraperitoneal air. Musculoskeletal: No acute osseus abnormality. No worrisome lytic or blastic osseous lesions. IMPRESSION: 1. No CT evidence for nephrolithiasis or obstructive uropathy. 2. 15 mm simple right ovarian cyst with associated small volume free physiologic fluid within the pelvis. 3. Moderate retained stool within the colon, which could reflect constipation. 4. No other acute abnormality within the abdomen and pelvis. Normal appendix. Electronically Signed   By: Rise Mu M.D.   On: 03/23/2018 04:29   US Abdomen Limited Ruq  Result Date: 03/23/2018 CLINICAL DATA:  Initial evaluation for acute right upper quadrant pain. EXAM: ULTRASOUND ABDOMEN LIMITED RIGHT UPPER QUADRANT COMPARISON:  None. FINDINGS: Gallbladder: No gallstones or wall thickening visualized. No sonographic Murphy sign noted by sonographer. Common bile duct: Diameter: 3.2 mm Liver: No focal lesion identified. Within normal limits in parenchymal echogenicity. Portal vein is patent on color Doppler imaging with normal direction of blood flow towards the liver. IMPRESSION: Normal right upper quadrant ultrasound. Electronically  Signed   By: Rise Mu M.D.   On: 03/23/2018 03:38    MAU Management/MDM: Reviewed labwork and imaging from yesterday's ED visit.  No acute abdomen today, no rebound tenderness.  No CVA tenderness or urinary symptoms.  Most likely pain is musculoskeletal, could be constocondritis. NSAIDs, rest, ice/heat PRN.   Treatments in MAU included Toradol 60 mg IM. Pt discharged with strict return precautions.  ASSESSMENT 1. Musculoskeletal pain     PLAN Discharge home Allergies as of 03/25/2018   No Known Allergies     Medication List    TAKE these medications   cephALEXin 500 MG capsule Commonly known as:  KEFLEX 2 caps po bid x 7 days   naproxen sodium 220 MG tablet Commonly known as:  ALEVE Take 440 mg by mouth 2 (two) times daily as needed (pain).      Follow-up Information    Primary Care or Urgent Care Follow up.   Why:  If symptoms continue. Return to Emergency Room with any emergencies.          Sharen Counter Certified Nurse-Midwife 03/25/2018  5:05 AM

## 2018-03-25 NOTE — MAU Note (Addendum)
Pain R lateral side for 3 days. [facility] and told had ovarian cyst on R side. Pain is worse today and is constant but worse when I take a deep breath or with movement. Told may have uti and Keflex ordered but pt has not picked up yet

## 2018-03-25 NOTE — Progress Notes (Signed)
Swabs obtained by blind swab.  

## 2018-03-25 NOTE — Progress Notes (Signed)
Written and verbal d/c instructions given and understanding voiced. 

## 2018-03-26 LAB — GC/CHLAMYDIA PROBE AMP (~~LOC~~) NOT AT ARMC
Chlamydia: POSITIVE — AB
Neisseria Gonorrhea: NEGATIVE

## 2018-03-28 ENCOUNTER — Other Ambulatory Visit: Payer: Self-pay | Admitting: Advanced Practice Midwife

## 2018-03-28 MED ORDER — AZITHROMYCIN 500 MG PO TABS: 1000.0000 mg | ORAL_TABLET | Freq: Once | ORAL | 0 refills | Status: AC

## 2018-06-08 ENCOUNTER — Inpatient Hospital Stay (HOSPITAL_COMMUNITY)
Admission: AD | Admit: 2018-06-08 | Discharge: 2018-06-08 | Disposition: A | Discharge status: Home / Self Care | Payer: Medicaid Other | Source: Ambulatory Visit | Attending: Obstetrics and Gynecology | Admitting: Obstetrics and Gynecology

## 2018-06-08 DIAGNOSIS — N912 Amenorrhea, unspecified: Secondary | ICD-10-CM | POA: Insufficient documentation

## 2018-06-08 NOTE — MAU Note (Signed)
Pt stated she took a pregnancy test today and it was positive.  No complaints other than loss of appetite.

## 2018-06-08 NOTE — MAU Provider Note (Signed)
Ms. Haley Potts is a 22 y.o. G1P1001 who present to MAU today for pregnancy confirmation. She denies abdominal pain or vaginal bleeding.   BP 108/67   Pulse 74   Temp 99.3 F (37.4 C)   Resp 18   Ht 5\' 4"  (1.626 m)   Wt 61.7 kg   BMI 23.34 kg/m  CONSTITUTIONAL: Well-developed, well-nourished female in no acute distress.  CARDIOVASCULAR: Normal heart rate noted RESPIRATORY: Effort and breath sounds normal GASTROINTESTINAL:Soft, no distention noted.  No tenderness, rebound or guarding.  SKIN: Skin is warm and dry. No rash noted. Not diaphoretic. No erythema. No pallor. PSYCHIATRIC: Normal mood and affect. Normal behavior. Normal judgment and thought content.  MDM Medical screening exam complete Patient does not endorse any symptoms concerning for ectopic pregnancy or pregnancy related complication today.   A:  Amenorrhea  P: Discharge home Patient advised that she can present as a walk-in to [facility] for a pregnancy test M-Th between 8am-4pm or Friday between 8am -11am Reasons to return to MAU reviewed  Patient may return to MAU as needed or if her condition were to change or worsen  Mcneil SoberRogers, Nishan Ovens C, CNM 06/08/2018 6:53 PM

## 2018-06-08 NOTE — MAU Note (Signed)
Pt instructed to return to clinic on Monday to get pregnancy verification.

## 2018-06-29 ENCOUNTER — Encounter (HOSPITAL_COMMUNITY): Payer: Self-pay | Admitting: *Deleted

## 2018-06-29 ENCOUNTER — Inpatient Hospital Stay (HOSPITAL_COMMUNITY)
Admission: AD | Admit: 2018-06-29 | Discharge: 2018-06-29 | Disposition: A | Discharge status: Home / Self Care | Payer: Medicaid Other | Attending: Obstetrics & Gynecology | Admitting: Obstetrics & Gynecology

## 2018-06-29 ENCOUNTER — Inpatient Hospital Stay (HOSPITAL_COMMUNITY): Payer: Medicaid Other

## 2018-06-29 ENCOUNTER — Other Ambulatory Visit: Payer: Self-pay | Admitting: Advanced Practice Midwife

## 2018-06-29 DIAGNOSIS — O209 Hemorrhage in early pregnancy, unspecified: Secondary | ICD-10-CM | POA: Diagnosis not present

## 2018-06-29 DIAGNOSIS — O3680X0 Pregnancy with inconclusive fetal viability, not applicable or unspecified: Secondary | ICD-10-CM | POA: Diagnosis not present

## 2018-06-29 DIAGNOSIS — Z3A01 Less than 8 weeks gestation of pregnancy: Secondary | ICD-10-CM | POA: Diagnosis not present

## 2018-06-29 DIAGNOSIS — Z3A08 8 weeks gestation of pregnancy: Secondary | ICD-10-CM | POA: Diagnosis not present

## 2018-06-29 LAB — CBC WITH DIFFERENTIAL/PLATELET
Basophils Absolute: 0 10*3/uL (ref 0.0–0.1)
Basophils Relative: 0 %
Eosinophils Absolute: 0.1 10*3/uL (ref 0.0–0.5)
Eosinophils Relative: 3 %
HEMATOCRIT: 40.9 % (ref 36.0–46.0)
HEMOGLOBIN: 13.7 g/dL (ref 12.0–15.0)
LYMPHS ABS: 2 10*3/uL (ref 0.7–4.0)
LYMPHS PCT: 44 %
MCH: 29.6 pg (ref 26.0–34.0)
MCHC: 33.5 g/dL (ref 30.0–36.0)
MCV: 88.3 fL (ref 80.0–100.0)
MONO ABS: 0.3 10*3/uL (ref 0.1–1.0)
MONOS PCT: 7 %
NEUTROS ABS: 2.1 10*3/uL (ref 1.7–7.7)
Neutrophils Relative %: 46 %
Platelets: 313 10*3/uL (ref 150–400)
RBC: 4.63 MIL/uL (ref 3.87–5.11)
RDW: 13 % (ref 11.5–15.5)
WBC: 4.5 10*3/uL (ref 4.0–10.5)
nRBC: 0 % (ref 0.0–0.2)

## 2018-06-29 LAB — URINALYSIS, ROUTINE W REFLEX MICROSCOPIC
BILIRUBIN URINE: NEGATIVE
GLUCOSE, UA: NEGATIVE mg/dL
Ketones, ur: NEGATIVE mg/dL
NITRITE: NEGATIVE
Protein, ur: NEGATIVE mg/dL
SPECIFIC GRAVITY, URINE: 1.025 (ref 1.005–1.030)
pH: 7 (ref 5.0–8.0)

## 2018-06-29 LAB — POCT PREGNANCY, URINE: PREG TEST UR: POSITIVE — AB

## 2018-06-29 LAB — HCG, QUANTITATIVE, PREGNANCY: hCG, Beta Chain, Quant, S: 3527 m[IU]/mL — ABNORMAL HIGH (ref <5)

## 2018-06-29 LAB — TYPE AND SCREEN
ABO/RH(D): A POS
Antibody Screen: NEGATIVE

## 2018-06-29 NOTE — Progress Notes (Signed)
Repeat quant hCG

## 2018-06-29 NOTE — MAU Provider Note (Signed)
History     CSN: [id number]  Arrival date and time: 06/29/18 1242   First Provider Initiated Contact with Patient 06/29/18 1324      Chief Complaint  Patient presents with  . Possible Pregnancy  . Vaginal Bleeding  . Abdominal Pain   HPI Haley Potts is a 22 y.o. G2P1001 at [redacted]w[redacted]d by uncertain LMP who presents to MAU with chief complaints of abdominal cramping and vaginal bleeding.  Abdominal cramping This is a recurring problem, onset three days ago. Patient endorses bilateral lower abdominal contractions. The pain is rated as 6/10 and waxes and wanes throughout the day. Pain does not radiate and patient denies aggravating or alleviating factors. She has not taken medication or tried other treatments "I'd rather just concentrate on ignoring the pain because I don't like to take pills".  Vaginal bleeding This is a new problem, onset at about 1pm today. Patient endorses heavy vaginal bleeding which has lessened since she signed in to MAU. She denies clots, SOB, lightheadedness. Patient has sexual intercourse last night "and it just didn't feel right so we stopped".  OB History    Gravida  2   Para  1   Term  1   Preterm  0   AB  0   Living  1     SAB  0   TAB  0   Ectopic  0   Multiple  0   Live Births  1           Past Medical History:  Diagnosis Date  . Depression   . Medical history non-contributory     Past Surgical History:  Procedure Laterality Date  . WISDOM TOOTH EXTRACTION      History reviewed. No pertinent family history.  Social History   Tobacco Use  . Smoking status: Never Smoker  . Smokeless tobacco: Never Used  Substance Use Topics  . Alcohol use: No    Alcohol/week: 0.0 standard drinks  . Drug use: No    Allergies: No Known Allergies  No medications prior to admission.    Review of Systems  Constitutional: Negative for chills, fatigue and fever.  Respiratory: Negative for shortness of breath.   Gastrointestinal:  Positive for abdominal pain.  Genitourinary: Positive for vaginal bleeding.  Musculoskeletal: Negative for back pain.  Neurological: Negative for dizziness, numbness and headaches.  All other systems reviewed and are negative.  Physical Exam   Blood pressure (!) 109/59, pulse 66, temperature 98.9 F (37.2 C), temperature source Oral, resp. rate 15, height 5\' 4"  (1.626 m), weight 62.1 kg, last menstrual period 04/29/2018, SpO2 100 %, not currently breastfeeding.  Physical Exam  Nursing note and vitals reviewed. Constitutional: She is oriented to person, place, and time. She appears well-developed and well-nourished.  Cardiovascular: Normal rate.  Respiratory: Effort normal.  GI: Soft. She exhibits no distension. There is no abdominal tenderness. There is no rebound and no guarding.  Genitourinary:    Genitourinary Comments: Small amount of red vaginal bleeding upon arrival and Provider assessment in MAU   Neurological: She is alert and oriented to person, place, and time.  Skin: Skin is warm and dry.  Psychiatric: She has a normal mood and affect. Her behavior is normal. Judgment and thought content normal.    MAU Course/MDM    --Patient declines Tylenol and antiemetics in MAU  Patient Vitals for the past 24 hrs:  BP Temp Temp src Pulse Resp SpO2 Height Weight  06/29/18 1505 102/65 - -  73 - - - -  06/29/18 1254 (!) 109/59 98.9 F (37.2 C) Oral 66 15 100 % 5\' 4"  (1.626 m) 62.1 kg    Results for orders placed or performed during the hospital encounter of 06/29/18 (from the past 24 hour(s))  Urinalysis, Routine w reflex microscopic     Status: Abnormal   Collection Time: 06/29/18  1:20 PM  Result Value Ref Range   Color, Urine YELLOW YELLOW   APPearance HAZY (A) CLEAR   Specific Gravity, Urine 1.025 1.005 - 1.030   pH 7.0 5.0 - 8.0   Glucose, UA NEGATIVE NEGATIVE mg/dL   Hgb urine dipstick LARGE (A) NEGATIVE   Bilirubin Urine NEGATIVE NEGATIVE   Ketones, ur NEGATIVE NEGATIVE  mg/dL   Protein, ur NEGATIVE NEGATIVE mg/dL   Nitrite NEGATIVE NEGATIVE   Leukocytes, UA TRACE (A) NEGATIVE   RBC / HPF 0-5 0 - 5 RBC/hpf   WBC, UA 6-10 0 - 5 WBC/hpf   Bacteria, UA RARE (A) NONE SEEN   Squamous Epithelial / LPF 6-10 0 - 5   Mucus PRESENT   Pregnancy, urine POC     Status: Abnormal   Collection Time: 06/29/18  1:22 PM  Result Value Ref Range   Preg Test, Ur POSITIVE (A) NEGATIVE  CBC with Differential/Platelet     Status: None   Collection Time: 06/29/18  1:38 PM  Result Value Ref Range   WBC 4.5 4.0 - 10.5 K/uL   RBC 4.63 3.87 - 5.11 MIL/uL   Hemoglobin 13.7 12.0 - 15.0 g/dL   HCT 16.140.9 09.636.0 - 04.546.0 %   MCV 88.3 80.0 - 100.0 fL   MCH 29.6 26.0 - 34.0 pg   MCHC 33.5 30.0 - 36.0 g/dL   RDW 40.913.0 81.111.5 - 91.415.5 %   Platelets 313 150 - 400 K/uL   nRBC 0.0 0.0 - 0.2 %   Neutrophils Relative % 46 %   Neutro Abs 2.1 1.7 - 7.7 K/uL   Lymphocytes Relative 44 %   Lymphs Abs 2.0 0.7 - 4.0 K/uL   Monocytes Relative 7 %   Monocytes Absolute 0.3 0.1 - 1.0 K/uL   Eosinophils Relative 3 %   Eosinophils Absolute 0.1 0.0 - 0.5 K/uL   Basophils Relative 0 %   Basophils Absolute 0.0 0.0 - 0.1 K/uL  hCG, quantitative, pregnancy     Status: Abnormal   Collection Time: 06/29/18  1:38 PM  Result Value Ref Range   hCG, Beta Chain, Quant, S 3,527 (H) <5 mIU/mL  Type and screen [facility]     Status: None   Collection Time: 06/29/18  1:38 PM  Result Value Ref Range   ABO/RH(D) A POS    Antibody Screen NEG    Sample Expiration      07/02/2018 Performed at [facility], [address] [place], [place] [id number]      [place] Ob Less Than 14 Weeks With Ob Transvaginal  Result Date: 06/29/2018 CLINICAL DATA:  Vaginal bleeding in first trimester pregnancy. Patient is 8 weeks and 5 days pregnant based on her last menstrual period. EXAM: OBSTETRIC <14 WK US AND TRANSVAGINAL OB US TECHNIQUE: Both transabdominal and transvaginal ultrasound examinations were performed  for complete evaluation of the gestation as well as the maternal uterus, adnexal regions, and pelvic cul-de-sac. Transvaginal technique was performed to assess early pregnancy. COMPARISON:  None. FINDINGS: Intrauterine gestational sac: Single Yolk sac:  Not Visualized. Embryo:  Not Visualized. Cardiac Activity: Not Visualized. MSD: 10.9  mm   5 w   5 d Subchorionic hemorrhage:  None visualized. Maternal uterus/adnexae: No uterine masses. Cervix is closed. Ovaries and adnexa are unremarkable. No pelvic free fluid. IMPRESSION: 1. Probable early intrauterine gestational sac, but no yolk sac, fetal pole, or cardiac activity yet visualized. Recommend follow-up quantitative B-HCG levels and follow-up US in 14 days to assess viability. This recommendation follows SRU consensus guidelines: Diagnostic Criteria for Nonviable Pregnancy Early in the First Trimester. Malva Limes Engl J Med 2013; 425:9563-87; 369:1443-51. 2. No acute abnormalities. Electronically Signed   By: Amie Portlandavid  Ormond M.D.   On: 06/29/2018 14:41    Assessment and Plan  --22 y.o. G2P1001 at 2425w5d by uncertain LMP with pregnancy of unknown location --Greater than 20 minutes spent at bedside discussing next steps, ectopic and bleeding precautions --Blood Type A POSITIVE: Rhogam not indicated --Discharge home in stable condition  F/U: Return to MAU Sunday 07/01/18 after 2pm for repeat Quant hCG  Calvert CantorSamantha C Esmee Fallaw, CNM 06/29/2018, 3:03 PM

## 2018-06-29 NOTE — MAU Note (Signed)
Pt reports bleeding with pregnancy. LMP 10/27, positive preg test [facility]. Noted vaginal bleeding this am. Mild cramping in lower abd and left side.

## 2018-06-29 NOTE — Discharge Instructions (Signed)
Vaginal Bleeding During Pregnancy, First Trimester    A small amount of bleeding (spotting) from the vagina is common during early pregnancy. Sometimes the bleeding is normal and does not cause problems. At other times, though, bleeding may be a sign of something serious. Tell your doctor about any bleeding from your vagina right away.  Follow these instructions at home:  Activity  · Follow your doctor's instructions about how active you can be.  · If needed, make plans for someone to help with your normal activities.  · Do not have sex or orgasms until your doctor says that this is safe.  General instructions  · Take over-the-counter and prescription medicines only as told by your doctor.  · Watch your condition for any changes.  · Write down:  ? The number of pads you use each day.  ? How often you change pads.  ? How soaked (saturated) your pads are.  · Do not use tampons.  · Do not douche.  · If you pass any tissue from your vagina, save it to show to your doctor.  · Keep all follow-up visits as told by your doctor. This is important.  Contact a doctor if:  · You have vaginal bleeding at any time while you are pregnant.  · You have cramps.  · You have a fever.  Get help right away if:  · You have very bad cramps in your back or belly (abdomen).  · You pass large clots or a lot of tissue from your vagina.  · Your bleeding gets worse.  · You feel light-headed.  · You feel weak.  · You pass out (faint).  · You have chills.  · You are leaking fluid from your vagina.  · You have a gush of fluid from your vagina.  Summary  · Sometimes vaginal bleeding during pregnancy is normal and does not cause problems. At other times, bleeding may be a sign of something serious.  · Tell your doctor about any bleeding from your vagina right away.  · Follow your doctor's instructions about how active you can be. You may need someone to help you with your normal activities.  This information is not intended to replace advice given to  you by your health care provider. Make sure you discuss any questions you have with your health care provider.  Document Released: 11/04/2013 Document Revised: 09/21/2016 Document Reviewed: 09/21/2016  Elsevier Interactive Patient Education © 2019 Elsevier Inc.

## 2018-06-30 LAB — CULTURE, OB URINE

## 2018-07-01 ENCOUNTER — Inpatient Hospital Stay (HOSPITAL_COMMUNITY)
Admission: AD | Admit: 2018-07-01 | Discharge: 2018-07-01 | Discharge status: Against Medical Advice | Payer: Medicaid Other | Source: Ambulatory Visit | Attending: Obstetrics and Gynecology | Admitting: Obstetrics and Gynecology

## 2018-07-01 ENCOUNTER — Encounter: Payer: Self-pay | Admitting: Student

## 2018-07-01 DIAGNOSIS — Z5329 Procedure and treatment not carried out because of patient's decision for other reasons: Secondary | ICD-10-CM | POA: Insufficient documentation

## 2018-07-01 DIAGNOSIS — O039 Complete or unspecified spontaneous abortion without complication: Secondary | ICD-10-CM

## 2018-07-01 LAB — URINALYSIS, ROUTINE W REFLEX MICROSCOPIC
Bilirubin Urine: NEGATIVE
GLUCOSE, UA: NEGATIVE mg/dL
KETONES UR: 5 mg/dL — AB
Leukocytes, UA: NEGATIVE
NITRITE: NEGATIVE
PH: 5 (ref 5.0–8.0)
Protein, ur: NEGATIVE mg/dL
Specific Gravity, Urine: 1.02 (ref 1.005–1.030)

## 2018-07-01 LAB — HCG, QUANTITATIVE, PREGNANCY: hCG, Beta Chain, Quant, S: 953 m[IU]/mL — ABNORMAL HIGH (ref <5)

## 2018-07-01 NOTE — Discharge Instructions (Signed)
Miscarriage  A miscarriage is the loss of an unborn baby (fetus) before the 20th week of pregnancy. Most miscarriages happen during the first 3 months of pregnancy. Sometimes, a miscarriage can happen before a woman knows that she is pregnant.  Having a miscarriage can be an emotional experience. If you have had a miscarriage, talk with your health care provider about any questions you may have about miscarrying, the grieving process, and your plans for future pregnancy.  What are the causes?  A miscarriage may be caused by:  · Problems with the genes or chromosomes of the fetus. These problems make it impossible for the baby to develop normally. They are often the result of random errors that occur early in the development of the baby, and are not passed from parent to child (not inherited).  · Infection of the cervix or uterus.  · Conditions that affect hormone balance in the body.  · Problems with the cervix, such as the cervix opening and thinning before pregnancy is at term (cervical insufficiency).  · Problems with the uterus. These may include:  ? A uterus with an abnormal shape.  ? Fibroids in the uterus.  ? Congenital abnormalities. These are problems that were present at birth.  · Certain medical conditions.  · Smoking, drinking alcohol, or using drugs.  · Injury (trauma).  In many cases, the cause of a miscarriage is not known.  What are the signs or symptoms?  Symptoms of this condition include:  · Vaginal bleeding or spotting, with or without cramps or pain.  · Pain or cramping in the abdomen or lower back.  · Passing fluid, tissue, or blood clots from the vagina.  How is this diagnosed?  This condition may be diagnosed based on:  · A physical exam.  · Ultrasound.  · Blood tests.  · Urine tests.  How is this treated?  Treatment for a miscarriage is sometimes not necessary if you naturally pass all the tissue that was in your uterus. If necessary, this condition may be treated with:  · Dilation and  curettage (D&C). This is a procedure in which the cervix is stretched open and the lining of the uterus (endometrium) is scraped. This is done only if tissue from the fetus or placenta remains in the body (incomplete miscarriage).  · Medicines, such as:  ? Antibiotic medicine, to treat infection.  ? Medicine to help the body pass any remaining tissue.  ? Medicine to reduce (contract) the size of the uterus. These medicines may be given if you have a lot of bleeding.  If you have Rh negative blood and your baby was Rh positive, you will need a shot of a medicine called Rh immunoglobulinto protect your future babies from Rh blood problems. "Rh-negative" and "Rh-positive" refer to whether or not the blood has a specific protein found on the surface of red blood cells (Rh factor).  Follow these instructions at home:  Medicines    · Take over-the-counter and prescription medicines only as told by your health care provider.  · If you were prescribed antibiotic medicine, take it as told by your health care provider. Do not stop taking the antibiotic even if you start to feel better.  · Do not take NSAIDs, such as aspirin and ibuprofen, unless they are approved by your health care provider. These medicines can cause bleeding.  Activity  · Rest as directed. Ask your health care provider what activities are safe for you.  · Have someone   help with home and family responsibilities during this time.  General instructions  · Keep track of the number of sanitary pads you use each day and how soaked (saturated) they are. Write down this information.  · Monitor the amount of tissue or blood clots that you pass from your vagina. Save any large amounts of tissue for your health care provider to examine.  · Do not use tampons, douche, or have sex until your health care provider approves.  · To help you and your partner with the process of grieving, talk with your health care provider or seek counseling.  · When you are ready, meet with  your health care provider to discuss any important steps you should take for your health. Also, discuss steps you should take to have a healthy pregnancy in the future.  · Keep all follow-up visits as told by your health care provider. This is important.  Where to find more information  · The American Congress of Obstetricians and Gynecologists: www.acog.org  · U.S. Department of Health and Human Services Office of Women’s Health: www.womenshealth.gov  Contact a health care provider if:  · You have a fever or chills.  · You have a foul smelling vaginal discharge.  · You have more bleeding instead of less.  Get help right away if:  · You have severe cramps or pain in your back or abdomen.  · You pass blood clots or tissue from your vagina that is walnut-sized or larger.  · You soak more than 1 regular sanitary pad in an hour.  · You become light-headed or weak.  · You pass out.  · You have feelings of sadness that take over your thoughts, or you have thoughts of hurting yourself.  Summary  · Most miscarriages happen in the first 3 months of pregnancy. Sometimes miscarriage happens before a woman even knows that she is pregnant.  · Follow your health care provider's instruction for home care. Keep all follow-up appointments.  · To help you and your partner with the process of grieving, talk with your health care provider or seek counseling.  This information is not intended to replace advice given to you by your health care provider. Make sure you discuss any questions you have with your health care provider.  Document Released: 12/14/2000 Document Revised: 07/26/2016 Document Reviewed: 07/26/2016  Elsevier Interactive Patient Education © 2019 Elsevier Inc.

## 2018-07-01 NOTE — MAU Note (Addendum)
2040- Called patient- not in lobby] 2100- called patient - not in lobby 2130 - Called patient - not in lobby

## 2018-07-01 NOTE — MAU Provider Note (Signed)
History   Chief Complaint:  Vaginal Bleeding  Haley Potts is  22 y.o. G2P1001 Patient's last menstrual period was 04/29/2018.Marland Kitchen. Patient is here for follow up of quantitative HCG and ongoing surveillance of pregnancy status.   She is 6475w0d weeks gestation  by LMP.    Since her last visit, the patient is without new complaint.   The patient reports bleeding as  spotting.  Denies any pain  General ROS:  negative  Her previous Quantitative HCG values are:  Results for Haley HaroldGOODE, Anastashia N (MRN [id number]) as of 07/01/2018 20:24  Ref. Range 06/29/2018 13:38  HCG, Beta Chain, Quant, S Latest Ref Range: <5 mIU/mL 3,527 (H)   Physical Exam   Blood pressure (!) 105/56, pulse 69, temperature 98.8 F (37.1 C), temperature source Oral, resp. rate 18, height 5\' 4"  (1.626 m), weight 62.6 kg, last menstrual period 04/29/2018, not currently breastfeeding.  Focused Gynecological Exam: examination not indicated  Labs: Results for orders placed or performed during the hospital encounter of 07/01/18 (from the past 24 hour(s))  Urinalysis, Routine w reflex microscopic   Collection Time: 07/01/18  7:01 PM  Result Value Ref Range   Color, Urine YELLOW YELLOW   APPearance HAZY (A) CLEAR   Specific Gravity, Urine 1.020 1.005 - 1.030   pH 5.0 5.0 - 8.0   Glucose, UA NEGATIVE NEGATIVE mg/dL   Hgb urine dipstick LARGE (A) NEGATIVE   Bilirubin Urine NEGATIVE NEGATIVE   Ketones, ur 5 (A) NEGATIVE mg/dL   Protein, ur NEGATIVE NEGATIVE mg/dL   Nitrite NEGATIVE NEGATIVE   Leukocytes, UA NEGATIVE NEGATIVE   RBC / HPF >50 (H) 0 - 5 RBC/hpf   WBC, UA 6-10 0 - 5 WBC/hpf   Bacteria, UA RARE (A) NONE SEEN   Squamous Epithelial / LPF 6-10 0 - 5   Mucus PRESENT   Results for orders placed or performed during the hospital encounter of 07/01/18 (from the past 24 hour(s))  hCG, quantitative, pregnancy   Collection Time: 07/01/18  4:00 PM  Result Value Ref Range   hCG, Beta Chain, Quant, S 953 (H) <5 mIU/mL     Assessment: 1. Miscarriage    Plan: The patient is instructed to follow up in 1 week for a repeat HCG and 2 weeks with a provider Pain and vaginal bleeding precautions reviewed.  Patient may return to MAU as needed  Rolm BookbinderCaroline M  CNM 07/01/2018, 8:23 PM

## 2018-07-01 NOTE — MAU Note (Signed)
Saw some bleeding this afternoon when using the bathroom.  Had some pain earlier but none currently.  Pt states she was unaware she needed to be seen earlier.

## 2018-07-01 NOTE — MAU Note (Signed)
Pt called, not in lobby. Haley SpireE Lawrence NP informed that the patient is not in the lobby.

## 2018-07-01 NOTE — MAU Note (Signed)
Pt called, not in lobby 

## 2018-07-01 NOTE — MAU Note (Signed)
Pt left after triage but before being seen by provider

## 2018-07-02 ENCOUNTER — Inpatient Hospital Stay (HOSPITAL_COMMUNITY)
Admission: AD | Admit: 2018-07-02 | Discharge: 2018-07-02 | Disposition: A | Discharge status: Home / Self Care | Payer: Medicaid Other | Source: Ambulatory Visit | Attending: Obstetrics and Gynecology | Admitting: Obstetrics and Gynecology

## 2018-07-02 DIAGNOSIS — O039 Complete or unspecified spontaneous abortion without complication: Secondary | ICD-10-CM | POA: Diagnosis not present

## 2018-07-02 NOTE — MAU Provider Note (Addendum)
Ms. Haley Potts is a 22 y.o. 392P1001 female who is 3055w2d gestation by LMP; dx'd with a miscarriage on Sunday 07/01/18. She is here today without any complaints. She is here requesting to know what are the next steps in her plan of care.   After review of the MAU provider's note and patient's lab results from 07/01/18, I explained to the patient that way her HCG levels' declined she has been dx'd with a SAB. I notified her that she will need to have close follow-up with repeat HCG level in 1 week and [facility]. Explained that she may have to have HCG levels drawn weekly until the value has gone down to zero. Appt made for stat HCG drawn on Monday 07/06/2018 at 1100. Advised that she should plan to wait at least 2 hours for the results and to speak to a RN about the results and the next steps. Message sent to [facility] Pool to get patient scheduled to see a provider in 2 wks.  All questions and concerns addressed with patient. Patient verbalized an understanding of the plan of care and agrees.   Haley Potts, CNM  07/02/2018 6:48 PM

## 2018-07-02 NOTE — MAU Note (Signed)
Pt reports she was seen yesterday and she thinks she may be in the process of a miscarriage but she left early and now she just wants to know what she is supposed to do.

## 2018-07-02 NOTE — Discharge Instructions (Signed)
Miscarriage  A miscarriage is the loss of an unborn baby (fetus) before the 20th week of pregnancy. Most miscarriages happen during the first 3 months of pregnancy. Sometimes, a miscarriage can happen before a woman knows that she is pregnant.  Having a miscarriage can be an emotional experience. If you have had a miscarriage, talk with your health care provider about any questions you may have about miscarrying, the grieving process, and your plans for future pregnancy.  What are the causes?  A miscarriage may be caused by:  · Problems with the genes or chromosomes of the fetus. These problems make it impossible for the baby to develop normally. They are often the result of random errors that occur early in the development of the baby, and are not passed from parent to child (not inherited).  · Infection of the cervix or uterus.  · Conditions that affect hormone balance in the body.  · Problems with the cervix, such as the cervix opening and thinning before pregnancy is at term (cervical insufficiency).  · Problems with the uterus. These may include:  ? A uterus with an abnormal shape.  ? Fibroids in the uterus.  ? Congenital abnormalities. These are problems that were present at birth.  · Certain medical conditions.  · Smoking, drinking alcohol, or using drugs.  · Injury (trauma).  In many cases, the cause of a miscarriage is not known.  What are the signs or symptoms?  Symptoms of this condition include:  · Vaginal bleeding or spotting, with or without cramps or pain.  · Pain or cramping in the abdomen or lower back.  · Passing fluid, tissue, or blood clots from the vagina.  How is this diagnosed?  This condition may be diagnosed based on:  · A physical exam.  · Ultrasound.  · Blood tests.  · Urine tests.  How is this treated?  Treatment for a miscarriage is sometimes not necessary if you naturally pass all the tissue that was in your uterus. If necessary, this condition may be treated with:  · Dilation and  curettage (D&C). This is a procedure in which the cervix is stretched open and the lining of the uterus (endometrium) is scraped. This is done only if tissue from the fetus or placenta remains in the body (incomplete miscarriage).  · Medicines, such as:  ? Antibiotic medicine, to treat infection.  ? Medicine to help the body pass any remaining tissue.  ? Medicine to reduce (contract) the size of the uterus. These medicines may be given if you have a lot of bleeding.  If you have Rh negative blood and your baby was Rh positive, you will need a shot of a medicine called Rh immunoglobulinto protect your future babies from Rh blood problems. "Rh-negative" and "Rh-positive" refer to whether or not the blood has a specific protein found on the surface of red blood cells (Rh factor).  Follow these instructions at home:  Medicines    · Take over-the-counter and prescription medicines only as told by your health care provider.  · If you were prescribed antibiotic medicine, take it as told by your health care provider. Do not stop taking the antibiotic even if you start to feel better.  · Do not take NSAIDs, such as aspirin and ibuprofen, unless they are approved by your health care provider. These medicines can cause bleeding.  Activity  · Rest as directed. Ask your health care provider what activities are safe for you.  · Have someone   help with home and family responsibilities during this time.  General instructions  · Keep track of the number of sanitary pads you use each day and how soaked (saturated) they are. Write down this information.  · Monitor the amount of tissue or blood clots that you pass from your vagina. Save any large amounts of tissue for your health care provider to examine.  · Do not use tampons, douche, or have sex until your health care provider approves.  · To help you and your partner with the process of grieving, talk with your health care provider or seek counseling.  · When you are ready, meet with  your health care provider to discuss any important steps you should take for your health. Also, discuss steps you should take to have a healthy pregnancy in the future.  · Keep all follow-up visits as told by your health care provider. This is important.  Where to find more information  · The American Congress of Obstetricians and Gynecologists: www.acog.org  · U.S. Department of Health and Human Services Office of Women’s Health: www.womenshealth.gov  Contact a health care provider if:  · You have a fever or chills.  · You have a foul smelling vaginal discharge.  · You have more bleeding instead of less.  Get help right away if:  · You have severe cramps or pain in your back or abdomen.  · You pass blood clots or tissue from your vagina that is walnut-sized or larger.  · You soak more than 1 regular sanitary pad in an hour.  · You become light-headed or weak.  · You pass out.  · You have feelings of sadness that take over your thoughts, or you have thoughts of hurting yourself.  Summary  · Most miscarriages happen in the first 3 months of pregnancy. Sometimes miscarriage happens before a woman even knows that she is pregnant.  · Follow your health care provider's instruction for home care. Keep all follow-up appointments.  · To help you and your partner with the process of grieving, talk with your health care provider or seek counseling.  This information is not intended to replace advice given to you by your health care provider. Make sure you discuss any questions you have with your health care provider.  Document Released: 12/14/2000 Document Revised: 07/26/2016 Document Reviewed: 07/26/2016  Elsevier Interactive Patient Education © 2019 Elsevier Inc.

## 2018-07-03 ENCOUNTER — Telehealth: Payer: Self-pay | Admitting: Family Medicine

## 2018-07-03 NOTE — Telephone Encounter (Signed)
Left message, scheduled the appointment for follow up and sending patient a reminder letter.

## 2018-07-04 NOTE — L&D Delivery Note (Signed)
OB/GYN Faculty Practice Delivery Note  Haley Potts is a 23 y.o. G3P1011 s/p NSVD at [redacted]w[redacted]d. She was admitted for spontaneous labor.   ROM: clear fluid several minutes prior to delivery GBS Status:  Negative/-- (11/24 1614) Maximum Maternal Temperature: 98.8 F    Labor Progress: . Patient arrived at 8 cm dilation and was brought to the floor. While epidural was being placed she had SROM with clear fluid and shortly after delivered.   Delivery Date/Time: 06/18/2019 at 0753 Delivery: Called to room and patient was complete and pushing. Head delivered in OA positon. Tight nuchal unable to be reduced. Shoulder and body delivered in somersault fashion. Infant with spontaneous cry, placed on mother's abdomen, dried and stimulated. Cord clamped x 2 after 1-minute delay, and cut by FOB. Cord blood drawn. Placenta delivered spontaneously with gentle cord traction. Fundus firm with massage and Pitocin. Labia, perineum, vagina, and cervix inspected with no lacerations.   Placenta: 3v intact, to L&D Complications: none Lacerations: none EBL: 300 cc Analgesia: none   Infant: APGAR (1 MIN): 8  APGAR (5 MINS):  9  Weight: 3351 grams  Augustin Coupe, MD/[facility] OB/GYN Fellow, Faculty Practice

## 2018-07-09 ENCOUNTER — Ambulatory Visit: Payer: Medicaid Other | Admitting: General Practice

## 2018-07-09 DIAGNOSIS — O039 Complete or unspecified spontaneous abortion without complication: Secondary | ICD-10-CM

## 2018-07-09 NOTE — Progress Notes (Signed)
Patient was placed on the schedule as stat bhcg, but not stat bhcg is not indicated at this time as patient had confirmed SAB. Discussed with Vonzella Nipple who agrees lab today is routine.   Chase Caller RN BSN 07/09/18

## 2018-07-10 ENCOUNTER — Telehealth: Payer: Self-pay

## 2018-07-10 LAB — BETA HCG QUANT (REF LAB): hCG Quant: 22 m[IU]/mL

## 2018-07-10 NOTE — Telephone Encounter (Signed)
Notes recorded by Raelyn Mora, CNM on 07/10/2018 at 12:30 PM EST Her HCG dropped from 900+ to 22. She will probably only to return for 1 more HCG lab draw.  LM for pt that we are calling with f/u if she could please give [place] a call or respond to MyChart message.  MyChart message sent.

## 2018-07-16 ENCOUNTER — Ambulatory Visit (INDEPENDENT_AMBULATORY_CARE_PROVIDER_SITE_OTHER): Payer: Medicaid Other | Admitting: Obstetrics and Gynecology

## 2018-07-16 ENCOUNTER — Other Ambulatory Visit (HOSPITAL_COMMUNITY)
Admission: RE | Admit: 2018-07-16 | Discharge: 2018-07-16 | Disposition: A | Discharge status: Home / Self Care | Payer: Medicaid Other | Source: Ambulatory Visit | Attending: Obstetrics and Gynecology | Admitting: Obstetrics and Gynecology

## 2018-07-16 ENCOUNTER — Encounter: Payer: Self-pay | Admitting: Obstetrics and Gynecology

## 2018-07-16 VITALS — BP 106/60 | HR 59 | Ht 64.0 in | Wt 139.5 lb

## 2018-07-16 DIAGNOSIS — Z30011 Encounter for initial prescription of contraceptive pills: Secondary | ICD-10-CM

## 2018-07-16 DIAGNOSIS — Z8759 Personal history of other complications of pregnancy, childbirth and the puerperium: Secondary | ICD-10-CM

## 2018-07-16 DIAGNOSIS — Z113 Encounter for screening for infections with a predominantly sexual mode of transmission: Secondary | ICD-10-CM

## 2018-07-16 MED ORDER — NORGESTIMATE-ETH ESTRADIOL 0.25-35 MG-MCG PO TABS: 1.0000 | ORAL_TABLET | Freq: Every day | ORAL | 11 refills | Status: DC

## 2018-07-16 NOTE — Patient Instructions (Signed)

## 2018-07-16 NOTE — Progress Notes (Signed)
Patient ID: Haley Potts, female   DOB: 1996-03-05, 23 y.o.   MRN: [id number]  GYNECOLOGY PROGRESS NOTE  History:  23 y.o. G2P1011 presents to [facility] office today for follow-up visit after SAB. She reports no VB or pain since SAB.  She reports beginning SI "about 1-1.5 weeks after SAB." She has not had her period since the LMP prior to pregnancy. She denies h/a, dizziness, shortness of breath, n/v, or fever/chills.    The following portions of the patient's history were reviewed and updated as appropriate: allergies, current medications, past family history, past medical history, past social history, past surgical history and problem list. Last pap smear on N/A.  Review of Systems:  Pertinent items are noted in HPI.   Objective:  Physical Exam Blood pressure 106/60, pulse (!) 59, height 5\' 4"  (1.626 m), weight 139 lb 8 oz (63.3 kg), last menstrual period 04/29/2018. UPT NEGATIVE. VS reviewed, nursing note reviewed,  Constitutional: well developed, well nourished, no distress Abdomen: soft Neuro: alert and oriented x 3 Skin: warm, dry Psych: affect normal  Assessment & Plan:  1. Screening for STD (sexually transmitted disease) - TOC for (+) CT (tx'd in 03/2018, but no TOC was performed) - Cervicovaginal ancillary only( [place])  2. History of miscarriage, not currently pregnant - no complications; no further F/U required  3. Encounter for initial prescription of contraceptive pills  - Rx for norgestimate-ethinyl estradiol (ORTHO-CYCLEN,SPRINTEC,PREVIFEM) 0.25-35 MG-MCG tablet - Return to [facility] in 1 year for AEX with pap and renew OCP Rx or sooner for any problems  Raelyn Mora, CNM 4:51 PM

## 2018-07-17 ENCOUNTER — Other Ambulatory Visit: Payer: Self-pay | Admitting: Obstetrics and Gynecology

## 2018-07-17 DIAGNOSIS — A749 Chlamydial infection, unspecified: Secondary | ICD-10-CM

## 2018-07-17 DIAGNOSIS — N760 Acute vaginitis: Principal | ICD-10-CM

## 2018-07-17 DIAGNOSIS — B9689 Other specified bacterial agents as the cause of diseases classified elsewhere: Secondary | ICD-10-CM

## 2018-07-17 LAB — CERVICOVAGINAL ANCILLARY ONLY
BACTERIAL VAGINITIS: POSITIVE — AB
Candida vaginitis: NEGATIVE
Chlamydia: POSITIVE — AB
NEISSERIA GONORRHEA: NEGATIVE
TRICH (WINDOWPATH): NEGATIVE

## 2018-07-17 LAB — POCT PREGNANCY, URINE: PREG TEST UR: NEGATIVE

## 2018-07-17 MED ORDER — AZITHROMYCIN 500 MG PO TABS: 1000.0000 mg | ORAL_TABLET | Freq: Once | ORAL | 0 refills | Status: AC

## 2018-07-17 MED ORDER — METRONIDAZOLE 500 MG PO TABS: 500.0000 mg | ORAL_TABLET | Freq: Two times a day (BID) | ORAL | 0 refills | Status: AC

## 2018-07-17 NOTE — Progress Notes (Signed)
Patient notified via MyChart of results and Rx.

## 2018-07-18 ENCOUNTER — Encounter: Payer: Self-pay | Admitting: *Deleted

## 2018-07-18 NOTE — Progress Notes (Signed)
Pt contacted by provider via mychart regarding positive chlamydia.  STD card completed.

## 2018-07-19 ENCOUNTER — Encounter: Payer: Self-pay | Admitting: Obstetrics and Gynecology

## 2018-10-28 ENCOUNTER — Other Ambulatory Visit: Payer: Self-pay

## 2018-10-28 ENCOUNTER — Inpatient Hospital Stay (HOSPITAL_COMMUNITY)
Admission: AD | Admit: 2018-10-28 | Discharge: 2018-10-28 | Disposition: A | Discharge status: Home / Self Care | Payer: Medicaid Other | Attending: Obstetrics & Gynecology | Admitting: Obstetrics & Gynecology

## 2018-10-28 ENCOUNTER — Encounter (HOSPITAL_COMMUNITY): Payer: Self-pay

## 2018-10-28 DIAGNOSIS — Z3A01 Less than 8 weeks gestation of pregnancy: Secondary | ICD-10-CM | POA: Diagnosis not present

## 2018-10-28 DIAGNOSIS — O210 Mild hyperemesis gravidarum: Secondary | ICD-10-CM

## 2018-10-28 LAB — POCT PREGNANCY, URINE: Preg Test, Ur: POSITIVE — AB

## 2018-10-28 MED ORDER — PROMETHAZINE HCL 12.5 MG PO TABS: 12.5000 mg | ORAL_TABLET | Freq: Four times a day (QID) | ORAL | 1 refills | Status: DC | PRN

## 2018-10-28 NOTE — MAU Provider Note (Signed)
  History     CSN: [id number]  Arrival date and time: 10/28/18 1810   Seen by provider at 1854    Chief Complaint  Patient presents with  . Nausea   HPI Haley Potts 22 y.o. [redacted]w[redacted]d Came to [facility] ER today and was routed to MAU.  Is having nausea frequently.  Ate once today at 10 am and then has had nausea and has not eaten.  Had a miscarriage in December and was prescribed birth control pills but never took them.  Was afraid of possible side effects.  Is thinking of changing offices - previously has gone to [place] but is interested in another office.  OB History    Gravida  3   Para  1   Term  1   Preterm  0   AB  1   Living  1     SAB  1   TAB  0   Ectopic  0   Multiple  0   Live Births  1           Past Medical History:  Diagnosis Date  . Depression     Past Surgical History:  Procedure Laterality Date  . WISDOM TOOTH EXTRACTION      No family history on file.  Social History   Tobacco Use  . Smoking status: Never Smoker  . Smokeless tobacco: Never Used  Substance Use Topics  . Alcohol use: No    Alcohol/week: 0.0 standard drinks  . Drug use: No    Allergies: No Known Allergies  Medications Prior to Admission  Medication Sig Dispense Refill Last Dose  . norgestimate-ethinyl estradiol (ORTHO-CYCLEN,SPRINTEC,PREVIFEM) 0.25-35 MG-MCG tablet Take 1 tablet by mouth daily. 1 Package 11     Review of Systems  Constitutional: Negative for fever.  Respiratory: Negative for cough.   Gastrointestinal: Positive for nausea. Negative for abdominal pain and vomiting.  Genitourinary: Negative for dysuria, vaginal bleeding and vaginal discharge.   Physical Exam   Blood pressure 108/61, pulse 76, temperature 98.8 F (37.1 C), temperature source Oral, resp. rate 18, last menstrual period 09/20/2018, SpO2 98 %, not currently breastfeeding.  Physical Exam  Nursing note and vitals reviewed. Constitutional: She is oriented to person, place, and time.  She appears well-developed and well-nourished.  HENT:  Head: Normocephalic.  Eyes: EOM are normal.  Neck: Neck supple.  Cardiovascular: Normal rate.  Respiratory: No respiratory distress.  Musculoskeletal: Normal range of motion.  Neurological: She is alert and oriented to person, place, and time.  Skin: Skin is warm and dry.  Psychiatric: She has a normal mood and affect.    MAU Course  Procedures  Urine pregnancy test was positive.  MDM Reviewed ways to reduce morning sickness.  Advised getting the otc meds for morning sickness.  Assessment and Plan  Morning sickness  Plan Given proof of pregnancy letter. Prescribed phenergan 12.5 mg Given list of medications safe to take in pregnancy. Drink at least 8 8-oz glasses of water every day. Take Tylenol 325 mg 2 tablets by mouth every 4 hours if needed for pain.   Alyda Megna L Muadh Creasy 10/28/2018, 6:54 PM

## 2018-10-28 NOTE — MAU Note (Signed)
Pt reports pos. Home preg. Test.  Complains of nausea with no episodes of emesis.  Pt denies any pain or vag bleeding.

## 2018-10-28 NOTE — Discharge Instructions (Signed)
Safe Medications in Pregnancy  ° °Acne: °Benzoyl Peroxide °Salicylic Acid ° °Backache/Headache: °Tylenol: 2 regular strength every 4 hours OR °             2 Extra strength every 6 hours ° °Colds/Coughs/Allergies: °Benadryl (alcohol free) 25 mg every 6 hours as needed °Breath right strips °Claritin °Cepacol throat lozenges °Chloraseptic throat spray °Cold-Eeze- up to three times per day °Cough drops, alcohol free °Flonase  °Guaifenesin °Mucinex °Robitussin DM (plain only, alcohol free) °Saline nasal spray/drops °Sudafed (pseudoephedrine) & Actifed ** use only after [redacted] weeks gestation and if you do not have high blood pressure °Tylenol °Vicks Vaporub °Zinc lozenges °Zyrtec  ° °Constipation: °Colace °Ducolax suppositories °Fleet enema °Glycerin suppositories °Metamucil °Milk of magnesia °Miralax °Senokot °Smooth move tea ° °Diarrhea: °Kaopectate °Imodium A-D ° °*NO pepto Bismol ° °Hemorrhoids: °Anusol °Anusol HC °Preparation H °Tucks ° °Indigestion: °Tums °Maalox °Mylanta °Zantac  °Pepcid ° °Insomnia: °Benadryl (alcohol free) 25mg every 6 hours as needed °Tylenol PM °Unisom, no Gelcaps ° °Leg Cramps: °Tums °MagGel ° °Nausea/Vomiting:  °Bonine °Dramamine °Emetrol °Ginger extract °Sea bands °Meclizine  °Nausea medication to take during pregnancy:  °Unisom (doxylamine succinate 25 mg tablets) Take one tablet daily at bedtime. If symptoms are not adequately controlled, the dose can be increased to a maximum recommended dose of two tablets daily (1/2 tablet in the morning, 1/2 tablet mid-afternoon and one at bedtime). °Vitamin B6 100mg tablets. Take one tablet twice a day (up to 200 mg per day). ° °Skin Rashes: °Aveeno products °Benadryl cream or 25mg every 6 hours as needed °Calamine Lotion °1% cortisone cream ° °Yeast infection: °Gyne-lotrimin 7 °Monistat 7 ° ° °**If taking multiple medications, please check labels to avoid duplicating the same active ingredients °**take medication as directed on the label °** Do not  exceed 4000 mg of tylenol in 24 hours °**Do not take medications that contain aspirin or ibuprofen ° °  °

## 2018-11-18 ENCOUNTER — Inpatient Hospital Stay (HOSPITAL_COMMUNITY): Payer: Medicaid Other

## 2018-11-18 ENCOUNTER — Encounter (HOSPITAL_COMMUNITY): Payer: Self-pay | Admitting: Obstetrics and Gynecology

## 2018-11-18 ENCOUNTER — Other Ambulatory Visit: Payer: Self-pay

## 2018-11-18 ENCOUNTER — Inpatient Hospital Stay (HOSPITAL_COMMUNITY)
Admission: AD | Admit: 2018-11-18 | Discharge: 2018-11-18 | Disposition: A | Discharge status: Home / Self Care | Payer: Medicaid Other | Attending: Obstetrics and Gynecology | Admitting: Obstetrics and Gynecology

## 2018-11-18 DIAGNOSIS — R109 Unspecified abdominal pain: Secondary | ICD-10-CM | POA: Insufficient documentation

## 2018-11-18 DIAGNOSIS — Z3A09 9 weeks gestation of pregnancy: Secondary | ICD-10-CM | POA: Diagnosis not present

## 2018-11-18 DIAGNOSIS — Z3A08 8 weeks gestation of pregnancy: Secondary | ICD-10-CM | POA: Diagnosis not present

## 2018-11-18 DIAGNOSIS — Z3490 Encounter for supervision of normal pregnancy, unspecified, unspecified trimester: Secondary | ICD-10-CM

## 2018-11-18 DIAGNOSIS — O26891 Other specified pregnancy related conditions, first trimester: Secondary | ICD-10-CM | POA: Diagnosis not present

## 2018-11-18 LAB — URINALYSIS, ROUTINE W REFLEX MICROSCOPIC
Bilirubin Urine: NEGATIVE
Glucose, UA: NEGATIVE mg/dL
Hgb urine dipstick: NEGATIVE
Ketones, ur: 20 mg/dL — AB
Leukocytes,Ua: NEGATIVE
Nitrite: NEGATIVE
Protein, ur: NEGATIVE mg/dL
Specific Gravity, Urine: 1.026 (ref 1.005–1.030)
pH: 5 (ref 5.0–8.0)

## 2018-11-18 LAB — CBC
HCT: 39.8 % (ref 36.0–46.0)
Hemoglobin: 13.7 g/dL (ref 12.0–15.0)
MCH: 29.5 pg (ref 26.0–34.0)
MCHC: 34.4 g/dL (ref 30.0–36.0)
MCV: 85.8 fL (ref 80.0–100.0)
Platelets: 294 10*3/uL (ref 150–400)
RBC: 4.64 MIL/uL (ref 3.87–5.11)
RDW: 12 % (ref 11.5–15.5)
WBC: 8.5 10*3/uL (ref 4.0–10.5)
nRBC: 0 % (ref 0.0–0.2)

## 2018-11-18 LAB — WET PREP, GENITAL
Clue Cells Wet Prep HPF POC: NONE SEEN
Sperm: NONE SEEN
Trich, Wet Prep: NONE SEEN
Yeast Wet Prep HPF POC: NONE SEEN

## 2018-11-18 LAB — HCG, QUANTITATIVE, PREGNANCY: hCG, Beta Chain, Quant, S: 109270 m[IU]/mL — ABNORMAL HIGH (ref <5)

## 2018-11-18 MED ORDER — FAMOTIDINE 20 MG PO TABS: 20.0000 mg | ORAL_TABLET | Freq: Two times a day (BID) | ORAL | 0 refills | Status: DC

## 2018-11-18 MED ORDER — METOCLOPRAMIDE HCL 10 MG PO TABS: 10.0000 mg | ORAL_TABLET | Freq: Four times a day (QID) | ORAL | 0 refills | Status: DC

## 2018-11-18 MED ORDER — PROMETHAZINE HCL 12.5 MG PO TABS: 12.5000 mg | ORAL_TABLET | Freq: Four times a day (QID) | ORAL | 1 refills | Status: DC | PRN

## 2018-11-18 NOTE — MAU Provider Note (Signed)
History     CSN: [id number]  Arrival date and time: 11/18/18 0037   First Provider Initiated Contact with Patient 11/18/18 1858      Chief Complaint  Patient presents with  . Abdominal Pain  . Vaginal Pain   HPI  Ms.  Haley Potts is a 23 y.o. year old G73P1011 female at [redacted]w[redacted]d weeks gestation who presents to MAU reporting sharp lower abdominal and vaginal pain x 2 days. She denies any VB or abnormal vaginal d/c. She denies no recent SI. No complaints of N/V & SOB. She has not taken anything for the pain. She has an appointment scheduled for NOB history in [facility] on 12/07/2018.  Past Medical History:  Diagnosis Date  . Depression     Past Surgical History:  Procedure Laterality Date  . WISDOM TOOTH EXTRACTION      History reviewed. No pertinent family history.  Social History   Tobacco Use  . Smoking status: Never Smoker  . Smokeless tobacco: Never Used  Substance Use Topics  . Alcohol use: No    Alcohol/week: 0.0 standard drinks  . Drug use: No    Allergies: No Known Allergies  Medications Prior to Admission  Medication Sig Dispense Refill Last Dose  . promethazine (PHENERGAN) 12.5 MG tablet Take 1 tablet (12.5 mg total) by mouth every 6 (six) hours as needed for nausea or vomiting. 30 tablet 1 Past Week at Unknown time    Review of Systems  Constitutional: Negative.   HENT: Negative.   Eyes: Negative.   Respiratory: Negative.   Cardiovascular: Negative.   Gastrointestinal: Negative.   Endocrine: Negative.   Genitourinary: Positive for pelvic pain (sharp x 2 days) and vaginal pain (sharp x 2 days).  Musculoskeletal: Negative.   Skin: Negative.   Allergic/Immunologic: Negative.   Neurological: Negative.   Hematological: Negative.   Psychiatric/Behavioral: Negative.    Physical Exam   Blood pressure 108/65, pulse 80, temperature 98.8 F (37.1 C), temperature source Oral, resp. rate 16, height 5\' 4"  (1.626 m), weight 64.8 kg, last menstrual period  09/20/2018, SpO2 99 %.  Physical Exam  Nursing note and vitals reviewed. Constitutional: She is oriented to person, place, and time. She appears well-developed and well-nourished.  HENT:  Head: Normocephalic and atraumatic.  Eyes: Pupils are equal, round, and reactive to light.  Neck: Normal range of motion.  Cardiovascular: Normal rate.  Respiratory: Effort normal.  GI: Soft.  Genitourinary:    Genitourinary Comments: Uterus: mildly tender, SE: cervix is smooth, pink, no lesions, moderate amt of pinkish-tan vaginal d/c -- WP, GC/CT done, closed/long/firm, no CMT or friability, moderate RT adnexal tenderness, no LT adnexal tenderness or masses    Musculoskeletal: Normal range of motion.  Neurological: She is alert and oriented to person, place, and time. She has normal reflexes.  Skin: Skin is warm and dry.  Psychiatric: She has a normal mood and affect. Her behavior is normal. Judgment and thought content normal.    MAU Course  Procedures  MDM [facility] UPT CBC ABO/Rh -- not drawn; known A Positive HCG Wet Prep GC/CT -- pending HIV -- pending OB < 14 wks [place] with TV  Results for orders placed or performed during the hospital encounter of 11/18/18 (from the past 24 hour(s))  CBC     Status: None   Collection Time: 11/18/18  6:40 PM  Result Value Ref Range   WBC 8.5 4.0 - 10.5 K/uL   RBC 4.64 3.87 - 5.11 MIL/uL   Hemoglobin  13.7 12.0 - 15.0 g/dL   HCT 09.839.8 11.936.0 - 14.746.0 %   MCV 85.8 80.0 - 100.0 fL   MCH 29.5 26.0 - 34.0 pg   MCHC 34.4 30.0 - 36.0 g/dL   RDW 82.912.0 56.211.5 - 13.015.5 %   Platelets 294 150 - 400 K/uL   nRBC 0.0 0.0 - 0.2 %  Urinalysis, Routine w reflex microscopic     Status: Abnormal   Collection Time: 11/18/18  6:52 PM  Result Value Ref Range   Color, Urine YELLOW YELLOW   APPearance HAZY (A) CLEAR   Specific Gravity, Urine 1.026 1.005 - 1.030   pH 5.0 5.0 - 8.0   Glucose, UA NEGATIVE NEGATIVE mg/dL   Hgb urine dipstick NEGATIVE NEGATIVE   Bilirubin Urine  NEGATIVE NEGATIVE   Ketones, ur 20 (A) NEGATIVE mg/dL   Protein, ur NEGATIVE NEGATIVE mg/dL   Nitrite NEGATIVE NEGATIVE   Leukocytes,Ua NEGATIVE NEGATIVE  Wet prep, genital     Status: Abnormal   Collection Time: 11/18/18  7:07 PM  Result Value Ref Range   Yeast Wet Prep HPF POC NONE SEEN NONE SEEN   Trich, Wet Prep NONE SEEN NONE SEEN   Clue Cells Wet Prep HPF POC NONE SEEN NONE SEEN   WBC, Wet Prep HPF POC MODERATE (A) NONE SEEN   Sperm NONE SEEN     [place] Ob Comp Less 14 Wks  Result Date: 11/18/2018 CLINICAL DATA:  23 year old with unknown LMP, possibly around 10/14/2018 (5 weeks 0 days), presenting with LOWER abdominal and pelvic pain. Quantitative beta hCG pending. EXAM: OBSTETRIC <14 WK ULTRASOUND TECHNIQUE: Transabdominal ultrasound was performed for evaluation of the gestation as well as the maternal uterus and adnexal regions. COMPARISON:  None this gestation. FINDINGS: Intrauterine gestational sac: Single. Yolk sac:  Present. Embryo:  Present. Cardiac Activity: Present. Heart Rate: 182 bpm CRL:   24 mm   9 w 0 d                  US EDC: 06/23/2019 Subchorionic hemorrhage:  None visualized. Maternal uterus/adnexae: Normal appearing BILATERAL ovaries, the RIGHT measuring approximately 3.6 x 1.7 x 1.8 cm and the LEFT measuring approximately 2.6 x 1.8 x 1.9 cm, both containing small follicular cysts. No dominant cyst or solid mass. No free pelvic fluid. Prominent parametrial vessels. IMPRESSION: Single live intrauterine embryo with estimated gestational age of [redacted] weeks 0 days by crown-rump length, yielding an ultrasound EDC of 06/23/2019. Electronically Signed   By: Hulan Saashomas  Lawrence M.D.   On: 11/18/2018 19:40   Assessment and Plan  Intrauterine pregnancy  - Information provided on pregnancy - Rx'd Reglan 10 mg every 6 hrs & Pepcid 20 mg BID - Advised to only take Phenergan 12.5 mg at bedtime  Abdominal pain during pregnancy in first trimester  - Advised to take Tylenol 1000 mg every 6  hrs prn pain  - Discharge patient - Keep scheduled appt with [facility] on 12/07/18 - Patient verbalized an understanding of the plan of care and agrees.   [name], MSN, CNM 11/18/2018, 6:58 PM

## 2018-11-18 NOTE — MAU Note (Signed)
Haley Potts is a 23 y.o. at [redacted]w[redacted]d here in MAU reporting: for the past couple of days has been having sharp abdominal and vaginal pain. No vaginal bleeding or discharge.   Onset of complaint: ongoing  Pain score: 6/10 Vitals:   11/18/18 1838  BP: 108/65  Pulse: 80  Resp: 16  Temp: 98.8 F (37.1 C)  SpO2: 99%      Lab orders placed from triage: UA

## 2018-11-19 LAB — HIV ANTIBODY (ROUTINE TESTING W REFLEX): HIV Screen 4th Generation wRfx: NONREACTIVE

## 2018-11-19 LAB — GC/CHLAMYDIA PROBE AMP (~~LOC~~) NOT AT ARMC
Chlamydia: NEGATIVE
Neisseria Gonorrhea: NEGATIVE

## 2018-12-07 ENCOUNTER — Other Ambulatory Visit: Payer: Self-pay

## 2018-12-07 ENCOUNTER — Telehealth (INDEPENDENT_AMBULATORY_CARE_PROVIDER_SITE_OTHER): Payer: Medicaid Other | Admitting: *Deleted

## 2018-12-07 ENCOUNTER — Encounter: Payer: Self-pay | Admitting: *Deleted

## 2018-12-07 DIAGNOSIS — Z3490 Encounter for supervision of normal pregnancy, unspecified, unspecified trimester: Secondary | ICD-10-CM

## 2018-12-07 DIAGNOSIS — Z3491 Encounter for supervision of normal pregnancy, unspecified, first trimester: Secondary | ICD-10-CM

## 2018-12-07 MED ORDER — AMBULATORY NON FORMULARY MEDICATION: 1.0000 | 0 refills | Status: DC

## 2018-12-07 NOTE — Progress Notes (Signed)
Chart reviewed for nurse visit. Agree with plan of care.   Judeth Horn, NP 12/07/2018 11:28 AM

## 2018-12-07 NOTE — Progress Notes (Signed)
I connected with  Corey Harold on 12/07/18 at 10:15 AM EDT by telephone and verified that I am speaking with the correct person using two identifiers.   I discussed the limitations, risks, security and privacy concerns of performing an evaluation and management service by telephone and the availability of in person appointments. I also discussed with the patient that there may be a patient responsible charge related to this service. The patient expressed understanding and agreed to proceed.  New Ob intake interview completed. Pt reports still having some spontaneous vomiting however not as frequent. She denied nausea. She has tried Reglan and Promethazine and these medications do not prevent the problem. Pt was advised that her prenatal care will consist of a combination of virtual as well as face to face visits in office. She agreed to check BP weekly @ home and record results via [facility]. She will complete registration today. Pt voiced understanding of all information and instructions given.   Day, Drucilla Schmidt, RN 12/07/2018  11:12 AM

## 2018-12-10 ENCOUNTER — Telehealth: Payer: Self-pay | Admitting: Student

## 2018-12-10 NOTE — Telephone Encounter (Signed)
Left a VM for patient to call us tomorrow before her appointment to go over some detailed information about her appointment.

## 2018-12-11 ENCOUNTER — Ambulatory Visit (INDEPENDENT_AMBULATORY_CARE_PROVIDER_SITE_OTHER): Payer: Medicaid Other | Admitting: Student

## 2018-12-11 ENCOUNTER — Encounter: Payer: Self-pay | Admitting: *Deleted

## 2018-12-11 ENCOUNTER — Other Ambulatory Visit: Payer: Self-pay

## 2018-12-11 ENCOUNTER — Other Ambulatory Visit (HOSPITAL_COMMUNITY)
Admission: RE | Admit: 2018-12-11 | Discharge: 2018-12-11 | Disposition: A | Discharge status: Home / Self Care | Payer: Medicaid Other | Source: Ambulatory Visit | Attending: Student | Admitting: Student

## 2018-12-11 VITALS — BP 107/68 | HR 81 | Wt 134.3 lb

## 2018-12-11 DIAGNOSIS — Z3490 Encounter for supervision of normal pregnancy, unspecified, unspecified trimester: Secondary | ICD-10-CM | POA: Insufficient documentation

## 2018-12-11 DIAGNOSIS — Z3481 Encounter for supervision of other normal pregnancy, first trimester: Secondary | ICD-10-CM | POA: Diagnosis not present

## 2018-12-11 DIAGNOSIS — Z3491 Encounter for supervision of normal pregnancy, unspecified, first trimester: Secondary | ICD-10-CM

## 2018-12-11 DIAGNOSIS — Z3A11 11 weeks gestation of pregnancy: Secondary | ICD-10-CM

## 2018-12-11 NOTE — Progress Notes (Signed)
Subjective:   Haley Potts is a 23 y.o. G3P1011 at 59w5dby LMP being seen today for her first obstetrical visit.  Her obstetrical history is significant for none. Patient does intend to breast feed. Pregnancy history fully reviewed. She has good support at home. The FOB is involved.   Patient reports no complaints.  HISTORY: OB History  Gravida Para Term Preterm AB Living  _0 0 1 1  SAB TAB Ectopic Multiple Live Births  1 0 0 0 1    # Outcome Date GA Lbr Len/2nd Weight Sex Delivery Anes PTL Lv  3 Current           2 SAB 07/01/18 916w0d       1 Term 01/24/17 4148w0d:20 / 00:23 7 lb 11.8 oz (3.51 kg) F Vag-Spont EPI  LIV     Name: Garant,GIRL Sabrinna     Apgar1: 7  Apgar5: 8   Past Medical History:  Diagnosis Date  . Depression    Post partum   * lasted 4 months   Past Surgical History:  Procedure Laterality Date  . WISDOM TOOTH EXTRACTION     No family history on file. Social History   Tobacco Use  . Smoking status: Never Smoker  . Smokeless tobacco: Never Used  Substance Use Topics  . Alcohol use: No    Alcohol/week: 0.0 standard drinks  . Drug use: No   No Known Allergies Current Outpatient Medications on File Prior to Visit  Medication Sig Dispense Refill  . famotidine (PEPCID) 20 MG tablet Take 1 tablet (20 mg total) by mouth 2 (two) times daily. 30 tablet 0  . promethazine (PHENERGAN) 12.5 MG tablet Take 1 tablet (12.5 mg total) by mouth every 6 (six) hours as needed for nausea or vomiting. Take at bedtime 30 tablet 1  . AMBULATORY NON FORMULARY MEDICATION 1 Device by Other route once a week. Blood pressure cuff/Monitored regularly at home.  ICD 10: Z34.90 1 kit 0  . metoCLOPramide (REGLAN) 10 MG tablet Take 1 tablet (10 mg total) by mouth every 6 (six) hours. (Patient not taking: Reported on 12/07/2018) 30 tablet 0   No current facility-administered medications on file prior to visit.       Exam   Vitals:   12/11/18 1355  BP: 107/68  Pulse:  81  Weight: 134 lb 4.8 oz (60.9 kg)   Fetal Heart Rate (bpm): 154  Uterus:   enlarged 10-12 wks  Pelvic Exam: Perineum: no hemorrhoids, normal perineum   Vulva: normal external genitalia, no lesions   Vagina:  normal mucosa, normal discharge   Cervix: no lesions and normal, pap smear done.    Adnexa: normal adnexa and no mass, fullness, tenderness   Bony Pelvis: average  System: General: well-developed, well-nourished female in no acute distress   Breast:  normal appearance, no masses or tenderness   Skin: normal coloration and turgor, no rashes   Neurologic: oriented, normal, negative, normal mood   Extremities: normal strength, tone, and muscle mass, ROM of all joints is normal   HEENT PERRLA, extraocular movement intact and sclera clear, anicteric   Mouth/Teeth mucous membranes moist, pharynx normal without lesions and dental hygiene good   Neck supple and no masses   Cardiovascular: regular rate and rhythm   Respiratory:  no respiratory distress, normal breath sounds   Abdomen: soft, non-tender; bowel sounds normal; no masses,  no organomegaly     Assessment:  Pregnancy: G3P1011 Patient Active Problem List   Diagnosis Date Noted  . Supervision of low-risk pregnancy 12/07/2018  . Intrauterine pregnancy 11/18/2018  . Abdominal pain during pregnancy in first trimester 11/18/2018     Plan:  1. Encounter for supervision of low-risk pregnancy, antepartum  - Culture, OB Urine - [place] MFM OB DETAIL +14 WK; Future - Genetic Screening - Obstetric Panel, Including HIV - Cytology - PAP( )   Initial labs drawn. Continue prenatal vitamins. Genetic Screening discussed, NIPS: ordered. Ultrasound discussed; fetal anatomic survey: ordered. Problem list reviewed and updated. The nature of [facility] with multiple MDs and other Advanced Practice Providers was explained to patient; also emphasized that residents, students are part of our  team. Routine obstetric precautions reviewed. Return in about 4 weeks (around 01/08/2019) for LOB virtual visit.   Jorje Guild 2:52 PM 12/11/18

## 2018-12-11 NOTE — Patient Instructions (Signed)
Childbirth Education Options: [facility] Department Classes:  Childbirth education classes can help you get ready for a positive parenting experience. You can also meet other expectant parents and get free stuff for your baby. Each class runs for five weeks on the same night and costs $45 for the mother-to-be and her support person. Medicaid covers the cost if you are eligible. Call [phone] to register. [facility] Childbirth Education:  [phone] or [phone] or [email or web address]  Baby & Me Class: Discuss newborn & infant parenting and family adjustment issues with other new mothers in a relaxed environment. Each week brings a new speaker or baby-centered activity. We encourage new mothers to join [place] every Thursday at 11:00am. Babies birth until crawling. No registration or fee. Daddy [employer]: This course offers Dads-to-be the tools and knowledge needed to feel confident on their journey to becoming new fathers. Experienced dads, who have been trained as coaches, teach dads-to-be how to hold, comfort, diaper, swaddle and play with their infant while being able to support the new mom as well. A class for men taught by men. $25/dad Big Brother/Big Sister: Let your children share in the joy of a new brother or sister in this special class designed just for them. Class includes discussion about how families care for babies: swaddling, holding, diapering, safety as well as how they can be helpful in their new role. This class is designed for children ages 41 to 76, but any age is welcome. Please register each child individually. $5/child  Mom Talk: This mom-led group offers support and connection to mothers as they journey through the adjustments and struggles of that sometimes overwhelming first year after the birth of a child. Tuesdays at 10:00am and Thursdays at 6:00pm. Babies welcome. No registration or fee. Breastfeeding Support Group: This group is a mother-to-mother  support circle where moms have the opportunity to share their breastfeeding experiences. A Lactation Consultant is present for questions and concerns. Meets each Tuesday at 11:00am. No fee or registration. Breastfeeding Your Baby: Learn what to expect in the first days of breastfeeding your newborn.  This class will help you feel more confident with the skills needed to begin your breastfeeding experience. Many new mothers are concerned about breastfeeding after leaving the hospital. This class will also address the most common fears and challenges about breastfeeding during the first few weeks, months and beyond. (call for fee) Comfort Techniques and Tour: This 2 hour interactive class will provide you the opportunity to learn & practice hands-on techniques that can help relieve some of the discomfort of labor and encourage your baby to rotate toward the best position for birth. You and your partner will be able to try a variety of labor positions with birth balls and rebozos as well as practice breathing, relaxation, and visualization techniques. A tour of the [facility] is included with this class. $20 per registrant and support person Childbirth Class- Weekend Option: This class is a Weekend version of our Birth & Baby series. It is designed for parents who have a difficult time fitting several weeks of classes into their schedule. It covers the care of your newborn and the basics of labor and childbirth. It also includes a [facility] and lunch. The class is held two consecutive days: beginning on Friday evening from 6:30 - 8:30 p.m. and the next day, Saturday from 9 a.m. - 4 p.m. (call for fee) Doren Custard Class: Interested in a waterbirth?  This  informational class will help you discover whether waterbirth is the right fit for you. Education about waterbirth itself, supplies you would need and how to assemble your support team is what you can  expect from this class. Some obstetrical practices require this class in order to pursue a waterbirth. (Not all obstetrical practices offer waterbirth-check with your healthcare provider.) Register only the expectant mom, but you are encouraged to bring your partner to class! Required if planning waterbirth, no fee. °Infant/Child CPR: Parents, grandparents, babysitters, and friends learn Cardio-Pulmonary Resuscitation skills for infants and children. You will also learn how to treat both conscious and unconscious choking in infants and children. This Family & Friends program does not offer certification. Register each participant individually to ensure that enough mannequins are available. (Call for fee) °Grandparent Love: Expecting a grandbaby? This class is for you! Learn about the latest infant care and safety recommendations and ways to support your own child as he or she transitions into the parenting role. Taught by Registered Nurses who are childbirth instructors, but most importantly...they are grandmothers too! $10/person. °Childbirth Class- Natural Childbirth: This series of 5 weekly classes is for expectant parents who want to learn and practice natural methods of coping with the process of labor and childbirth. Relaxation, breathing, massage, visualization, role of the partner, and helpful positioning are highlighted. Participants learn how to be confident in their body's ability to give birth. This class will empower and help parents make informed decisions about their own care. Includes discussion that will help new parents transition into the immediate postpartum period. Maternity Care Center Tour of [facility] is included. We suggest taking this class between 25-32 weeks, but it's only a recommendation. $75 per registrant and one support person or $30 Medicaid. °Childbirth Class- 3 week Series: This option of 3 weekly classes helps you and your labor partner prepare for childbirth. Newborn  care, labor & birth, cesarean birth, pain management, and comfort techniques are discussed and a Maternity Care Center Tour of [facility] is included. The class meets at the same time, on the same day of the week for 3 consecutive weeks beginning with the starting date you choose. $60 for registrant and one support person.  °Marvelous Multiples: Expecting twins, triplets, or more? This class covers the differences in labor, birth, parenting, and breastfeeding issues that face multiples’ parents. NICU tour is included. Led by a Certified Childbirth Educator who is the mother of twins. No fee. °Caring for Baby: This class is for expectant and adoptive parents who want to learn and practice the most up-to-date newborn care for their babies. Focus is on birth through the first six weeks of life. Topics include feeding, bathing, diapering, crying, umbilical cord care, circumcision care and safe sleep. Parents learn to recognize symptoms of illness and when to call the pediatrician. Register only the mom-to-be and your partner or support person can plan to come with you! $10 per registrant and support person °Childbirth Class- online option: This online class offers you the freedom to complete a Birth and Baby series in the comfort of your own home. The flexibility of this option allows you to review sections at your own pace, at times convenient to you and your support people. It includes additional video information, animations, quizzes, and extended activities. Get organized with helpful eClass tools, checklists, and trackers. Once you register online for the class, you will receive an email within a few days to accept the invitation and begin the class when the time   is right for you. The content will be available to you for 60 days. $60 for 60 days of online access for you and your support people. ° °Local Doulas: °Natural Baby Doulas °[email or web address] °Tel:  [phone] °[email or web address] °[facility] °336-448-4114 °[email or web address] °www.[email or web address] °The Labor Ladies  °(also do waterbirth tub rental) °336-515-0240 °[email or web address] °[email or web address]://[email or web address] °[facility] °336-312-4678 °[email or web address] °[email or web address]://[email or web address] °[employer]  °336-239-2124 °https://[email or web address]-[email or web address]/ °[place] [employer] (PADA) °[email or web address] °http://www.padanc.org/index.htm °[facility]  °[email or web address]://[email or web address]/ °Considering Waterbirth? °Guide for patients at [facility] ° °Why consider waterbirth? ° °• Gentle birth for babies °• Less pain medicine used in labor °• May allow for passive descent/less pushing °• May reduce perineal tears  °• More mobility and instinctive maternal position changes °• Increased maternal relaxation °• Reduced blood pressure in labor ° °Is waterbirth safe? What are the risks of infection, drowning or other complications? ° °• Infection: °o Very low risk (3.7 % for tub vs 4.8% for bed) °o 7 in 8000 waterbirths with documented infection °o Poorly cleaned equipment most common cause °o Slightly lower group B strep transmission rate ° °• Drowning °o Maternal:  °- Very low risk   °- Related to seizures or fainting °o Newborn:  °- Very low risk. No evidence of increased risk of respiratory problems in multiple large studies °- Physiological protection from breathing under water °- Avoid underwater birth if there are any fetal complications °- Once baby’s head is out of the water, keep it out. ° °• Birth complication °o Some reports of cord trauma, but risk decreased by bringing baby to surface gradually °o No evidence of increased risk of shoulder dystocia. Mothers can usually change positions faster in water than in a bed, possibly aiding the maneuvers to free the shoulder. ° ° °You must attend a [facility]birth class at [facility] °· 3rd Wednesday of every month from 7-9pm °· Free °· Register by calling [phone] or online at [email or web address] °· Bring us the certificate from the class to your prenatal appointment ° °Meet with a midwife at 36 weeks to see if you can still plan a waterbirth and to sign the consent.  ° °Purchase or rent the following supplies:  °· Water Birth Pool (Birth Pool in a Box or [place] for instance)  (Tubs start ~$125) °· Single-use disposable tub liner designed for your brand of tub °· New garden hose labeled "lead-free", “suitable for drinking water", °· Electric drain pump to remove water (We recommend 792 gallon per hour or greater pump.)  °· Separate garden hose to remove the dirty water °· Fish net °· Bathing suit top (optional) °· Long-handled mirror (optional) ° °Places to purchase or rent supplies °· Yourwaterbirth.com for tub purchases and supplies °· Waterbirthsolutions.com for tub purchases and supplies °· The [employer] Ladies (www.[email or web address]) $275 for tub rental/set-up & take down/kit  °· [place] [employer] (http://www.padanc.org/MeetUs.htm) Information regarding doulas (labor support) who provide pool rentals °· Our practice has a Birth Pool in a Box tub at the hospital that you may borrow on a first-come-first-served basis. It is your responsibility to to set up, clean and break down the tub. We cannot guarantee the availability of this tub in advance. You are responsible for bringing all accessories listed above. If you do not have all necessary supplies you cannot have a waterbirth.   ° °Things that would prevent you from having a waterbirth: °· Premature, <37wks °· Previous cesarean birth °· Presence of thick meconium-stained fluid °· Multiple gestation (Twins,   triplets, etc.)  Uncontrolled diabetes or gestational diabetes requiring medication  Hypertension requiring medication or diagnosis of pre-eclampsia  Heavy vaginal bleeding  Non-reassuring fetal  heart rate  Active infection (MRSA, etc.). Group B Strep is NOT a contraindication for  waterbirth.  If your labor has to be induced and induction method requires continuous  monitoring of the baby's heart rate  Other risks/issues identified by your obstetrical provider  Please remember that birth is unpredictable. Under certain unforeseeable circumstances your provider may advise against giving birth in the tub. These decisions will be made on a case-by-case basis and with the safety of you and your baby as our highest priority.      How a Baby Grows During Pregnancy  Pregnancy begins when a female's sperm enters a female's egg (fertilization). Fertilization usually happens in one of the tubes (fallopian tubes) that connect the ovaries to the womb (uterus). The fertilized egg moves down the fallopian tube to the uterus. Once it reaches the uterus, it implants into the lining of the uterus and begins to grow. For the first 10 weeks, the fertilized egg is called an embryo. After 10 weeks, it is called a fetus. As the fetus continues to grow, it receives oxygen and nutrients through tissue (placenta) that grows to support the developing baby. The placenta is the life support system for the baby. It provides oxygen and nutrition and removes waste. Learning as much as you can about your pregnancy and how your baby is developing can help you enjoy the experience. It can also make you aware of when there might be a problem and when to ask questions. How long does a typical pregnancy last? A pregnancy usually lasts 280 days, or about 40 weeks. Pregnancy is divided into three periods of growth, also called trimesters:  First trimester: 0-12 weeks.  Second trimester: 13-27 weeks.  Third trimester: 28-40 weeks. The day when your baby is ready to be born (full term) is your estimated date of delivery. How does my baby develop month by month? First month  The fertilized egg attaches to the inside of  the uterus.  Some cells will form the placenta. Others will form the fetus.  The arms, legs, brain, spinal cord, lungs, and heart begin to develop.  At the end of the first month, the heart begins to beat. Second month  The bones, inner ear, eyelids, hands, and feet form.  The genitals develop.  By the end of 8 weeks, all major organs are developing. Third month  All of the internal organs are forming.  Teeth develop below the gums.  Bones and muscles begin to grow. The spine can flex.  The skin is transparent.  Fingernails and toenails begin to form.  Arms and legs continue to grow longer, and hands and feet develop.  The fetus is about 3 inches (7.6 cm) long. Fourth month  The placenta is completely formed.  The external sex organs, neck, outer ear, eyebrows, eyelids, and fingernails are formed.  The fetus can hear, swallow, and move its arms and legs.  The kidneys begin to produce urine.  The skin is covered with a white, waxy coating (vernix) and very fine hair (lanugo). Fifth month  The fetus moves around more and can be felt for the first time (quickening).  The fetus starts to sleep and wake up and may begin to suck its finger.  The nails grow to the end of the fingers.  The organ in the digestive system that  makes bile (gallbladder) functions and helps to digest nutrients.  If your baby is a girl, eggs are present in her ovaries. If your baby is a boy, testicles start to move down into his scrotum. Sixth month  The lungs are formed.  The eyes open. The brain continues to develop.  Your baby has fingerprints and toe prints. Your baby's hair grows thicker.  At the end of the second trimester, the fetus is about 9 inches (22.9 cm) long. Seventh month  The fetus kicks and stretches.  The eyes are developed enough to sense changes in light.  The hands can make a grasping motion.  The fetus responds to sound. Eighth month  All organs and body  systems are fully developed and functioning.  Bones harden, and taste buds develop. The fetus may hiccup.  Certain areas of the brain are still developing. The skull remains soft. Ninth month  The fetus gains about  lb (0.23 kg) each week.  The lungs are fully developed.  Patterns of sleep develop.  The fetus's head typically moves into a head-down position (vertex) in the uterus to prepare for birth.  The fetus weighs 6-9 lb (2.72-4.08 kg) and is 19-20 inches (48.26-50.8 cm) long. What can I do to have a healthy pregnancy and help my baby develop? General instructions  Take prenatal vitamins as directed by your health care provider. These include vitamins such as folic acid, iron, calcium, and vitamin D. They are important for healthy development.  Take medicines only as directed by your health care provider. Read labels and ask a pharmacist or your health care provider whether over-the-counter medicines, supplements, and prescription drugs are safe to take during pregnancy.  Keep all follow-up visits as directed by your health care provider. This is important. Follow-up visits include prenatal care and screening tests. How do I know if my baby is developing well? At each prenatal visit, your health care provider will do several different tests to check on your health and keep track of your baby's development. These include:  Fundal height and position. ? Your health care provider will measure your growing belly from your pubic bone to the top of the uterus using a tape measure. ? Your health care provider will also feel your belly to determine your baby's position.  Heartbeat. ? An ultrasound in the first trimester can confirm pregnancy and show a heartbeat, depending on how far along you are. ? Your health care provider will check your baby's heart rate at every prenatal visit.  Second trimester ultrasound. ? This ultrasound checks your baby's development. It also may show  your baby's gender. What should I do if I have concerns about my baby's development? Always talk with your health care provider about any concerns that you may have about your pregnancy and your baby. Summary  A pregnancy usually lasts 280 days, or about 40 weeks. Pregnancy is divided into three periods of growth, also called trimesters.  Your health care provider will monitor your baby's growth and development throughout your pregnancy.  Follow your health care provider's recommendations about taking prenatal vitamins and medicines during your pregnancy.  Talk with your health care provider if you have any concerns about your pregnancy or your developing baby. This information is not intended to replace advice given to you by your health care provider. Make sure you discuss any questions you have with your health care provider. Document Released: 12/07/2007 Document Revised: 05/03/2017 Document Reviewed: 05/03/2017 Elsevier Interactive Patient Education  2019  [employer].

## 2018-12-12 LAB — OBSTETRIC PANEL, INCLUDING HIV
Antibody Screen: NEGATIVE
Basophils Absolute: 0 10*3/uL (ref 0.0–0.2)
Basos: 1 %
EOS (ABSOLUTE): 0.1 10*3/uL (ref 0.0–0.4)
Eos: 1 %
HIV Screen 4th Generation wRfx: NONREACTIVE
Hematocrit: 36.9 % (ref 34.0–46.6)
Hemoglobin: 12.7 g/dL (ref 11.1–15.9)
Hepatitis B Surface Ag: NEGATIVE
Immature Grans (Abs): 0 10*3/uL (ref 0.0–0.1)
Immature Granulocytes: 0 %
Lymphocytes Absolute: 1.9 10*3/uL (ref 0.7–3.1)
Lymphs: 28 %
MCH: 29.3 pg (ref 26.6–33.0)
MCHC: 34.4 g/dL (ref 31.5–35.7)
MCV: 85 fL (ref 79–97)
Monocytes Absolute: 0.5 10*3/uL (ref 0.1–0.9)
Monocytes: 8 %
Neutrophils Absolute: 4.2 10*3/uL (ref 1.4–7.0)
Neutrophils: 62 %
Platelets: 263 10*3/uL (ref 150–450)
RBC: 4.34 x10E6/uL (ref 3.77–5.28)
RDW: 12.7 % (ref 11.7–15.4)
RPR Ser Ql: NONREACTIVE
Rh Factor: POSITIVE
Rubella Antibodies, IGG: 14.2 index (ref >0.99)
WBC: 6.7 10*3/uL (ref 3.4–10.8)

## 2018-12-13 LAB — CYTOLOGY - PAP
Chlamydia: NEGATIVE
Diagnosis: NEGATIVE
Neisseria Gonorrhea: NEGATIVE

## 2018-12-13 LAB — URINE CULTURE, OB REFLEX

## 2018-12-13 LAB — CULTURE, OB URINE

## 2018-12-17 DIAGNOSIS — Z3490 Encounter for supervision of normal pregnancy, unspecified, unspecified trimester: Secondary | ICD-10-CM | POA: Diagnosis not present

## 2018-12-25 ENCOUNTER — Encounter: Payer: Self-pay | Admitting: *Deleted

## 2019-01-03 ENCOUNTER — Telehealth: Payer: Self-pay | Admitting: Family Medicine

## 2019-01-03 ENCOUNTER — Telehealth: Payer: Self-pay | Admitting: *Deleted

## 2019-01-03 DIAGNOSIS — Z3490 Encounter for supervision of normal pregnancy, unspecified, unspecified trimester: Secondary | ICD-10-CM

## 2019-01-03 NOTE — Telephone Encounter (Signed)
-----   Message from Donnamae Jude, MD sent at 12/28/2018  8:42 AM EDT ----- Increased risk of SMA--call [facility] for genetic counseling and possible FOB testing

## 2019-01-03 NOTE — Telephone Encounter (Signed)
Called and left patient a detailed message about upcoming mychart Virtual Visit.

## 2019-01-03 NOTE — Telephone Encounter (Signed)
Genetic counseling referral scheduled for 02/06/19 @ 0900.    Called pt regarding results [place] screen showing she has an increased risk of SMA as well as to ask if FOB could be tested and inform her of genetic counseling appointment at [place]. Pt did not pick up.  Left voicemail advising pt that she was being contacted regarding results and requesting she contacted the clinic.

## 2019-01-07 ENCOUNTER — Telehealth: Payer: Self-pay

## 2019-01-07 NOTE — Telephone Encounter (Signed)
Attempted to call patient about her appointment on 7/7 @ 2:35. No answer, left a voicemail instructing patient that the visit is a [place] visit. Patient instructed to download the mychart app if she does not have it yet. Advised to give the office a call if needing assistance or needing to rescheduled.

## 2019-01-07 NOTE — Telephone Encounter (Signed)
Called pt regarding her Horizon results.  Pt did not pick up.  Left message advising pt that she was being contacted regarding results and requesting she contact the clinic. Per chart review, pt has appointment 01/08/19.  Will advise her of results at that appointment.

## 2019-01-08 ENCOUNTER — Encounter: Payer: Self-pay | Admitting: Family Medicine

## 2019-01-08 ENCOUNTER — Telehealth (INDEPENDENT_AMBULATORY_CARE_PROVIDER_SITE_OTHER): Payer: Medicaid Other

## 2019-01-08 ENCOUNTER — Encounter: Payer: Self-pay | Admitting: *Deleted

## 2019-01-08 ENCOUNTER — Other Ambulatory Visit: Payer: Self-pay

## 2019-01-08 DIAGNOSIS — Z3A15 15 weeks gestation of pregnancy: Secondary | ICD-10-CM

## 2019-01-08 DIAGNOSIS — Z148 Genetic carrier of other disease: Secondary | ICD-10-CM | POA: Insufficient documentation

## 2019-01-08 DIAGNOSIS — Z3490 Encounter for supervision of normal pregnancy, unspecified, unspecified trimester: Secondary | ICD-10-CM

## 2019-01-08 DIAGNOSIS — Z3492 Encounter for supervision of normal pregnancy, unspecified, second trimester: Secondary | ICD-10-CM

## 2019-01-08 NOTE — Progress Notes (Signed)
   TELEHEALTH VIRTUAL OBSTETRICS VISIT ENCOUNTER NOTE  I connected with Haley Potts on 01/08/19 at  2:35 PM EDT by MyChart Virtual Visit at home and verified that I am speaking with the correct person using two identifiers.   I discussed the limitations, risks, security and privacy concerns of performing an evaluation and management service by telephone and the availability of in person appointments. I also discussed with the patient that there may be a patient responsible charge related to this service. The patient expressed understanding and agreed to proceed.  Subjective:  Haley Potts is a 23 y.o. G3P1011 at [redacted]w[redacted]d being followed for ongoing prenatal care.  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  Patient reports no complaints. Reports fetal movement. Denies any contractions, bleeding or leaking of fluid.   The following portions of the patient's history were reviewed and updated as appropriate: allergies, current medications, past family history, past medical history, past social history, past surgical history and problem list.   Objective:   General:  Alert, oriented and cooperative.   Mental Status: Normal mood and affect perceived. Normal judgment and thought content.  Rest of physical exam deferred due to type of encounter  Assessment and Plan:  Pregnancy: G3P1011 at [redacted]w[redacted]d 1. Encounter for supervision of low-risk pregnancy, antepartum -BP 118/68 -Desires waterbirth. Discussed with patient that at this time, due to COVID-19, waterbirth is not being offered. Will continue to evaluate during pregnancy and hopeful that it will be offered by her delivery. Discussed waterbirth at length with patient.  -Discussed pregnancy support belt as pregnancy progresses. -Reviewed  results of labs from [place] with patient. Anatomy u/s scheduled 8/4 -Abnormal SMA results reviewed with patient. Genetic counseling scheduled 8/5  Preterm labor symptoms and general obstetric precautions including but not limited to vaginal bleeding, contractions, leaking of fluid and fetal movement were reviewed in detail with the patient.  I discussed the assessment and treatment plan with the patient. The patient was provided an opportunity to ask questions and all were answered. The patient agreed with the plan and demonstrated an understanding of the instructions. The patient was advised to call back or seek an in-person office evaluation/go to [facility] at [facility] for any urgent or concerning symptoms. Please refer to After Visit Summary for other counseling recommendations.   I provided 20 minutes of non-face-to-face time during this encounter.  Return in about 5 weeks (around 02/12/2019) for Return OB visit.  Future Appointments  Date Time Provider [place]  02/05/2019  9:00 AM [place] M[facility]-US  02/05/2019  9:00 AM WH-MFC [place] 3 WH-MFCUS MFC-US  02/06/2019  9:00 AM [place] GENETIC COUNSELING RM [place] MFC-US    Wende Mott, [place] for [employer], [place]

## 2019-01-08 NOTE — Progress Notes (Signed)
I connected with  Haley Potts on 01/08/19 at  2:35 PM EDT by telephone and verified that I am speaking with the correct person using two identifiers.   I discussed the limitations, risks, security and privacy concerns of performing an evaluation and management service by telephone and the availability of in person appointments. I also discussed with the patient that there may be a patient responsible charge related to this service. The patient expressed understanding and agreed to proceed.  [place], [place] 01/08/2019  2:27 PM

## 2019-02-01 ENCOUNTER — Inpatient Hospital Stay (HOSPITAL_COMMUNITY)
Admission: AD | Admit: 2019-02-01 | Discharge: 2019-02-01 | Disposition: A | Discharge status: Home / Self Care | Payer: Medicaid Other | Attending: Family Medicine | Admitting: Family Medicine

## 2019-02-01 ENCOUNTER — Encounter (HOSPITAL_COMMUNITY): Payer: Self-pay | Admitting: *Deleted

## 2019-02-01 ENCOUNTER — Other Ambulatory Visit: Payer: Self-pay

## 2019-02-01 DIAGNOSIS — Z3490 Encounter for supervision of normal pregnancy, unspecified, unspecified trimester: Secondary | ICD-10-CM

## 2019-02-01 DIAGNOSIS — B3731 Acute candidiasis of vulva and vagina: Secondary | ICD-10-CM

## 2019-02-01 DIAGNOSIS — O98812 Other maternal infectious and parasitic diseases complicating pregnancy, second trimester: Secondary | ICD-10-CM | POA: Insufficient documentation

## 2019-02-01 DIAGNOSIS — O26891 Other specified pregnancy related conditions, first trimester: Secondary | ICD-10-CM | POA: Diagnosis not present

## 2019-02-01 DIAGNOSIS — O26892 Other specified pregnancy related conditions, second trimester: Secondary | ICD-10-CM | POA: Diagnosis not present

## 2019-02-01 DIAGNOSIS — B373 Candidiasis of vulva and vagina: Secondary | ICD-10-CM | POA: Insufficient documentation

## 2019-02-01 DIAGNOSIS — R102 Pelvic and perineal pain: Secondary | ICD-10-CM | POA: Diagnosis not present

## 2019-02-01 DIAGNOSIS — R1030 Lower abdominal pain, unspecified: Secondary | ICD-10-CM | POA: Insufficient documentation

## 2019-02-01 DIAGNOSIS — N949 Unspecified condition associated with female genital organs and menstrual cycle: Secondary | ICD-10-CM | POA: Diagnosis not present

## 2019-02-01 DIAGNOSIS — R109 Unspecified abdominal pain: Secondary | ICD-10-CM | POA: Diagnosis not present

## 2019-02-01 DIAGNOSIS — Z3A19 19 weeks gestation of pregnancy: Secondary | ICD-10-CM | POA: Diagnosis not present

## 2019-02-01 LAB — WET PREP, GENITAL
Clue Cells Wet Prep HPF POC: NONE SEEN
Sperm: NONE SEEN
Trich, Wet Prep: NONE SEEN
Yeast Wet Prep HPF POC: NONE SEEN

## 2019-02-01 LAB — URINALYSIS, ROUTINE W REFLEX MICROSCOPIC
Bilirubin Urine: NEGATIVE
Glucose, UA: 50 mg/dL — AB
Hgb urine dipstick: NEGATIVE
Ketones, ur: NEGATIVE mg/dL
Leukocytes,Ua: NEGATIVE
Nitrite: NEGATIVE
Protein, ur: NEGATIVE mg/dL
Specific Gravity, Urine: 1.011 (ref 1.005–1.030)
pH: 6 (ref 5.0–8.0)

## 2019-02-01 MED ORDER — TERCONAZOLE 0.4 % VA CREA: 1.0000 | TOPICAL_CREAM | Freq: Every day | VAGINAL | 0 refills | Status: AC

## 2019-02-01 NOTE — MAU Provider Note (Signed)
History     CSN: [id number]  Arrival date and time: 02/01/19 5364   First Provider Initiated Contact with Patient 02/01/19 2039      Chief Complaint  Patient presents with  . Pelvic Pain   Davey LUREE PALLA is a 23 y.o. G3P1011 at 43w1dwho presents today with lower abdominal pain that started about 2 hours PTA. She states that initally it was intermittent, but now the pain is constant. She denies any VB or LOF. She has started to feel some occasional fetal movement.   Pelvic Pain The patient's primary symptoms include pelvic pain. The patient's pertinent negatives include no vaginal discharge. This is a new problem. The current episode started today. The problem occurs constantly. The problem has been gradually worsening. The problem affects both sides. She is pregnant. Pertinent negatives include no chills, constipation (last BM 02/01/2019), diarrhea, dysuria, fever, frequency, nausea or vomiting. The vaginal discharge was normal. There has been no bleeding. The symptoms are aggravated by activity. She has tried nothing for the symptoms.    OB History    Gravida  3   Para  1   Term  1   Preterm  0   AB  1   Living  1     SAB  1   TAB  0   Ectopic  0   Multiple  0   Live Births  1           Past Medical History:  Diagnosis Date  . Depression    Post partum   * lasted 4 months    Past Surgical History:  Procedure Laterality Date  . WISDOM TOOTH EXTRACTION      History reviewed. No pertinent family history.  Social History   Tobacco Use  . Smoking status: Never Smoker  . Smokeless tobacco: Never Used  Substance Use Topics  . Alcohol use: No    Alcohol/week: 0.0 standard drinks  . Drug use: No    Allergies: No Known Allergies  Medications Prior to Admission  Medication Sig Dispense Refill Last Dose  . famotidine (PEPCID) 20 MG tablet Take 1 tablet (20 mg total) by mouth 2 (two) times daily. 30 tablet 0 Past Month at Unknown time  . Prenatal  Vit-Fe Fumarate-FA (PRENATAL MULTIVITAMIN) TABS tablet Take 1 tablet by mouth daily at 12 noon.   02/01/2019 at Unknown time  . AMBULATORY NON FORMULARY MEDICATION 1 Device by Other route once a week. Blood pressure cuff/Monitored regularly at home.  ICD 10: Z34.90 1 kit 0   . metoCLOPramide (REGLAN) 10 MG tablet Take 1 tablet (10 mg total) by mouth every 6 (six) hours. (Patient not taking: Reported on 12/07/2018) 30 tablet 0 More than a month at Unknown time  . promethazine (PHENERGAN) 12.5 MG tablet Take 1 tablet (12.5 mg total) by mouth every 6 (six) hours as needed for nausea or vomiting. Take at bedtime 30 tablet 1 More than a month at Unknown time    Review of Systems  Constitutional: Negative for chills and fever.  Gastrointestinal: Negative for constipation (last BM 02/01/2019), diarrhea, nausea and vomiting.  Genitourinary: Positive for pelvic pain. Negative for dysuria, frequency, vaginal bleeding and vaginal discharge.   Physical Exam   Blood pressure 121/60, pulse 97, temperature 98.1 F (36.7 C), resp. rate 16, height '5\' 4"'$  (1.626 m), weight 68.4 kg, last menstrual period 09/20/2018.  Physical Exam  Nursing note and vitals reviewed. Constitutional: She is oriented to person, place, and time. She  appears well-developed and well-nourished. No distress.  HENT:  Head: Normocephalic.  Cardiovascular: Normal rate.  Respiratory: Effort normal.  GI: Soft. There is no abdominal tenderness. There is no rebound.  Genitourinary:    Genitourinary Comments:  External: no lesion Vagina: small amount of thick, white, adherent discharge  Cervix: pink, smooth, no CMT, closed/thick  Uterus: AGA, +FHT with doppler, 140   Neurological: She is alert and oriented to person, place, and time.  Skin: Skin is warm and dry.  Psychiatric: She has a normal mood and affect.   Results for orders placed or performed during the hospital encounter of 02/01/19 (from the past 24 hour(s))  Urinalysis, Routine  w reflex microscopic     Status: Abnormal   Collection Time: 02/01/19  8:26 PM  Result Value Ref Range   Color, Urine YELLOW YELLOW   APPearance CLEAR CLEAR   Specific Gravity, Urine 1.011 1.005 - 1.030   pH 6.0 5.0 - 8.0   Glucose, UA 50 (A) NEGATIVE mg/dL   Hgb urine dipstick NEGATIVE NEGATIVE   Bilirubin Urine NEGATIVE NEGATIVE   Ketones, ur NEGATIVE NEGATIVE mg/dL   Protein, ur NEGATIVE NEGATIVE mg/dL   Nitrite NEGATIVE NEGATIVE   Leukocytes,Ua NEGATIVE NEGATIVE  Wet prep, genital     Status: Abnormal   Collection Time: 02/01/19  8:56 PM   Specimen: Vaginal  Result Value Ref Range   Yeast Wet Prep HPF POC NONE SEEN NONE SEEN   Trich, Wet Prep NONE SEEN NONE SEEN   Clue Cells Wet Prep HPF POC NONE SEEN NONE SEEN   WBC, Wet Prep HPF POC FEW (A) NONE SEEN   Sperm NONE SEEN     MAU Course  Procedures  MDM Will treat with terazol based on clinical signs of yeastr.    Assessment and Plan   1. Yeast infection involving the vagina and surrounding area   2. Intrauterine pregnancy   3. Abdominal pain during pregnancy in first trimester   4. Round ligament pain   5. [redacted] weeks gestation of pregnancy    DC home Comfort measures reviewed  2nd Trimester precautions  PTL precautions  Fetal kick counts RX: terazol 7 as directed #1 Return to MAU as needed FU with OB as planned  [place] for [facility] Follow up.   Specialty: Obstetrics and Gynecology Contact information: [address], [address] [id number] mc [place] [id number] [place] DNP, CNM  02/01/19  9:45 PM

## 2019-02-01 NOTE — Discharge Instructions (Signed)

## 2019-02-01 NOTE — MAU Note (Addendum)
Having pain in lower abd for an hour. Sharp and crampy. Pain is constant. Denies vag bleeding or d/c. Was just sitting when pain started. Normal BM this am

## 2019-02-05 ENCOUNTER — Ambulatory Visit (HOSPITAL_COMMUNITY): Payer: Medicaid Other | Admitting: *Deleted

## 2019-02-05 ENCOUNTER — Ambulatory Visit (HOSPITAL_COMMUNITY)
Admission: RE | Admit: 2019-02-05 | Discharge: 2019-02-05 | Disposition: A | Discharge status: Home / Self Care | Payer: Medicaid Other | Source: Ambulatory Visit | Attending: Obstetrics and Gynecology | Admitting: Obstetrics and Gynecology

## 2019-02-05 ENCOUNTER — Other Ambulatory Visit: Payer: Self-pay

## 2019-02-05 ENCOUNTER — Ambulatory Visit (HOSPITAL_BASED_OUTPATIENT_CLINIC_OR_DEPARTMENT_OTHER): Payer: Medicaid Other | Admitting: Genetic Counselor

## 2019-02-05 ENCOUNTER — Ambulatory Visit (HOSPITAL_COMMUNITY): Payer: Self-pay | Admitting: Genetic Counselor

## 2019-02-05 ENCOUNTER — Encounter (HOSPITAL_COMMUNITY): Payer: Self-pay | Admitting: *Deleted

## 2019-02-05 DIAGNOSIS — O26891 Other specified pregnancy related conditions, first trimester: Secondary | ICD-10-CM | POA: Insufficient documentation

## 2019-02-05 DIAGNOSIS — Z3490 Encounter for supervision of normal pregnancy, unspecified, unspecified trimester: Secondary | ICD-10-CM | POA: Diagnosis present

## 2019-02-05 DIAGNOSIS — Z31430 Encounter of female for testing for genetic disease carrier status for procreative management: Secondary | ICD-10-CM | POA: Insufficient documentation

## 2019-02-05 DIAGNOSIS — O359XX0 Maternal care for (suspected) fetal abnormality and damage, unspecified, not applicable or unspecified: Secondary | ICD-10-CM | POA: Diagnosis not present

## 2019-02-05 DIAGNOSIS — Z148 Genetic carrier of other disease: Secondary | ICD-10-CM | POA: Diagnosis not present

## 2019-02-05 DIAGNOSIS — R109 Unspecified abdominal pain: Secondary | ICD-10-CM | POA: Diagnosis present

## 2019-02-05 DIAGNOSIS — Z3A19 19 weeks gestation of pregnancy: Secondary | ICD-10-CM

## 2019-02-05 LAB — GC/CHLAMYDIA PROBE AMP (~~LOC~~) NOT AT ARMC
Chlamydia: NEGATIVE
Neisseria Gonorrhea: NEGATIVE

## 2019-02-05 NOTE — Progress Notes (Signed)
02/05/2019  Haley Potts 1995/07/26 MRN: [id number] DOV: 02/05/2019  Ms. 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 Ms. Haley Potts came to her appointment alone due to COVID-19 visitor restrictions.   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 Haley Potts is a [id number], 23 y.o. year old female. Her current pregnancy has completed 6582w5d (Estimated Date of Delivery: 06/27/19).  Ms. Haley Potts denied exposure to environmental toxins or chemical agents. She denied the use of alcohol, tobacco or street drugs. She denied significant viral illnesses, fevers, and bleeding during the course of her pregnancy. Her medical and surgical histories were noncontributory.  Family History  A three generation pedigree was drafted and reviewed. Both family histories were reviewed and found to be noncontributory for birth defects, intellectual disability, recurrent pregnancy loss, and known genetic conditions.    The patient's ethnicity is African American. The father of the pregnancy's ethnicity is African American. Ashkenazi Jewish ancestry and consanguinity were denied. Pedigree will be scanned under Media.  Discussion  Ms. Haley Potts was found to have 2 copies of the SMN1 gene on [employer] carrier screening for spinal muscular atrophy (SMA); however, she also has the c.*3+80T>G polymorphism of SMN1 in intron 7 (also known as g.27134T>G). This puts her at increased risk (1 in 7134) to be a silent 2+0 carrier for SMA. SMA is a condition caused by mutations in the SMN1 gene. SMA is characterized by progressive muscle weakness and atrophy due to degeneration and loss of anterior horn cells (lower motor neurons) in the spinal cord and brain stem. There are several different types of SMA (0, I, II, and III), which have differences in severity and age of onset. We reviewed that SMA is inherited in an autosomal  recessive pattern.   We discussed that carrier testing by copy number analysis is recommended for the father of the pregnancy. Based on the carrier frequency for SMA in the African American population, he currently has a 1 in 66 chance of being a carrier of SMA. If he is found to have 2 copies of SMN1, his risk of being a carrier is reduced but not eliminated. If both parents are carriers of SMA, there is a 25% chance of having an affected fetus. Ms. Haley Potts indicated that she is interested in pursuing SMA carrier screening for the father of the pregnancy.  Additionally, we reviewed her normal hemoglobin electrophoresis and cystic fibrosis (CF) carrier screening results. We reviewed that CF and hemoglobinopathies are routinely screened for as part of the [employer] newborn screening panel. Ms. Haley Potts was also informed about the Early Check research study to add SMA to her baby's newborn screening panel.   We reviewed that Ms. Pendergrass had Panorama testing through [facility] that was low risk for fetal aneuploidies. We reviewed that these results showed a less than 1 in 10,000 risk for trisomies 21, 18 and 13, and monosomy X (Turner syndrome). In addition, the risk for triploidy and sex chromosome trisomies (47,XXX and 47,XXY) was also low risk. Ms. Haley Potts elected to have cffDNA analysis for 22q11.2 deletion syndrome, which was also low risk (1 in 9000). We reviewed that this testing identifies > 99% of pregnancies with trisomy 4821, trisomy 513, sex chromosome trisomies (47,XXX, 47,XXY, and 47,XYY), and triploidy. The detection rate for trisomy 18 is 98%. The detection rate for monosomy X is ~94%. The detection rate for 22q11.2 deletion syndrome is 90%.The false positive  rate is <0.1% for all conditions.   A complete ultrasound was performed today prior to our visit. The ultrasound report will be sent under separate cover. An echogenic intracardiac focus (EIF) was identified on ultrasound. This finding is considered to be a  normal variant in the context of low risk NIPS results.There were no other visualized fetal anomalies or markers suggestive of aneuploidy. Ms. Carre understands that screening tests, including ultrasound, cannot rule out all birth defects or genetic syndromes.  Ms. Schexnayder was counseled regarding diagnostic testing via amniocentesis. We discussed that prenatal diagnosis is available to determine the SMN1 copy number of the fetus as well as to identify other chromosome abnormalities. We reviewed the approximate 1 in 628 risk for complications associated with amniocentesis, including spontaneous pregnancy loss. She declined amniocentesis at this time, opting instead to pursue partner carrier screening for SMA.  I provided my business card to Ms. Crusoe and encouraged her partner, Justice Smith-Martin to call to make an appointment to get his blood drawn for SMA carrier screening via SMN1 copy number analysis. Results will take 7-14 days to return after he gets his blood drawn. I will follow the results once the order has been placed. Should Mr. Elayne Guerin not desire carrier screening, I encouraged Ms. Markes to reach out to me for more information about the Early Check research study to add SMA to her baby's newborn screening panel.  I counseled Ms. Deblois regarding the above risks and available options. The approximate face-to-face time with the genetic counselor was 30 minutes.  In summary:  Discussed SMA carrier screening results and options for follow-up testing ? Ms. Riel desires SMA carrier screening for her partner  Reviewed negative CF and hemoglobinopathy carrier screening results  Reviewed low-risk Panorama NIPS  ? Reduction in risk for Down syndrome, Trisomy 13, Trisomy 64, sex chromosome aneuploidies, and 22q11 deletion syndrome  Reviewed results of ultrasound ? EIF identified - considered normal variant  Offered additional testing and screening  Declined amniocentesis  Reviewed  family history concerns   Buelah Manis, MS Genetic Counselor

## 2019-02-06 ENCOUNTER — Ambulatory Visit (HOSPITAL_COMMUNITY): Payer: Medicaid Other

## 2019-02-12 ENCOUNTER — Encounter: Payer: Self-pay | Admitting: Advanced Practice Midwife

## 2019-02-12 ENCOUNTER — Other Ambulatory Visit: Payer: Self-pay

## 2019-02-12 ENCOUNTER — Telehealth (INDEPENDENT_AMBULATORY_CARE_PROVIDER_SITE_OTHER): Payer: Medicaid Other | Admitting: Advanced Practice Midwife

## 2019-02-12 VITALS — BP 124/79 | HR 113

## 2019-02-12 DIAGNOSIS — Z148 Genetic carrier of other disease: Secondary | ICD-10-CM

## 2019-02-12 DIAGNOSIS — O99612 Diseases of the digestive system complicating pregnancy, second trimester: Secondary | ICD-10-CM

## 2019-02-12 DIAGNOSIS — Z3492 Encounter for supervision of normal pregnancy, unspecified, second trimester: Secondary | ICD-10-CM

## 2019-02-12 DIAGNOSIS — K219 Gastro-esophageal reflux disease without esophagitis: Secondary | ICD-10-CM

## 2019-02-12 DIAGNOSIS — Z3A20 20 weeks gestation of pregnancy: Secondary | ICD-10-CM

## 2019-02-12 MED ORDER — PEPCID COMPLETE 10-800-165 MG PO CHEW: 2.0000 | CHEWABLE_TABLET | Freq: Two times a day (BID) | ORAL | 4 refills | Status: DC | PRN

## 2019-02-12 NOTE — Patient Instructions (Signed)
TDaP Vaccine Pregnancy Get the Whooping Cough Vaccine While You Are Pregnant (CDC)  It is important for women to get the whooping cough vaccine in the third trimester of each pregnancy. Vaccines are the best way to prevent this disease. There are 2 different whooping cough vaccines. Both vaccines combine protection against whooping cough, tetanus and diphtheria, but they are for different age groups: Tdap: for everyone 11 years or older, including pregnant women  DTaP: for children 2 months through 6 years of age  You need the whooping cough vaccine during each of your pregnancies The recommended time to get the shot is during your 27th through 36th week of pregnancy, preferably during the earlier part of this time period. The Centers for Disease Control and Prevention (CDC) recommends that pregnant women receive the whooping cough vaccine for adolescents and adults (called Tdap vaccine) during the third trimester of each pregnancy. The recommended time to get the shot is during your 27th through 36th week of pregnancy, preferably during the earlier part of this time period. This replaces the original recommendation that pregnant women get the vaccine only if they had not previously received it. The American College of Obstetricians and Gynecologists and the American College of Nurse-Midwives support this recommendation.  You should get the whooping cough vaccine while pregnant to pass protection to your baby frame support disabled and/or not supported in this browser  Learn why Haley Potts decided to get the whooping cough vaccine in her 3rd trimester of pregnancy and how her baby girl was born with some protection against the disease. Also available on YouTube. After receiving the whooping cough vaccine, your body will create protective antibodies (proteins produced by the body to fight off diseases) and pass some of them to your baby before birth. These antibodies provide your baby some short-term  protection against whooping cough in early life. These antibodies can also protect your baby from some of the more serious complications that come along with whooping cough. Your protective antibodies are at their highest about 2 weeks after getting the vaccine, but it takes time to pass them to your baby. So the preferred time to get the whooping cough vaccine is early in your third trimester. The amount of whooping cough antibodies in your body decreases over time. That is why CDC recommends you get a whooping cough vaccine during each pregnancy. Doing so allows each of your babies to get the greatest number of protective antibodies from you. This means each of your babies will get the best protection possible against this disease.  Getting the whooping cough vaccine while pregnant is better than getting the vaccine after you give birth Whooping cough vaccination during pregnancy is ideal so your baby will have short-term protection as soon as he is born. This early protection is important because your baby will not start getting his whooping cough vaccines until he is 2 months old. These first few months of life are when your baby is at greatest risk for catching whooping cough. This is also when he's at greatest risk for having severe, potentially life-threating complications from the infection. To avoid that gap in protection, it is best to get a whooping cough vaccine during pregnancy. You will then pass protection to your baby before he is born. To continue protecting your baby, he should get whooping cough vaccines starting at 2 months old. You may never have gotten the Tdap vaccine before and did not get it during this pregnancy. If so, you should make sure   to get the vaccine immediately after you give birth, before leaving the hospital or birthing center. It will take about 2 weeks before your body develops protection (antibodies) in response to the vaccine. Once you have protection from the vaccine,  you are less likely to give whooping cough to your newborn while caring for him. But remember, your baby will still be at risk for catching whooping cough from others. A recent study looked to see how effective Tdap was at preventing whooping cough in babies whose mothers got the vaccine while pregnant or in the hospital after giving birth. The study found that getting Tdap between 27 through 36 weeks of pregnancy is 85% more effective at preventing whooping cough in babies younger than 2 months old. Blood tests cannot tell if you need a whooping cough vaccine There are no blood tests that can tell you if you have enough antibodies in your body to protect yourself or your baby against whooping cough. Even if you have been sick with whooping cough in the past or previously received the vaccine, you still should get the vaccine during each pregnancy. Breastfeeding may pass some protective antibodies onto your baby By breastfeeding, you may pass some antibodies you have made in response to the vaccine to your baby. When you get a whooping cough vaccine during your pregnancy, you will have antibodies in your breast milk that you can share with your baby as soon as your milk comes in. However, your baby will not get protective antibodies immediately if you wait to get the whooping cough vaccine until after delivering your baby. This is because it takes about 2 weeks for your body to create antibodies. Learn more about the health benefits of breastfeeding.  

## 2019-02-12 NOTE — Progress Notes (Signed)
I connected with@ on 02/12/19 at  1:15 PM EDT by: MyChart and verified that I am speaking with the correct person using two identifiers.  Patient is located at home and provider is located at [facility]     The purpose of this virtual visit is to provide medical care while limiting exposure to the novel coronavirus. I discussed the limitations, risks, security and privacy concerns of performing an evaluation and management service by [facility] and the availability of in person appointments. I also discussed with the patient that there may be a patient responsible charge related to this service. By engaging in this virtual visit, you consent to the provision of healthcare.  Additionally, you authorize for your insurance to be billed for the services provided during this visit.  The patient expressed understanding and agreed to proceed.  The following staff members participated in the virtual visit: [place], [place]    PRENATAL VISIT NOTE  Subjective:  Haley Potts is a 23 y.o. G3P1011 at [redacted]w[redacted]d  for phone visit for ongoing prenatal care.  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  Patient reports heartburn.  Contractions: Not present. Vag. Bleeding: None.  Movement: Present. Denies leaking of fluid.   The following portions of the patient's history were reviewed and updated as appropriate: allergies, current medications, past family history, past medical history, past social history, past surgical history and problem list.   Objective:   Vitals:   02/12/19 1302  BP: 124/79  Pulse: (!) 113   Self-Obtained  Fetal Status:     Movement: Present     Assessment and Plan:  Pregnancy: G3P1011 at [redacted]w[redacted]d 1. Encounter for supervision of low-risk pregnancy in second trimester - Reviewed [place] results,  EIF.   2. Gastroesophageal reflux during pregnancy in second trimester, antepartum - Discussed diet changes, food log - famotidine-calcium carbonate-magnesium hydroxide (PEPCID COMPLETE) [phone] MG chewable tablet; Chew 2 tablets by mouth 2 (two) times daily as needed.  Dispense: 60 tablet; Refill: 4  3. 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  Preterm labor symptoms and general obstetric precautions including but not limited to vaginal bleeding, contractions, leaking of fluid and fetal movement were reviewed in detail with the patient.  Return in about 7 weeks (around 04/02/2019) for ROB/GTT, In-person.  No future appointments.   Time spent on virtual visit: 12 minutes  Manya Silvas, [place]

## 2019-02-15 ENCOUNTER — Telehealth (HOSPITAL_COMMUNITY): Payer: Self-pay | Admitting: Genetic Counselor

## 2019-02-15 NOTE — Telephone Encounter (Signed)
I called Ms. Matranga to check in re: partner carrier screening for SMA. Ms. Imler indicated that she wanted her partner to get carrier screening but that he was not interested. I told her that another viable option would be for me to have a laboratory called [facility] mail her partner a saliva kit if coming in to the clinic to get his blood drawn in person is a barrier. Ms. Mcgregory indicated that she would talk about this option with him too. I encouraged Ms. Tricarico to reach out to me if her partner was interested in receiving a saliva kit, and that if she does not call me I will assume he is not interested in this SMA carrier screening option either.  Haley E Hill, MS Genetic Counselor 

## 2019-03-20 ENCOUNTER — Telehealth: Payer: Self-pay | Admitting: Student

## 2019-03-20 NOTE — Telephone Encounter (Signed)
Attempted to contact patient about her appointment time change on 9/29. No answer, left voicemail instructing patient that her new appointment time for 9/29 is 8:15. Patient instructed to give the office a call with any concerns.

## 2019-03-24 ENCOUNTER — Other Ambulatory Visit: Payer: Self-pay

## 2019-03-24 ENCOUNTER — Encounter (HOSPITAL_COMMUNITY): Payer: Self-pay

## 2019-03-24 ENCOUNTER — Inpatient Hospital Stay (HOSPITAL_COMMUNITY)
Admission: AD | Admit: 2019-03-24 | Discharge: 2019-03-24 | Disposition: A | Discharge status: Home / Self Care | Payer: Medicaid Other | Attending: Obstetrics and Gynecology | Admitting: Obstetrics and Gynecology

## 2019-03-24 DIAGNOSIS — D649 Anemia, unspecified: Secondary | ICD-10-CM | POA: Insufficient documentation

## 2019-03-24 DIAGNOSIS — E860 Dehydration: Secondary | ICD-10-CM | POA: Diagnosis not present

## 2019-03-24 DIAGNOSIS — Z3A26 26 weeks gestation of pregnancy: Secondary | ICD-10-CM | POA: Diagnosis not present

## 2019-03-24 DIAGNOSIS — O99282 Endocrine, nutritional and metabolic diseases complicating pregnancy, second trimester: Secondary | ICD-10-CM | POA: Insufficient documentation

## 2019-03-24 DIAGNOSIS — O26891 Other specified pregnancy related conditions, first trimester: Secondary | ICD-10-CM

## 2019-03-24 DIAGNOSIS — R55 Syncope and collapse: Secondary | ICD-10-CM | POA: Diagnosis not present

## 2019-03-24 DIAGNOSIS — O99012 Anemia complicating pregnancy, second trimester: Secondary | ICD-10-CM | POA: Diagnosis not present

## 2019-03-24 DIAGNOSIS — O9989 Other specified diseases and conditions complicating pregnancy, childbirth and the puerperium: Secondary | ICD-10-CM | POA: Insufficient documentation

## 2019-03-24 LAB — URINALYSIS, ROUTINE W REFLEX MICROSCOPIC
Bilirubin Urine: NEGATIVE
Glucose, UA: NEGATIVE mg/dL
Hgb urine dipstick: NEGATIVE
Ketones, ur: NEGATIVE mg/dL
Nitrite: NEGATIVE
Protein, ur: 30 mg/dL — AB
Specific Gravity, Urine: 1.023 (ref 1.005–1.030)
pH: 5 (ref 5.0–8.0)

## 2019-03-24 LAB — CBC
HCT: 32.1 % — ABNORMAL LOW (ref 36.0–46.0)
Hemoglobin: 10.6 g/dL — ABNORMAL LOW (ref 12.0–15.0)
MCH: 29 pg (ref 26.0–34.0)
MCHC: 33 g/dL (ref 30.0–36.0)
MCV: 87.9 fL (ref 80.0–100.0)
Platelets: 282 10*3/uL (ref 150–400)
RBC: 3.65 MIL/uL — ABNORMAL LOW (ref 3.87–5.11)
RDW: 12.8 % (ref 11.5–15.5)
WBC: 8 10*3/uL (ref 4.0–10.5)
nRBC: 0.2 % (ref 0.0–0.2)

## 2019-03-24 MED ORDER — FERROUS SULFATE 325 (65 FE) MG PO TBEC: 325.0000 mg | DELAYED_RELEASE_TABLET | ORAL | 1 refills | Status: DC

## 2019-03-24 NOTE — MAU Note (Signed)
Pt reports that earlier today around 1400 she felt like she got dizzy and was going to pass out. States she was driving when she first started feeling dizzy, rolled down the windows, which helped some. Then she got home and was going up some stairs and felt like she was going to pass out.This is the 2nd or 3rd time that this has occurred. Reports that she has had 2 bottles of water to drink today and has only had ice cream and cereal today.

## 2019-03-24 NOTE — MAU Provider Note (Signed)
History     [id number]  Arrival date and time: 03/24/19 1954    Chief Complaint  Patient presents with  . Dizziness     HPI Haley Potts is a 23 y.o. at 27w3dby 5w [place] who presents for lightheadedness and presyncope.  Sensation comes and goes, does not happen every day Happened today while she was walking in the house and earlier while driving Was driving to her mothers house and felt lightheaded Arrived and was going up steps and when she walked into her house she felt faint Like her legs were going to give out and room felt like it was spinning Went to lay down and took a nap Felt better once she woke up Denies chest pain or SOB, leg swelling Reports she thinks she's not drinking enough water Has been snacking more than eating regular meals because she feels like she over eats Has been doing this for about a week Mostly eating breakfast and dinner but only snacking in between  Vaginal bleeding: No LOF: No Fetal Movement: Yes Contractions: No  A/Positive/-- (06/09 1514)  OB History    Gravida  3   Para  1   Term  1   Preterm  0   AB  1   Living  1     SAB  1   TAB  0   Ectopic  0   Multiple  0   Live Births  1           Past Medical History:  Diagnosis Date  . Depression    Post partum   * lasted 4 months    Past Surgical History:  Procedure Laterality Date  . WISDOM TOOTH EXTRACTION      History reviewed. No pertinent family history.  Social History   Socioeconomic History  . Marital status: Single    Spouse name: Not on file  . Number of children: Not on file  . Years of education: Not on file  . Highest education level: Not on file  Occupational History  . Not on file  Social Needs  . Financial resource strain: Not on file  . Food insecurity    Worry: Never true    Inability: Never true  . Transportation needs    Medical: No    Non-medical: No  Tobacco Use  . Smoking status: Never Smoker  . Smokeless tobacco:  Never Used  Substance and Sexual Activity  . Alcohol use: No    Alcohol/week: 0.0 standard drinks  . Drug use: No  . Sexual activity: Yes    Partners: Male    Birth control/protection: None  Lifestyle  . Physical activity    Days per week: Not on file    Minutes per session: Not on file  . Stress: Not on file  Relationships  . Social [place] phone: Not on file    Gets together: Not on file    Attends religious service: Not on file    Active member of club or organization: Not on file    Attends meetings of clubs or organizations: Not on file    Relationship status: Not on file  . Intimate partner violence    Fear of current or ex partner: No    Emotionally abused: No    Physically abused: No    Forced sexual activity: No  Other Topics Concern  . Not on file  Social History Narrative  . Not on file  No Known Allergies  No current facility-administered medications on file prior to encounter.    Current Outpatient Medications on File Prior to Encounter  Medication Sig Dispense Refill  . AMBULATORY NON FORMULARY MEDICATION 1 Device by Other route once a week. Blood pressure cuff/Monitored regularly at home.  ICD 10: Z34.90 1 kit 0  . famotidine (PEPCID) 20 MG tablet Take 1 tablet (20 mg total) by mouth 2 (two) times daily. (Patient not taking: Reported on 02/05/2019) 30 tablet 0  . famotidine-calcium carbonate-magnesium hydroxide (PEPCID COMPLETE) 10-800-165 MG chewable tablet Chew 2 tablets by mouth 2 (two) times daily as needed. 60 tablet 4  . metoCLOPramide (REGLAN) 10 MG tablet Take 1 tablet (10 mg total) by mouth every 6 (six) hours. (Patient not taking: Reported on 12/07/2018) 30 tablet 0  . Prenatal Vit-Fe Fumarate-FA (PRENATAL MULTIVITAMIN) TABS tablet Take 1 tablet by mouth daily at 12 noon.    . promethazine (PHENERGAN) 12.5 MG tablet Take 1 tablet (12.5 mg total) by mouth every 6 (six) hours as needed for nausea or vomiting. Take at bedtime (Patient not  taking: Reported on 02/05/2019) 30 tablet 1     ROS Complete ROS completed and otherwise negative except as noted in HPI  Physical Exam   BP 117/62 (BP Location: Right Arm)   Pulse 89   Temp 98.6 F (37 C) (Oral)   Resp 16   Ht 5' 4"  (1.626 m)   Wt 70.9 kg   LMP 09/20/2018 (Exact Date)   SpO2 99%   BMI 26.81 kg/m   Physical Exam  Vitals reviewed. Constitutional: She appears well-developed and well-nourished. No distress.  Eyes: No scleral icterus.  Cardiovascular: Normal rate, regular rhythm and normal heart sounds.  No murmur heard. Respiratory: Effort normal and breath sounds normal. No respiratory distress. She has no wheezes. She has no rales.  GI: Soft. Bowel sounds are normal. She exhibits no distension. There is no abdominal tenderness. There is no rebound and no guarding.  Musculoskeletal:        General: No edema.  Neurological: She is alert. Coordination normal.  Skin: Skin is warm and dry. She is not diaphoretic.  Psychiatric: She has a normal mood and affect.   Orthostatics NEGATIVE  Bedside Ultrasound Not performed  FHT 130, moderate variability, +accels, no decels, no contractions Reactive NST   Results for orders placed or performed during the hospital encounter of 03/24/19 (from the past 24 hour(s))  CBC     Status: Abnormal   Collection Time: 03/24/19  8:47 PM  Result Value Ref Range   WBC 8.0 4.0 - 10.5 K/uL   RBC 3.65 (L) 3.87 - 5.11 MIL/uL   Hemoglobin 10.6 (L) 12.0 - 15.0 g/dL   HCT 32.1 (L) 36.0 - 46.0 %   MCV 87.9 80.0 - 100.0 fL   MCH 29.0 26.0 - 34.0 pg   MCHC 33.0 30.0 - 36.0 g/dL   RDW 12.8 11.5 - 15.5 %   Platelets 282 150 - 400 K/uL   nRBC 0.2 0.0 - 0.2 %  Urinalysis, Routine w reflex microscopic     Status: Abnormal   Collection Time: 03/24/19  8:54 PM  Result Value Ref Range   Color, Urine YELLOW YELLOW   APPearance HAZY (A) CLEAR   Specific Gravity, Urine 1.023 1.005 - 1.030   pH 5.0 5.0 - 8.0   Glucose, UA NEGATIVE  NEGATIVE mg/dL   Hgb urine dipstick NEGATIVE NEGATIVE   Bilirubin Urine NEGATIVE NEGATIVE   Ketones,  ur NEGATIVE NEGATIVE mg/dL   Protein, ur 30 (A) NEGATIVE mg/dL   Nitrite NEGATIVE NEGATIVE   Leukocytes,Ua SMALL (A) NEGATIVE   RBC / HPF 0-5 0 - 5 RBC/hpf   WBC, UA 6-10 0 - 5 WBC/hpf   Bacteria, UA RARE (A) NONE SEEN   Squamous Epithelial / LPF 0-5 0 - 5   Mucus PRESENT    Ca Oxalate Crys, UA PRESENT      MAU Course  Procedures NST UA CBC Orthostatic vitals  MDM Moderate  Assessment and Plan  #Mild dehydration #Presyncope Patient reporting two episodes of presyncope, most c/w mild dehydration given elevated SG on UA and report of abnormal eating pattern. Reviewed healthy eating pattern in pregnancy with good water intake, 3 meals with snacks in between. Unlikely cardiac with normal VS and normal ECG. Mild anemia with hgb 10.8 but unlikely to be contributing to symptoms at that level, rx iron. Symptoms not c/w vertigo.  #FWB Cat I with reactive NST  Clarnce Flock

## 2019-03-24 NOTE — Discharge Instructions (Signed)
Eating Plan for Pregnant Women °While you are pregnant, your body requires additional nutrition to help support your growing baby. You also have a higher need for some vitamins and minerals, such as folic acid, calcium, iron, and vitamin D. Eating a healthy, well-balanced diet is very important for your health and your baby's health. Your need for extra calories varies for the three 3-month segments of your pregnancy (trimesters). For most women, it is recommended to consume: °· 150 extra calories a day during the first trimester. °· 300 extra calories a day during the second trimester. °· 300 extra calories a day during the third trimester. °What are tips for following this plan? ° °· Do not try to lose weight or go on a diet during pregnancy. °· Limit your overall intake of foods that have "empty calories." These are foods that have little nutritional value, such as sweets, desserts, candies, and sugar-sweetened beverages. °· Eat a variety of foods (especially fruits and vegetables) to get a full range of vitamins and minerals. °· Take a prenatal vitamin to help meet your additional vitamin and mineral needs during pregnancy, specifically for folic acid, iron, calcium, and vitamin D. °· Remember to stay active. Ask your health care provider what types of exercise and activities are safe for you. °· Practice good food safety and cleanliness. Wash your hands before you eat and after you prepare raw meat. Wash all fruits and vegetables well before peeling or eating. Taking these actions can help to prevent food-borne illnesses that can be very dangerous to your baby, such as listeriosis. Ask your health care provider for more information about listeriosis. °What does 150 extra calories look like? °Healthy options that provide 150 extra calories each day could be any of the following: °· 6-8 oz (170-230 g) of plain low-fat yogurt with ½ cup of berries. °· 1 apple with 2 teaspoons (11 g) of peanut butter. °· Cut-up  vegetables with ¼ cup (60 g) of hummus. °· 8 oz (230 mL) or 1 cup of low-fat chocolate milk. °· 1 stick of string cheese with 1 medium orange. °· 1 peanut butter and jelly sandwich that is made with one slice of whole-wheat bread and 1 tsp (5 g) of peanut butter. °For 300 extra calories, you could eat two of those healthy options each day. °What is a healthy amount of weight to gain? °The right amount of weight gain for you is based on your BMI before you became pregnant. If your BMI: °· Was less than 18 (underweight), you should gain 28-40 lb (13-18 kg). °· Was 18-24.9 (normal), you should gain 25-35 lb (11-16 kg). °· Was 25-29.9 (overweight), you should gain 15-25 lb (7-11 kg). °· Was 30 or greater (obese), you should gain 11-20 lb (5-9 kg). °What if I am having twins or multiples? °Generally, if you are carrying twins or multiples: °· You may need to eat 300-600 extra calories a day. °· The recommended range for total weight gain is 25-54 lb (11-25 kg), depending on your BMI before pregnancy. °· Talk with your health care provider to find out about nutritional needs, weight gain, and exercise that is right for you. °What foods can I eat? ° °Grains °All grains. Choose whole grains, such as whole-wheat bread, oatmeal, or brown rice. °Vegetables °All vegetables. Eat a variety of colors and types of vegetables. Remember to wash your vegetables well before peeling or eating. °Fruits °All fruits. Eat a variety of colors and types of fruit. Remember to wash   your fruits well before peeling or eating. °Meats and other protein foods °Lean meats, including chicken, turkey, fish, and lean cuts of beef, veal, or pork. If you eat fish or seafood, choose options that are higher in omega-3 fatty acids and lower in mercury, such as salmon, herring, mussels, trout, sardines, pollock, shrimp, crab, and lobster. Tofu. Tempeh. Beans. Eggs. Peanut butter and other nut butters. Make sure that all meats, poultry, and eggs are cooked to  food-safe temperatures or "well-done." °Two or more servings of fish are recommended each week in order to get the most benefits from omega-3 fatty acids that are found in seafood. Choose fish that are lower in mercury. You can find more information online: °· www.fda.gov °Dairy °Pasteurized milk and milk alternatives (such as almond milk). Pasteurized yogurt and pasteurized cheese. Cottage cheese. Sour cream. °Beverages °Water. Juices that contain 100% fruit juice or vegetable juice. Caffeine-free teas and decaffeinated coffee. °Drinks that contain caffeine are okay to drink, but it is better to avoid caffeine. Keep your total caffeine intake to less than 200 mg each day (which is 12 oz or 355 mL of coffee, tea, or soda) or the limit as told by your health care provider. °Fats and oils °Fats and oils are okay to include in moderation. °Sweets and desserts °Sweets and desserts are okay to include in moderation. °Seasoning and other foods °All pasteurized condiments. °The items listed above may not be a complete list of recommended foods and beverages. Contact your dietitian for more options. °The items listed above may not be a complete list of foods and beverages [you/your child] can eat. Contact a dietitian for more information. °What foods are not recommended? °Vegetables °Raw (unpasteurized) vegetable juices. °Fruits °Unpasteurized fruit juices. °Meats and other protein foods °Lunch meats, bologna, hot dogs, or other deli meats. (If you must eat those meats, reheat them until they are steaming hot.) Refrigerated paté, meat spreads from a meat counter, smoked seafood that is found in the refrigerated section of a store. Raw or undercooked meats, poultry, and eggs. Raw fish, such as sushi or sashimi. Fish that have high mercury content, such as tilefish, shark, swordfish, and king mackerel. °To learn more about mercury in fish, talk with your health care provider or look for online resources, such  as: °· www.fda.gov °Dairy °Raw (unpasteurized) milk and any foods that have raw milk in them. Soft cheeses, such as feta, queso blanco, queso fresco, Brie, Camembert cheeses, blue-veined cheeses, and Panela cheese (unless it is made with pasteurized milk, which must be stated on the label). °Beverages °Alcohol. Sugar-sweetened beverages, such as sodas, teas, or energy drinks. °Seasoning and other foods °Homemade fermented foods and drinks, such as pickles, sauerkraut, or kombucha drinks. (Store-bought pasteurized versions of these are okay.) °Salads that are made in a store or deli, such as ham salad, chicken salad, egg salad, tuna salad, and seafood salad. °The items listed above may not be a complete list of foods and beverages to avoid. Contact your dietitian for more information. °The items listed above may not be a complete list of foods and beverages [you/your child] should avoid. Contact a dietitian for more information. °Where to find more information °To calculate the number of calories you need based on your height, weight, and activity level, you can use an online calculator such as: °· www.choosemyplate.gov/MyPlatePlan °To calculate how much weight you should gain during pregnancy, you can use an online pregnancy weight gain calculator such as: °· www.choosemyplate.gov/pregnancy-weight-gain-calculator °Summary °· While you   are pregnant, your body requires additional nutrition to help support your growing baby. °· Eat a variety of foods, especially fruits and vegetables to get a full range of vitamins and minerals. °· Practice good food safety and cleanliness. Wash your hands before you eat and after you prepare raw meat. Wash all fruits and vegetables well before peeling or eating. Taking these actions can help to prevent food-borne illnesses, such as listeriosis, that can be very dangerous to your baby. °· Do not eat raw meat or fish. Do not eat fish that have high mercury content, such as tilefish,  shark, swordfish, and king mackerel. Do not eat unpasteurized (raw) dairy. °· Take a prenatal vitamin to help meet your additional vitamin and mineral needs during pregnancy, specifically for folic acid, iron, calcium, and vitamin D. °This information is not intended to replace advice given to you by your health care provider. Make sure you discuss any questions you have with your health care provider. °Document Released: 04/04/2014 Document Revised: 10/11/2018 Document Reviewed: 03/17/2017 °Elsevier Patient Education © 2020 Else[employer] Inc. ° °

## 2019-04-01 ENCOUNTER — Telehealth: Payer: Self-pay | Admitting: Student

## 2019-04-01 ENCOUNTER — Other Ambulatory Visit: Payer: Self-pay | Admitting: *Deleted

## 2019-04-01 DIAGNOSIS — Z3490 Encounter for supervision of normal pregnancy, unspecified, unspecified trimester: Secondary | ICD-10-CM

## 2019-04-01 NOTE — Telephone Encounter (Signed)
Attempted to call patient about her appointment on 9/29 @ 8:15. No answer left voicemail instructing patient to wear a face mask for the entire appointment and no visitors are allowed during the visit. Patient instructed not to attend the appointment if she was any symptoms. Symptom list and office number left.

## 2019-04-02 ENCOUNTER — Telehealth: Payer: Self-pay | Admitting: Clinical

## 2019-04-02 ENCOUNTER — Other Ambulatory Visit: Payer: Medicaid Other

## 2019-04-02 ENCOUNTER — Other Ambulatory Visit: Payer: Self-pay

## 2019-04-02 ENCOUNTER — Ambulatory Visit (INDEPENDENT_AMBULATORY_CARE_PROVIDER_SITE_OTHER): Payer: Medicaid Other | Admitting: Student

## 2019-04-02 ENCOUNTER — Encounter: Payer: Medicaid Other | Admitting: Obstetrics & Gynecology

## 2019-04-02 VITALS — BP 105/61 | HR 88 | Temp 98.4°F | Wt 158.0 lb

## 2019-04-02 DIAGNOSIS — Z3490 Encounter for supervision of normal pregnancy, unspecified, unspecified trimester: Secondary | ICD-10-CM

## 2019-04-02 DIAGNOSIS — Z3492 Encounter for supervision of normal pregnancy, unspecified, second trimester: Secondary | ICD-10-CM

## 2019-04-02 DIAGNOSIS — Z23 Encounter for immunization: Secondary | ICD-10-CM | POA: Diagnosis not present

## 2019-04-02 DIAGNOSIS — Z1331 Encounter for screening for depression: Secondary | ICD-10-CM

## 2019-04-02 DIAGNOSIS — Z3A27 27 weeks gestation of pregnancy: Secondary | ICD-10-CM

## 2019-04-02 MED ORDER — TETANUS-DIPHTH-ACELL PERTUSSIS 5-2.5-18.5 LF-MCG/0.5 IM SUSP
0.5000 mL | Freq: Once | INTRAMUSCULAR | Status: AC
  Administered 2019-04-02: 0.5 mL via INTRAMUSCULAR

## 2019-04-02 NOTE — Progress Notes (Signed)
Patient ID: Haley Potts, female   DOB: March 24, 1996, 23 y.o.   MRN: [id number]   PRENATAL VISIT NOTE  Subjective:  Haley Potts is a 23 y.o. G3P1011 at [redacted]w[redacted]d being seen today for ongoing prenatal care.  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  Patient reports feeling moody and hormonal. She has not been going anywhere or seeing friends. She says things are fine at home wiht her boyfriend, but she is isolated. .  Contractions: Not present. Vag. Bleeding: None.  Movement: Present. Denies leaking of fluid.   The following portions of the patient's history were reviewed and updated as appropriate: allergies, current medications, past family history, past medical history, past social history, past surgical history and problem list.   Objective:   Vitals:   04/02/19 0848  BP: 105/61  Pulse: 88  Temp: 98.4 F (36.9 C)  Weight: 158 lb (71.7 kg)    Fetal Status: Fetal Heart Rate (bpm): 138 Fundal Height: 26 cm Movement: Present     General:  Alert, oriented and cooperative. Patient is in no acute distress.  Skin: Skin is warm and dry. No rash noted.   Cardiovascular: Normal heart rate noted  Respiratory: Normal respiratory effort, no problems with respiration noted  Abdomen: Soft, gravid, appropriate for gestational age.  Pain/Pressure: Absent     Pelvic: Cervical exam deferred        Extremities: Normal range of motion.  Edema: None  Mental Status: Normal mood and affect. Normal behavior. Normal judgment and thought content.   Assessment and Plan:  Pregnancy: G3P1011 at [redacted]w[redacted]d 1. Encounter for supervision of low-risk pregnancy, antepartum -has not checked BPs because she lost her cuff-emphasized to her that she needs to find her BP cuff or she will need to purchase one  -elevated Edinburgh score; will schedule with Roselyn Reef either today or this week -FOB does not want to get tested for genetics -Discussed BC: she did not like shot or nexplanon. Will consider IUD PP - Tdap (BOOSTRIX) injection 0.5 mL  2. Needs flu shot  - Flu Vaccine QUAD 36+ mos IM  Preterm labor symptoms and general obstetric precautions including but not limited to vaginal bleeding, contractions, leaking of fluid and fetal movement were reviewed in detail with the patient. Please refer to After Visit Summary for other counseling recommendations.   Return in about 4 weeks (around 04/30/2019), or fup BP check with RN,LROB in 4 weeks. appt with Roselyn Reef this week if she does not see Roselyn Reef today..  Future Appointments  Date Time Provider [place]  04/03/2019  9:45 AM [place]  04/30/2019  8:15 AM Nugent, Gerrie Nordmann, NP [facility]    Mervyn Skeeters [place], [place]

## 2019-04-02 NOTE — Addendum Note (Signed)
Addended by: Langston Reusing on: 04/02/2019 10:25 AM   Modules accepted: Orders

## 2019-04-02 NOTE — Telephone Encounter (Signed)
Attempted to call patient about her appointment on 9/30 @ 9:45. No answer, voicemail was left instructing patient that the visit is a telephone visit and she does not have to come to the office. Patient instructed to be available around her appointment time. Patient instructed to give the office a call back if she is needing to reschedule.

## 2019-04-02 NOTE — Progress Notes (Signed)
Pt has not checked BP in 2 weeks - states she misplaced the machine. Pt has not picked up meds from pharmacy - FeSO4 and Pepcid.  2hr GTT today. Pt consents to Tdap & flu vaccine today.

## 2019-04-03 ENCOUNTER — Ambulatory Visit: Payer: Medicaid Other | Admitting: Clinical

## 2019-04-03 DIAGNOSIS — Z5329 Procedure and treatment not carried out because of patient's decision for other reasons: Secondary | ICD-10-CM

## 2019-04-03 DIAGNOSIS — Z91199 Patient's noncompliance with other medical treatment and regimen due to unspecified reason: Secondary | ICD-10-CM

## 2019-04-03 LAB — CBC
Hematocrit: 30 % — ABNORMAL LOW (ref 34.0–46.6)
Hemoglobin: 10.1 g/dL — ABNORMAL LOW (ref 11.1–15.9)
MCH: 28.3 pg (ref 26.6–33.0)
MCHC: 33.7 g/dL (ref 31.5–35.7)
MCV: 84 fL (ref 79–97)
Platelets: 272 10*3/uL (ref 150–450)
RBC: 3.57 x10E6/uL — ABNORMAL LOW (ref 3.77–5.28)
RDW: 12.4 % (ref 11.7–15.4)
WBC: 8.9 10*3/uL (ref 3.4–10.8)

## 2019-04-03 LAB — GLUCOSE TOLERANCE, 2 HOURS W/ 1HR
Glucose, 1 hour: 130 mg/dL (ref 65–179)
Glucose, 2 hour: 94 mg/dL (ref 65–152)
Glucose, Fasting: 86 mg/dL (ref 65–91)

## 2019-04-03 LAB — RPR: RPR Ser Ql: NONREACTIVE

## 2019-04-03 LAB — HIV ANTIBODY (ROUTINE TESTING W REFLEX): HIV Screen 4th Generation wRfx: NONREACTIVE

## 2019-04-03 NOTE — BH Specialist Note (Signed)
Pt did not arrive to video visit and did not answer the phone; Left HIPPA-compliant message to call back Roselyn Reef from [facility] at [phone], and left MyChart message for patient.   [place] via Telemedicine Video Visit  04/03/2019 KENSY BLIZARD [phone]  Garlan Fair

## 2019-04-29 NOTE — Progress Notes (Signed)
Subjective:  Haley Potts is a 23 y.o. G3P1011 at [redacted]w[redacted]d being seen today for ongoing prenatal care.  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  Patient reports no complaints.  Contractions: Not present. Vag. Bleeding: None.  Movement: Present. Denies leaking of fluid.   The following portions of the patient's history were reviewed and updated as appropriate: allergies, current medications, past family history, past medical history, past social history, past surgical history and problem list. Problem list updated.  Objective:   Vitals:   04/30/19 0825  BP: 109/67  Pulse: 92  Temp: 98.6 F (37 C)  Weight: 161 lb (73 kg)    Fetal Status: Fetal Heart Rate (bpm): 132 Fundal Height: 32 cm Movement: Present     General:  Alert, oriented and cooperative. Patient is in no acute distress.  Skin: Skin is warm and dry. No rash noted.   Cardiovascular: Normal heart rate noted  Respiratory: Normal respiratory effort, no problems with respiration noted  Abdomen: Soft, gravid, appropriate for gestational age. Pain/Pressure: Present     Pelvic: Vag. Bleeding: None     Cervical exam deferred        Extremities: Normal range of motion.  Edema: Trace  Mental Status: Normal mood and affect. Normal behavior. Normal judgment and thought content.    Assessment and Plan:  Pregnancy: G3P1011 at [redacted]w[redacted]d  1. Encounter for supervision of low-risk pregnancy in third trimester -pt reports she got another BP cuff for home use -pt reports she has not rescheduled with [facility] for elevated Edinburgh score, and is not interested in rescheduling at this time. Discussed importance, along with possible postpartum complications and encouraged a visit to the emergency room if SI/HI devcelops. Pt denies SI/HI today.  2.  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 Please refer to After Visit Summary for other counseling recommendations.  Return in about 4 weeks (around 05/28/2019) for in-person ROB, GBS/cultures.   Darcelle Herrada, Gerrie Nordmann, NP

## 2019-04-30 ENCOUNTER — Ambulatory Visit (INDEPENDENT_AMBULATORY_CARE_PROVIDER_SITE_OTHER): Payer: Medicaid Other | Admitting: Women's Health

## 2019-04-30 ENCOUNTER — Other Ambulatory Visit: Payer: Self-pay

## 2019-04-30 VITALS — BP 109/67 | HR 92 | Temp 98.6°F | Wt 161.0 lb

## 2019-04-30 DIAGNOSIS — Z3493 Encounter for supervision of normal pregnancy, unspecified, third trimester: Secondary | ICD-10-CM

## 2019-04-30 DIAGNOSIS — Z148 Genetic carrier of other disease: Secondary | ICD-10-CM

## 2019-04-30 DIAGNOSIS — Z3A31 31 weeks gestation of pregnancy: Secondary | ICD-10-CM

## 2019-04-30 NOTE — Patient Instructions (Signed)
Postpartum Baby Blues The postpartum period begins right after the birth of a baby. During this time, there is often a lot of joy and excitement. It is also a time of many changes in the life of the parents. No matter how many times a mother gives birth, each child brings new challenges to the family, including different ways of relating to one another. It is common to have feelings of excitement along with confusing changes in moods, emotions, and thoughts. You may feel happy one minute and sad or stressed the next. These feelings of sadness usually happen in the period right after you have your baby, and they go away within a week or two. This is called the "baby blues." What are the causes? There is no known cause of baby blues. It is likely caused by a combination of factors. However, changes in hormone levels after childbirth are believed to trigger some of the symptoms. Other factors that can play a role in these mood changes include:  Lack of sleep.  Stressful life events, such as poverty, caring for a loved one, or death of a loved one.  Genetics. What are the signs or symptoms? Symptoms of this condition include:  Brief changes in mood, such as going from extreme happiness to sadness.  Decreased concentration.  Difficulty sleeping.  Crying spells and tearfulness.  Loss of appetite.  Irritability.  Anxiety. If the symptoms of baby blues last for more than 2 weeks or become more severe, you may have postpartum depression. How is this diagnosed? This condition is diagnosed based on an evaluation of your symptoms. There are no medical or lab tests that lead to a diagnosis, but there are various questionnaires that a health care provider may use to identify women with the baby blues or postpartum depression. How is this treated? Treatment is not needed for this condition. The baby blues usually go away on their own in 1-2 weeks. Social support is often all that is needed. You will  be encouraged to get adequate sleep and rest. Follow these instructions at home: Lifestyle      Get as much rest as you can. Take a nap when the baby sleeps.  Exercise regularly as told by your health care provider. Some women find yoga and walking to be helpful.  Eat a balanced and nourishing diet. This includes plenty of fruits and vegetables, whole grains, and lean proteins.  Do little things that you enjoy. Have a cup of tea, take a bubble bath, read your favorite magazine, or listen to your favorite music.  Avoid alcohol.  Ask for help with household chores, cooking, grocery shopping, or running errands. Do not try to do everything yourself. Consider hiring a postpartum doula to help. This is a professional who specializes in providing support to new mothers.  Try not to make any major life changes during pregnancy or right after giving birth. This can add stress. General instructions  Talk to people close to you about how you are feeling. Get support from your partner, family members, friends, or other new moms. You may want to join a support group.  Find ways to cope with stress. This may include: ? Writing your thoughts and feelings in a journal. ? Spending time outside. ? Spending time with people who make you laugh.  Try to stay positive in how you think. Think about the things you are grateful for.  Take over-the-counter and prescription medicines only as told by your health care provider.    Let your health care provider know if you have any concerns.  Keep all postpartum visits as told by your health care provider. This is important. Contact a health care provider if:  Your baby blues do not go away after 2 weeks. Get help right away if:  You have thoughts of taking your own life (suicidal thoughts).  You think you may harm the baby or other people.  You see or hear things that are not there (hallucinations). Summary  After giving birth, you may feel happy  one minute and sad or stressed the next. Feelings of sadness that happen right after the baby is born and go away after a week or two are called the "baby blues."  You can manage the baby blues by getting enough rest, eating a healthy diet, exercising, spending time with supportive people, and finding ways to cope with stress.  If feelings of sadness and stress last longer than 2 weeks or get in the way of caring for your baby, talk to your health care provider. This may mean you have postpartum depression. This information is not intended to replace advice given to you by your health care provider. Make sure you discuss any questions you have with your health care provider. Document Released: 03/24/2004 Document Revised: 10/12/2018 Document Reviewed: 08/16/2016 Elsevier Patient Education  2020 [employer] Perinatal Depression When a woman feels excessive sadness, anger, or anxiety during pregnancy or during the first 12 months after she gives birth, she has a condition called perinatal depression. Depression can interfere with work, school, relationships, and other everyday activities. If it is not managed properly, it can also cause problems in the mother and her baby. Sometimes, perinatal depression is left untreated because symptoms are thought to be normal mood swings during and right after pregnancy. If you have symptoms of depression, it is important to talk with your health care provider. What are the causes? The exact cause of this condition is not known. Hormonal changes during and after pregnancy may play a role in causing perinatal depression. What increases the risk? You are more likely to develop this condition if:  You have a personal or family history of depression, anxiety, or mood disorders.  You experience a stressful life event during pregnancy, such as the death of a loved one.  You have a lot of regular life stress.  You do not have support from family members or loved  ones, or you are in an abusive relationship. What are the signs or symptoms? Symptoms of this condition include:  Feeling sad or hopeless.  Feelings of guilt.  Feeling irritable or overwhelmed.  Changes in your appetite.  Lack of energy or motivation.  Sleep problems.  Difficulty concentrating or completing tasks.  Loss of interest in hobbies or relationships.  Headaches or stomach problems that do not go away. How is this diagnosed? This condition is diagnosed based on a physical exam and mental evaluation. In some cases, your health care provider may use a depression screening tool. These tools include a list of questions that can help a health care provider diagnose depression. Your health care provider may refer you to a mental health expert who specializes in depression. How is this treated? This condition may be treated with:  Medicines. Your health care provider will only give you medicines that have been proven safe for pregnancy and breastfeeding.  Talk therapy with a mental health professional to help change your patterns of thinking (cognitive behavioral therapy).  Support groups.  Brain stimulation or light therapies.  Stress reduction therapies, such as mindfulness. Follow these instructions at home: Lifestyle  Do not use any products that contain nicotine or tobacco, such as cigarettes and e-cigarettes. If you need help quitting, ask your health care provider.  Do not use alcohol when you are pregnant. After your baby is born, limit alcohol intake to no more than 1 drink a day. One drink equals 12 oz of beer, 5 oz of wine, or 1 oz of hard liquor.  Consider joining a support group for new mothers. Ask your health care provider for recommendations.  Take good care of yourself. Make sure you: ? Get plenty of sleep. If you are having trouble sleeping, talk with your health care provider. ? Eat a healthy diet. This includes plenty of fruits and vegetables,  whole grains, and lean proteins. ? Exercise regularly, as told by your health care provider. Ask your health care provider what exercises are safe for you. General instructions  Take over-the-counter and prescription medicines only as told by your health care provider.  Talk with your partner or family members about your feelings during pregnancy. Share any concerns or anxieties that you may have.  Ask for help with tasks or chores when you need it. Ask friends and family members to provide meals, watch your children, or help with cleaning.  Keep all follow-up visits as told by your health care provider. This is important. Contact a health care provider if:  You (or people close to you) notice that you have any symptoms of depression.  You have depression and your symptoms get worse.  You experience side effects from medicines, such as nausea or sleep problems. Get help right away if:  You feel like hurting yourself, your baby, or someone else. If you ever feel like you may hurt yourself or others, or have thoughts about taking your own life, get help right away. You can go to your nearest emergency department or call:  Your local emergency services (911 in the [place]  A suicide crisis helpline, such as the [facility] at (917)489-6291. This is open 24 hours a day. Summary  Perinatal depression is when a woman feels excessive sadness, anger, or anxiety during pregnancy or during the first 12 months after she gives birth.  If perinatal depression is not treated, it can lead to health problems for the mother and her baby.  This condition is treated with medicines, talk therapy, stress reduction therapies, or a combination of two or more treatments.  Talk with your partner or family members about your feelings. Do not be afraid to ask for help. This information is not intended to replace advice given to you by your health care provider. Make sure you discuss  any questions you have with your health care provider. Document Released: 08/17/2016 Document Revised: 06/23/2017 Document Reviewed: 08/17/2016 Elsevier Patient Education  [place] When a woman feels excessive tension or worry (anxiety) during pregnancy or during the first 12 months after she gives birth, she has a condition called perinatal anxiety. Anxiety can interfere with work, school, relationships, and other everyday activities. If it is not managed properly, it can also cause problems in the mother and her baby.  If you are pregnant and you have symptoms of an anxiety disorder, it is important to talk with your health care provider. What are the causes? The exact cause of this condition is not known. Hormonal changes during and after pregnancy may  play a role in causing perinatal anxiety. What increases the risk? You are more likely to develop this condition if:  You have a personal or family history of depression, anxiety, or mood disorders.  You experience a stressful life event during pregnancy, such as the death of a loved one.  You have a lot of regular life stress, such as being a single parent.  You have thyroid problems. What are the signs or symptoms? Perinatal anxiety can be different for everyone. It may include:  Panic attacks (panic disorder). These are intense episodes of fear or discomfort that may also cause sweating, nausea, shortness of breath, or fear of dying. They usually last 5-15 minutes.  Reliving an upsetting (traumatic) event through distressing thoughts, dreams, or flashbacks (post-traumatic stress disorder, or PTSD).  Excessive worry about multiple problems (generalized anxiety disorder).  Fear and stress about leaving certain people or loved ones (separation anxiety).  Performing repetitive tasks (compulsions) to relieve stress or worry (obsessive compulsive disorder, or OCD).  Fear of certain objects or situations  (phobias).  Excessive worrying, such as a constant feeling that something bad is going to happen.  Inability to relax.  Difficulty concentrating.  Sleep problems.  Frequent nightmares or disturbing thoughts. How is this diagnosed? This condition is diagnosed based on a physical exam and mental evaluation. In some cases, your health care provider may use an anxiety screening tool. These tools include a list of questions that can help a health care provider diagnose anxiety. Your health care provider may refer you to a mental health expert who specializes in anxiety. How is this treated? This condition may be treated with:  Medicines. Your health care provider will only give you medicines that have been proven safe for pregnancy and breastfeeding.  Talk therapy with a mental health professional to help change your patterns of thinking (cognitive behavioral therapy).  Mindfulness-based stress reduction.  Other relaxation therapies, such as deep breathing or guided muscle relaxation.  Support groups. Follow these instructions at home: Lifestyle  Do not use any products that contain nicotine or tobacco, such as cigarettes and e-cigarettes. If you need help quitting, ask your health care provider.  Do not use alcohol when you are pregnant. After your baby is born, limit alcohol intake to no more than 1 drink a day. One drink equals 12 oz of beer, 5 oz of wine, or 1 oz of hard liquor.  Consider joining a support group for new mothers. Ask your health care provider for recommendations.  Take good care of yourself. Make sure you: ? Get plenty of sleep. If you are having trouble sleeping, talk with your health care provider. ? Eat a healthy diet. This includes plenty of fruits and vegetables, whole grains, and lean proteins. ? Exercise regularly, as told by your health care provider. Ask your health care provider what exercises are safe for you. General instructions  Take  over-the-counter and prescription medicines only as told by your health care provider.  Talk with your partner or family members about your feelings during pregnancy. Share any concerns or fears that you may have.  Ask for help with tasks or chores when you need it. Ask friends and family members to provide meals, watch your children, or help with cleaning.  Keep all follow-up visits as told by your health care provider. This is important. Contact a health care provider if:  You (or people close to you) notice that you have any symptoms of anxiety or depression.  You  have anxiety and your symptoms get worse.  You experience side effects from medicines, such as nausea or sleep problems. Get help right away if:  You feel like hurting yourself, your baby, or someone else. If you ever feel like you may hurt yourself or others, or have thoughts about taking your own life, get help right away. You can go to your nearest emergency department or call:  Your local emergency services (911 in the [place]  A suicide crisis helpline, such as the National Suicide Prevention Lifeline at 714-533-9757. This is open 24 hours a day. Summary  Perinatal anxiety is when a woman feels excessive tension or worry during pregnancy or during the first 12 months after she gives birth.  Perinatal anxiety may include panic attacks, post-traumatic stress disorder, separation anxiety, phobias, or generalized anxiety.  Perinatal anxiety can cause physical health problems in the mother and baby if not properly managed.  This condition is treated with medicines, talk therapy, stress reduction therapies, or a combination of two or more treatments.  Talk with your partner or family members about your concerns or fears. Do not be afraid to ask for help. This information is not intended to replace advice given to you by your health care provider. Make sure you discuss any questions you have with your health care  provider. Document Released: 08/17/2016 Document Revised: 06/23/2017 Document Reviewed: 08/17/2016 Elsevier Patient Education  2020 [employer]. Preterm Labor and Birth Information  The normal length of a pregnancy is 39-41 weeks. Preterm labor is when labor starts before 37 completed weeks of pregnancy. What are the risk factors for preterm labor? Preterm labor is more likely to occur in women who:  Have certain infections during pregnancy such as a bladder infection, sexually transmitted infection, or infection inside the uterus (chorioamnionitis).  Have a shorter-than-normal cervix.  Have gone into preterm labor before.  Have had surgery on their cervix.  Are younger than age 92 or older than age 57.  Are African American.  Are pregnant with twins or multiple babies (multiple gestation).  Take street drugs or smoke while pregnant.  Do not gain enough weight while pregnant.  Became pregnant shortly after having been pregnant. What are the symptoms of preterm labor? Symptoms of preterm labor include:  Cramps similar to those that can happen during a menstrual period. The cramps may happen with diarrhea.  Pain in the abdomen or lower back.  Regular uterine contractions that may feel like tightening of the abdomen.  A feeling of increased pressure in the pelvis.  Increased watery or bloody mucus discharge from the vagina.  Water breaking (ruptured amniotic sac). Why is it important to recognize signs of preterm labor? It is important to recognize signs of preterm labor because babies who are born prematurely may not be fully developed. This can put them at an increased risk for:  Long-term (chronic) heart and lung problems.  Difficulty immediately after birth with regulating body systems, including blood sugar, body temperature, heart rate, and breathing rate.  Bleeding in the brain.  Cerebral palsy.  Learning difficulties.  Death. These risks are highest for  babies who are born before 34 weeks of pregnancy. How is preterm labor treated? Treatment depends on the length of your pregnancy, your condition, and the health of your baby. It may involve:  Having a stitch (suture) placed in your cervix to prevent your cervix from opening too early (cerclage).  Taking or being given medicines, such as: ? Hormone medicines. These may  be given early in pregnancy to help support the pregnancy. ? Medicine to stop contractions. ? Medicines to help mature the babys lungs. These may be prescribed if the risk of delivery is high. ? Medicines to prevent your baby from developing cerebral palsy. If the labor happens before 34 weeks of pregnancy, you may need to stay in the hospital. What should I do if I think I am in preterm labor? If you think that you are going into preterm labor, call your health care provider right away. How can I prevent preterm labor in future pregnancies? To increase your chance of having a full-term pregnancy:  Do not use any tobacco products, such as cigarettes, chewing tobacco, and e-cigarettes. If you need help quitting, ask your health care provider.  Do not use street drugs or medicines that have not been prescribed to you during your pregnancy.  Talk with your health care provider before taking any herbal supplements, even if you have been taking them regularly.  Make sure you gain a healthy amount of weight during your pregnancy.  Watch for infection. If you think that you might have an infection, get it checked right away.  Make sure to tell your health care provider if you have gone into preterm labor before. This information is not intended to replace advice given to you by your health care provider. Make sure you discuss any questions you have with your health care provider. Document Released: 09/10/2003 Document Revised: 10/12/2018 Document Reviewed: 11/11/2015 Elsevier Patient Education  2020 [employer]

## 2019-05-15 ENCOUNTER — Encounter (HOSPITAL_COMMUNITY): Payer: Self-pay | Admitting: *Deleted

## 2019-05-15 ENCOUNTER — Emergency Department (HOSPITAL_COMMUNITY)
Admission: EM | Admit: 2019-05-15 | Discharge: 2019-05-15 | Disposition: A | Discharge status: Home / Self Care | Payer: Medicaid Other | Attending: Emergency Medicine | Admitting: Emergency Medicine

## 2019-05-15 ENCOUNTER — Other Ambulatory Visit: Payer: Self-pay

## 2019-05-15 DIAGNOSIS — Z5321 Procedure and treatment not carried out due to patient leaving prior to being seen by health care provider: Secondary | ICD-10-CM | POA: Insufficient documentation

## 2019-05-15 DIAGNOSIS — K0889 Other specified disorders of teeth and supporting structures: Secondary | ICD-10-CM | POA: Diagnosis present

## 2019-05-15 NOTE — ED Triage Notes (Signed)
This pt has had a toothache for 33 weeks  She is [redacted] weeks pregnant  And she reports she was told that she could not   Go to a dentist  whebn she was pregnant  edc  Christmas eve

## 2019-05-28 ENCOUNTER — Other Ambulatory Visit: Payer: Self-pay

## 2019-05-28 ENCOUNTER — Ambulatory Visit (INDEPENDENT_AMBULATORY_CARE_PROVIDER_SITE_OTHER): Payer: Medicaid Other | Admitting: Medical

## 2019-05-28 ENCOUNTER — Other Ambulatory Visit (HOSPITAL_COMMUNITY)
Admission: RE | Admit: 2019-05-28 | Discharge: 2019-05-28 | Disposition: A | Discharge status: Home / Self Care | Payer: Medicaid Other | Source: Ambulatory Visit | Attending: Medical | Admitting: Medical

## 2019-05-28 ENCOUNTER — Encounter: Payer: Self-pay | Admitting: Medical

## 2019-05-28 VITALS — BP 114/68 | HR 96 | Wt 163.8 lb

## 2019-05-28 DIAGNOSIS — Z3493 Encounter for supervision of normal pregnancy, unspecified, third trimester: Secondary | ICD-10-CM | POA: Diagnosis not present

## 2019-05-28 DIAGNOSIS — Z148 Genetic carrier of other disease: Secondary | ICD-10-CM

## 2019-05-28 DIAGNOSIS — Z3A35 35 weeks gestation of pregnancy: Secondary | ICD-10-CM

## 2019-05-28 NOTE — Progress Notes (Signed)
   PRENATAL VISIT NOTE  Subjective:  Haley Potts is a 23 y.o. G3P1011 at [redacted]w[redacted]d being seen today for ongoing prenatal care.  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  Patient reports no complaints.  Contractions: Not present. Vag. Bleeding: None.  Movement: Present. Denies leaking of fluid.   The following portions of the patient's history were reviewed and updated as appropriate: allergies, current medications, past family history, past medical history, past social history, past surgical history and problem list.   Objective:   Vitals:   05/28/19 1420  BP: 114/68  Pulse: 96  Weight: 163 lb 12.8 oz (74.3 kg)    Fetal Status: Fetal Heart Rate (bpm): 128 Fundal Height: 36 cm Movement: Present  Presentation: Vertex  General:  Alert, oriented and cooperative. Patient is in no acute distress.  Skin: Skin is warm and dry. No rash noted.   Cardiovascular: Normal heart rate noted  Respiratory: Normal respiratory effort, no problems with respiration noted  Abdomen: Soft, gravid, appropriate for gestational age.  Pain/Pressure: Present     Pelvic: Cervical exam performed Dilation: Closed Effacement (%): 50 Station: -3  Extremities: Normal range of motion.  Edema: Trace  Mental Status: Normal mood and affect. Normal behavior. Normal judgment and thought content.   Assessment and Plan:  Pregnancy: G3P1011 at [redacted]w[redacted]d 1. Encounter for supervision of low-risk pregnancy in third trimester - GC/Chlamydia probe amp ([place])not at [facility] - Culture, beta strep (group b only)  2. 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 Please refer to After Visit Summary for other counseling recommendations.   Return in about 2 weeks (around 06/11/2019) for LOB, Virtual.  No future appointments.  Kerry Hough, PA-C

## 2019-05-28 NOTE — Patient Instructions (Signed)
Fetal Movement Counts Patient Name: ________________________________________________ Patient Due Date: ____________________ What is a fetal movement count?  A fetal movement count is the number of times that you feel your baby move during a certain amount of time. This may also be called a fetal kick count. A fetal movement count is recommended for every pregnant woman. You may be asked to start counting fetal movements as early as week 28 of your pregnancy. Pay attention to when your baby is most active. You may notice your baby's sleep and wake cycles. You may also notice things that make your baby move more. You should do a fetal movement count:  When your baby is normally most active.  At the same time each day. A good time to count movements is while you are resting, after having something to eat and drink. How do I count fetal movements? 1. Find a quiet, comfortable area. Sit, or lie down on your side. 2. Write down the date, the start time and stop time, and the number of movements that you felt between those two times. Take this information with you to your health care visits. 3. For 2 hours, count kicks, flutters, swishes, rolls, and jabs. You should feel at least 10 movements during 2 hours. 4. You may stop counting after you have felt 10 movements. 5. If you do not feel 10 movements in 2 hours, have something to eat and drink. Then, keep resting and counting for 1 hour. If you feel at least 4 movements during that hour, you may stop counting. Contact a health care provider if:  You feel fewer than 4 movements in 2 hours.  Your baby is not moving like he or she usually does. Date: ____________ Start time: ____________ Stop time: ____________ Movements: ____________ Date: ____________ Start time: ____________ Stop time: ____________ Movements: ____________ Date: ____________ Start time: ____________ Stop time: ____________ Movements: ____________ Date: ____________ Start time:  ____________ Stop time: ____________ Movements: ____________ Date: ____________ Start time: ____________ Stop time: ____________ Movements: ____________ Date: ____________ Start time: ____________ Stop time: ____________ Movements: ____________ Date: ____________ Start time: ____________ Stop time: ____________ Movements: ____________ Date: ____________ Start time: ____________ Stop time: ____________ Movements: ____________ Date: ____________ Start time: ____________ Stop time: ____________ Movements: ____________ This information is not intended to replace advice given to you by your health care provider. Make sure you discuss any questions you have with your health care provider. Document Released: 07/20/2006 Document Revised: 07/10/2018 Document Reviewed: 07/30/2015 Elsevier Patient Education  2020 Else[employer] Braxton Hicks Contractions Contractions of the uterus can occur throughout pregnancy, but they are not always a sign that you are in labor. You may have practice contractions called Braxton Hicks contractions. These false labor contractions are sometimes confused with true labor. What are Braxton Hicks contractions? Braxton Hicks contractions are tightening movements that occur in the muscles of the uterus before labor. Unlike true labor contractions, these contractions do not result in opening (dilation) and thinning of the cervix. Toward the end of pregnancy (32-34 weeks), Braxton Hicks contractions can happen more often and may become stronger. These contractions are sometimes difficult to tell apart from true labor because they can be very uncomfortable. You should not feel embarrassed if you go to the hospital with false labor. Sometimes, the only way to tell if you are in true labor is for your health care provider to look for changes in the cervix. The health care provider will do a physical exam and may monitor your contractions. If you   are not in true labor, the exam should show  that your cervix is not dilating and your water has not broken. If there are no other health problems associated with your pregnancy, it is completely safe for you to be sent home with false labor. You may continue to have Braxton Hicks contractions until you go into true labor. How to tell the difference between true labor and false labor True labor  Contractions last 30-70 seconds.  Contractions become very regular.  Discomfort is usually felt in the top of the uterus, and it spreads to the lower abdomen and low back.  Contractions do not go away with walking.  Contractions usually become more intense and increase in frequency.  The cervix dilates and gets thinner. False labor  Contractions are usually shorter and not as strong as true labor contractions.  Contractions are usually irregular.  Contractions are often felt in the front of the lower abdomen and in the groin.  Contractions may go away when you walk around or change positions while lying down.  Contractions get weaker and are shorter-lasting as time goes on.  The cervix usually does not dilate or become thin. Follow these instructions at home:   Take over-the-counter and prescription medicines only as told by your health care provider.  Keep up with your usual exercises and follow other instructions from your health care provider.  Eat and drink lightly if you think you are going into labor.  If Braxton Hicks contractions are making you uncomfortable: ? Change your position from lying down or resting to walking, or change from walking to resting. ? Sit and rest in a tub of warm water. ? Drink enough fluid to keep your urine pale yellow. Dehydration may cause these contractions. ? Do slow and deep breathing several times an hour.  Keep all follow-up prenatal visits as told by your health care provider. This is important. Contact a health care provider if:  You have a fever.  You have continuous pain in  your abdomen. Get help right away if:  Your contractions become stronger, more regular, and closer together.  You have fluid leaking or gushing from your vagina.  You pass blood-tinged mucus (bloody show).  You have bleeding from your vagina.  You have low back pain that you never had before.  You feel your baby's head pushing down and causing pelvic pressure.  Your baby is not moving inside you as much as it used to. Summary  Contractions that occur before labor are called Braxton Hicks contractions, false labor, or practice contractions.  Braxton Hicks contractions are usually shorter, weaker, farther apart, and less regular than true labor contractions. True labor contractions usually become progressively stronger and regular, and they become more frequent.  Manage discomfort from Braxton Hicks contractions by changing position, resting in a warm bath, drinking plenty of water, or practicing deep breathing. This information is not intended to replace advice given to you by your health care provider. Make sure you discuss any questions you have with your health care provider. Document Released: 11/03/2016 Document Revised: 06/02/2017 Document Reviewed: 11/03/2016 Elsevier Patient Education  2020 Elsevier Inc.  

## 2019-05-29 LAB — GC/CHLAMYDIA PROBE AMP (~~LOC~~) NOT AT ARMC
Chlamydia: NEGATIVE
Comment: NEGATIVE
Comment: NORMAL
Neisseria Gonorrhea: NEGATIVE

## 2019-05-31 LAB — CULTURE, BETA STREP (GROUP B ONLY): Strep Gp B Culture: NEGATIVE

## 2019-06-05 ENCOUNTER — Telehealth: Payer: Medicaid Other | Admitting: Advanced Practice Midwife

## 2019-06-12 ENCOUNTER — Encounter: Payer: Medicaid Other | Admitting: Medical

## 2019-06-12 ENCOUNTER — Encounter: Payer: Self-pay | Admitting: Medical

## 2019-06-12 NOTE — Progress Notes (Signed)
3:16p-Called pt for My CHART VISIT no answer, will call back in 10 to 15 min.  3:43p-2nd attempt , still no answer, left VM that will need to reschedule at this time.

## 2019-06-12 NOTE — Patient Instructions (Signed)
Reschedule your appointment as soon as possible  

## 2019-06-17 ENCOUNTER — Inpatient Hospital Stay (EMERGENCY_DEPARTMENT_HOSPITAL)
Admission: AD | Admit: 2019-06-17 | Discharge: 2019-06-17 | Disposition: A | Discharge status: Home / Self Care | Payer: Medicaid Other | Source: Home / Self Care | Attending: Obstetrics and Gynecology | Admitting: Obstetrics and Gynecology

## 2019-06-17 ENCOUNTER — Encounter (HOSPITAL_COMMUNITY): Payer: Self-pay | Admitting: Obstetrics and Gynecology

## 2019-06-17 ENCOUNTER — Other Ambulatory Visit: Payer: Self-pay

## 2019-06-17 DIAGNOSIS — Z3490 Encounter for supervision of normal pregnancy, unspecified, unspecified trimester: Secondary | ICD-10-CM

## 2019-06-17 DIAGNOSIS — O26891 Other specified pregnancy related conditions, first trimester: Secondary | ICD-10-CM

## 2019-06-17 DIAGNOSIS — Z3A38 38 weeks gestation of pregnancy: Secondary | ICD-10-CM | POA: Insufficient documentation

## 2019-06-17 DIAGNOSIS — O471 False labor at or after 37 completed weeks of gestation: Secondary | ICD-10-CM

## 2019-06-17 DIAGNOSIS — Z3493 Encounter for supervision of normal pregnancy, unspecified, third trimester: Secondary | ICD-10-CM

## 2019-06-17 NOTE — MAU Provider Note (Signed)
S: Ms. ADYSSON REVELLE is a 23 y.o. G3P1011 at [redacted]w[redacted]d  who presents to MAU today complaining contractions q 9 minutes since last month. She denies vaginal bleeding. She denies LOF. She reports normal fetal movement.    O: BP 128/70 (BP Location: Right Arm)   Pulse (!) 108   Resp 18   Ht 5\' 4"  (1.626 m)   Wt 75.4 kg   LMP 09/20/2018 (Exact Date)   SpO2 100% Comment: room air  BMI 28.55 kg/m  GENERAL: Well-developed, well-nourished female in no acute distress.  HEAD: Normocephalic, atraumatic.  CHEST: Normal effort of breathing, regular heart rate ABDOMEN: Soft, nontender, gravid  Cervical exam:  Dilation: 1 Effacement (%): 30 Cervical Position: Posterior Station: -3 Presentation: Vertex Exam by:: K.Wilson,RN   Fetal Monitoring: Baseline: 125 Variability: moderate Accelerations: present, 15x15 Decelerations: absent Contractions: irregular, q9 minutes   A: SIUP at [redacted]w[redacted]d  False labor  P: DC to home Return precautions given  Quinci Gavidia L, DO 06/17/2019 3:35 PM

## 2019-06-17 NOTE — MAU Note (Signed)
PT is G3P1 at [redacted]w[redacted]d with contractions since last month. They have gotten much stronger since last night. Denies LOF, VB +FM

## 2019-06-17 NOTE — Discharge Instructions (Signed)

## 2019-06-18 ENCOUNTER — Inpatient Hospital Stay (HOSPITAL_COMMUNITY)
Admission: AD | Admit: 2019-06-18 | Discharge: 2019-06-19 | DRG: 807 | Disposition: A | Discharge status: Home / Self Care | Payer: Medicaid Other | Attending: Obstetrics and Gynecology | Admitting: Obstetrics and Gynecology

## 2019-06-18 ENCOUNTER — Inpatient Hospital Stay (HOSPITAL_COMMUNITY): Payer: Medicaid Other | Admitting: Anesthesiology

## 2019-06-18 ENCOUNTER — Encounter (HOSPITAL_COMMUNITY): Payer: Self-pay | Admitting: Obstetrics and Gynecology

## 2019-06-18 DIAGNOSIS — Z3493 Encounter for supervision of normal pregnancy, unspecified, third trimester: Secondary | ICD-10-CM

## 2019-06-18 DIAGNOSIS — Z20828 Contact with and (suspected) exposure to other viral communicable diseases: Secondary | ICD-10-CM | POA: Diagnosis present

## 2019-06-18 DIAGNOSIS — Z3A38 38 weeks gestation of pregnancy: Secondary | ICD-10-CM | POA: Diagnosis not present

## 2019-06-18 DIAGNOSIS — O26891 Other specified pregnancy related conditions, first trimester: Secondary | ICD-10-CM

## 2019-06-18 DIAGNOSIS — O26893 Other specified pregnancy related conditions, third trimester: Secondary | ICD-10-CM | POA: Diagnosis present

## 2019-06-18 DIAGNOSIS — Z3490 Encounter for supervision of normal pregnancy, unspecified, unspecified trimester: Secondary | ICD-10-CM

## 2019-06-18 DIAGNOSIS — Z148 Genetic carrier of other disease: Secondary | ICD-10-CM

## 2019-06-18 LAB — TYPE AND SCREEN
ABO/RH(D): A POS
Antibody Screen: NEGATIVE

## 2019-06-18 LAB — CBC
HCT: 33.3 % — ABNORMAL LOW (ref 36.0–46.0)
Hemoglobin: 10.4 g/dL — ABNORMAL LOW (ref 12.0–15.0)
MCH: 23.9 pg — ABNORMAL LOW (ref 26.0–34.0)
MCHC: 31.2 g/dL (ref 30.0–36.0)
MCV: 76.6 fL — ABNORMAL LOW (ref 80.0–100.0)
Platelets: 304 10*3/uL (ref 150–400)
RBC: 4.35 MIL/uL (ref 3.87–5.11)
RDW: 15.7 % — ABNORMAL HIGH (ref 11.5–15.5)
WBC: 8 10*3/uL (ref 4.0–10.5)
nRBC: 0 % (ref 0.0–0.2)

## 2019-06-18 LAB — RESPIRATORY PANEL BY RT PCR (FLU A&B, COVID)
Influenza A by PCR: NEGATIVE
Influenza B by PCR: NEGATIVE
SARS Coronavirus 2 by RT PCR: NEGATIVE

## 2019-06-18 LAB — ABO/RH: ABO/RH(D): A POS

## 2019-06-18 LAB — RPR: RPR Ser Ql: NONREACTIVE

## 2019-06-18 MED ORDER — OXYTOCIN 40 UNITS IN NORMAL SALINE INFUSION - SIMPLE MED: 2.5000 [IU]/h | INTRAVENOUS | Status: DC

## 2019-06-18 MED ORDER — IBUPROFEN 600 MG PO TABS
600.0000 mg | ORAL_TABLET | Freq: Four times a day (QID) | ORAL | Status: DC
  Administered 2019-06-18 – 2019-06-19 (×5): 600 mg via ORAL
  Filled 2019-06-18 (×5): qty 1

## 2019-06-18 MED ORDER — COCONUT OIL OIL: 1.0000 "application " | TOPICAL_OIL | Status: DC | PRN

## 2019-06-18 MED ORDER — OXYCODONE-ACETAMINOPHEN 5-325 MG PO TABS: 1.0000 | ORAL_TABLET | ORAL | Status: DC | PRN

## 2019-06-18 MED ORDER — DIPHENHYDRAMINE HCL 50 MG/ML IJ SOLN: 12.5000 mg | INTRAMUSCULAR | Status: DC | PRN

## 2019-06-18 MED ORDER — ONDANSETRON HCL 4 MG/2ML IJ SOLN: 4.0000 mg | Freq: Four times a day (QID) | INTRAMUSCULAR | Status: DC | PRN

## 2019-06-18 MED ORDER — LACTATED RINGERS IV SOLN: 500.0000 mL | Freq: Once | INTRAVENOUS | Status: DC

## 2019-06-18 MED ORDER — OXYTOCIN 40 UNITS IN NORMAL SALINE INFUSION - SIMPLE MED
INTRAVENOUS | Status: AC
  Administered 2019-06-18: 500 mL via INTRAVENOUS
  Filled 2019-06-18: qty 1000

## 2019-06-18 MED ORDER — FLEET ENEMA 7-19 GM/118ML RE ENEM: 1.0000 | ENEMA | RECTAL | Status: DC | PRN

## 2019-06-18 MED ORDER — OXYCODONE-ACETAMINOPHEN 5-325 MG PO TABS: 2.0000 | ORAL_TABLET | ORAL | Status: DC | PRN

## 2019-06-18 MED ORDER — LIDOCAINE HCL (PF) 1 % IJ SOLN
INTRAMUSCULAR | Status: DC | PRN
  Administered 2019-06-18 (×2): 4 mL via EPIDURAL

## 2019-06-18 MED ORDER — DIPHENHYDRAMINE HCL 25 MG PO CAPS: 25.0000 mg | ORAL_CAPSULE | Freq: Four times a day (QID) | ORAL | Status: DC | PRN

## 2019-06-18 MED ORDER — SIMETHICONE 80 MG PO CHEW: 80.0000 mg | CHEWABLE_TABLET | ORAL | Status: DC | PRN

## 2019-06-18 MED ORDER — MAGNESIUM HYDROXIDE 400 MG/5ML PO SUSP: 30.0000 mL | ORAL | Status: DC | PRN

## 2019-06-18 MED ORDER — LACTATED RINGERS IV SOLN: INTRAVENOUS | Status: DC

## 2019-06-18 MED ORDER — EPHEDRINE 5 MG/ML INJ: 10.0000 mg | INTRAVENOUS | Status: DC | PRN

## 2019-06-18 MED ORDER — ACETAMINOPHEN 325 MG PO TABS: 650.0000 mg | ORAL_TABLET | ORAL | Status: DC | PRN

## 2019-06-18 MED ORDER — WITCH HAZEL-GLYCERIN EX PADS: 1.0000 "application " | MEDICATED_PAD | CUTANEOUS | Status: DC | PRN

## 2019-06-18 MED ORDER — OXYCODONE HCL 5 MG PO TABS: 10.0000 mg | ORAL_TABLET | ORAL | Status: DC | PRN

## 2019-06-18 MED ORDER — SOD CITRATE-CITRIC ACID 500-334 MG/5ML PO SOLN: 30.0000 mL | ORAL | Status: DC | PRN

## 2019-06-18 MED ORDER — PHENYLEPHRINE 40 MCG/ML (10ML) SYRINGE FOR IV PUSH (FOR BLOOD PRESSURE SUPPORT): 80.0000 ug | PREFILLED_SYRINGE | INTRAVENOUS | Status: DC | PRN

## 2019-06-18 MED ORDER — LACTATED RINGERS IV SOLN: 500.0000 mL | INTRAVENOUS | Status: DC | PRN

## 2019-06-18 MED ORDER — PRENATAL MULTIVITAMIN CH
1.0000 | ORAL_TABLET | Freq: Every day | ORAL | Status: DC
  Administered 2019-06-18 – 2019-06-19 (×2): 1 via ORAL
  Filled 2019-06-18 (×2): qty 1

## 2019-06-18 MED ORDER — DIBUCAINE (PERIANAL) 1 % EX OINT: 1.0000 "application " | TOPICAL_OINTMENT | CUTANEOUS | Status: DC | PRN

## 2019-06-18 MED ORDER — OXYCODONE HCL 5 MG PO TABS: 5.0000 mg | ORAL_TABLET | ORAL | Status: DC | PRN

## 2019-06-18 MED ORDER — FENTANYL-BUPIVACAINE-NACL 0.5-0.125-0.9 MG/250ML-% EP SOLN
12.0000 mL/h | EPIDURAL | Status: DC | PRN
  Filled 2019-06-18: qty 250

## 2019-06-18 MED ORDER — SENNOSIDES-DOCUSATE SODIUM 8.6-50 MG PO TABS
2.0000 | ORAL_TABLET | ORAL | Status: DC
  Administered 2019-06-18: 2 via ORAL
  Filled 2019-06-18: qty 2

## 2019-06-18 MED ORDER — BENZOCAINE-MENTHOL 20-0.5 % EX AERO: 1.0000 "application " | INHALATION_SPRAY | CUTANEOUS | Status: DC | PRN

## 2019-06-18 MED ORDER — ONDANSETRON HCL 4 MG PO TABS: 4.0000 mg | ORAL_TABLET | ORAL | Status: DC | PRN

## 2019-06-18 MED ORDER — LIDOCAINE HCL (PF) 1 % IJ SOLN: 30.0000 mL | INTRAMUSCULAR | Status: DC | PRN

## 2019-06-18 MED ORDER — ONDANSETRON HCL 4 MG/2ML IJ SOLN: 4.0000 mg | INTRAMUSCULAR | Status: DC | PRN

## 2019-06-18 MED ORDER — OXYTOCIN BOLUS FROM INFUSION: 500.0000 mL | Freq: Once | INTRAVENOUS | Status: AC

## 2019-06-18 NOTE — Anesthesia Postprocedure Evaluation (Signed)
Anesthesia Post Note  Patient: Haley Potts  Procedure(s) Performed: AN AD [place]     Patient location during evaluation: Mother Baby Anesthesia Type: Epidural Level of consciousness: awake and alert Pain management: pain level controlled Vital Signs Assessment: post-procedure vital signs reviewed and stable Respiratory status: spontaneous breathing, nonlabored ventilation and respiratory function stable Cardiovascular status: stable Postop Assessment: no headache, no backache, epidural receding, no apparent nausea or vomiting, patient able to bend at knees, adequate PO intake and able to ambulate Anesthetic complications: no    Last Vitals:  Vitals:   06/18/19 0954 06/18/19 1049  BP: 121/75 118/69  Pulse: (!) 101 90  Resp: 18   Temp: 37.2 C 37.1 C  SpO2: 100%     Last Pain:  Vitals:   06/18/19 1049  TempSrc: Oral  PainSc: 4    Pain Goal:                   [employer]

## 2019-06-18 NOTE — H&P (Signed)
LABOR AND DELIVERY ADMISSION HISTORY AND PHYSICAL NOTE  Haley Potts is a 23 y.o. female G71P1011 with IUP at 56w5dby 9wk UKoreapresenting for active labor.   She reports positive fetal movement. She denies leakage of fluid or vaginal bleeding.   She plans on breast feeding. She declines birth control.  [facility], normal anatomy, breech presentation, posterior placenta, 53%ile, EFW 316 g  Pregnancy complications:  - possible SMA carrier, declined amnio  Past Medical History: Past Medical History:  Diagnosis Date  . Depression    Post partum   * lasted 4 months    Past Surgical History: Past Surgical History:  Procedure Laterality Date  . WISDOM TOOTH EXTRACTION      Obstetrical History: OB History    Gravida  3   Para  1   Term  1   Preterm  0   AB  1   Living  1     SAB  1   TAB  0   Ectopic  0   Multiple  0   Live Births  1           Social History: Social History   Socioeconomic History  . Marital status: Single    Spouse name: Not on file  . Number of children: Not on file  . Years of education: Not on file  . Highest education level: Not on file  Occupational History  . Not on file  Tobacco Use  . Smoking status: Never Smoker  . Smokeless tobacco: Never Used  Substance and Sexual Activity  . Alcohol use: No    Alcohol/week: 0.0 standard drinks  . Drug use: No  . Sexual activity: Yes    Partners: Male    Birth control/protection: None  Other Topics Concern  . Not on file  Social History Narrative  . Not on file   Social Determinants of Health   Financial Resource Strain:   . Difficulty of Paying Living Expenses: Not on file  Food Insecurity: No Food Insecurity  . Worried About RCharity fundraiserin the Last Year: Never true  . Ran Out of Food in the Last Year: Never true  Transportation Needs: No Transportation Needs  . Lack of Transportation (Medical): No  . Lack of  Transportation (Non-Medical): No  Physical Activity:   . Days of Exercise per Week: Not on file  . Minutes of Exercise per Session: Not on file  Stress:   . Feeling of Stress : Not on file  Social Connections:   . Frequency of Communication with Friends and Family: Not on file  . Frequency of Social Gatherings with Friends and Family: Not on file  . Attends Religious Services: Not on file  . Active Member of Clubs or Organizations: Not on file  . Attends CArchivistMeetings: Not on file  . Marital Status: Not on file    Family History: History reviewed. No pertinent family history.  Allergies: No Known Allergies  Medications Prior to Admission  Medication Sig Dispense Refill Last Dose  . AMBULATORY NON FORMULARY MEDICATION 1 Device by Other route once a week. Blood pressure cuff/Monitored regularly at home.  ICD 10: Z34.90 1 kit 0   . ferrous sulfate 325 (65 FE) MG EC tablet Take 1 tablet (325 mg total) by mouth every other day. 30 tablet 1   . Prenatal Vit-Fe Fumarate-FA (PRENATAL MULTIVITAMIN) TABS tablet Take 1 tablet by mouth  daily at 12 noon.        Review of Systems  All systems reviewed and negative except as stated in HPI  Physical Exam Blood pressure 129/78, pulse (!) 124, temperature 98.8 F (37.1 C), temperature source Oral, resp. rate 19, last menstrual period 09/20/2018. General appearance: alert, oriented, NAD Lungs: normal respiratory effort Heart: regular rate Abdomen: soft, non-tender; gravid, leopolds 3200 g Extremities: No calf swelling or tenderness Presentation: cephalic by RN SVE Fetal monitoringBaseline: 130  bpm, Variability: Good {> 6 bpm), Accelerations: Reactive and Decelerations: Absent Uterine activityFrequency: Every 3-4 minutes    Prenatal labs: ABO, Rh: A/Positive/-- (06/09 1514) Antibody: Negative (06/09 1514) Rubella: 14.20 (06/09 1514) RPR: Non Reactive (09/29 0858)  HBsAg: Negative (06/09 1514)  HIV: Non Reactive (09/29  0858)  GC/Chlamydia: neg/neg 05/28/2019  GBS: Negative/-- (11/24 1614)  2-hr GTT: normal 04/02/2019 Genetic screening:  Low risk panorama Anatomy US: normal  Prenatal Transfer Tool  Maternal Diabetes: No Genetic Screening: Normal Maternal Ultrasounds/Referrals: Isolated EIF (echogenic intracardiac focus) Fetal Ultrasounds or other Referrals:  None Maternal Substance Abuse:  No Significant Maternal Medications:  None Significant Maternal Lab Results: Group B Strep negative  No results found for this or any previous visit (from the past 24 hour(s)).  Patient Active Problem List   Diagnosis Date Noted  . Normal labor 06/18/2019  . 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 Supervision of low-risk pregnancy 12/07/2018  . Intrauterine pregnancy 11/18/2018  . Abdominal pain during pregnancy in first trimester 11/18/2018    Assessment: Haley Potts is a 23 y.o. G3P1011 at 92w5dhere for active labor.  #Labor: Expectant management #Pain: Epidural if possible #FWB: Cat I #GBS/ID: negative #COVID: swab pending #MOF: Breast #MOC: Declines #Circ:  declines  MClarnce Flock12/15/2020, 7:08 AM

## 2019-06-18 NOTE — MAU Note (Signed)
Patient presents to MAU c/o ctx. +FM, denies LOF. Reports small vaginal bleeding

## 2019-06-18 NOTE — Anesthesia Preprocedure Evaluation (Signed)
Anesthesia Evaluation  Patient identified by MRN, date of birth, ID band Patient awake    Reviewed: Allergy & Precautions, H&P , Patient's Chart, lab work & pertinent test results  Airway Mallampati: II  TM Distance: >3 FB Neck ROM: full    Dental no notable dental hx. (+) Teeth Intact   Pulmonary neg pulmonary ROS,    Pulmonary exam normal breath sounds clear to auscultation       Cardiovascular negative cardio ROS Normal cardiovascular exam Rhythm:regular Rate:Normal     Neuro/Psych PSYCHIATRIC DISORDERS Depression negative neurological ROS  negative psych ROS   GI/Hepatic negative GI ROS, Neg liver ROS,   Endo/Other  negative endocrine ROS  Renal/GU negative Renal ROS  negative genitourinary   Musculoskeletal   Abdominal   Peds  Hematology negative hematology ROS (+)   Anesthesia Other Findings   Reproductive/Obstetrics (+) Pregnancy                             Anesthesia Physical Anesthesia Plan  ASA: II  Anesthesia Plan: Epidural   Post-op Pain Management:    Induction:   PONV Risk Score and Plan:   Airway Management Planned:   Additional Equipment:   Intra-op Plan:   Post-operative Plan:   Informed Consent: I have reviewed the patients History and Physical, chart, labs and discussed the procedure including the risks, benefits and alternatives for the proposed anesthesia with the patient or authorized representative who has indicated his/her understanding and acceptance.       Plan Discussed with: Anesthesiologist  Anesthesia Plan Comments:         Anesthesia Quick Evaluation

## 2019-06-18 NOTE — Anesthesia Procedure Notes (Signed)
Epidural Patient location during procedure: OB Start time: 06/18/2019 7:43 AM End time: 06/18/2019 7:51 AM  Staffing Anesthesiologist: Josephine Igo, MD Performed: anesthesiologist   Preanesthetic Checklist Completed: patient identified, IV checked, site marked, risks and benefits discussed, surgical consent, monitors and equipment checked, pre-op evaluation and timeout performed  Epidural Patient position: sitting Prep: DuraPrep and site prepped and draped Patient monitoring: continuous pulse ox and blood pressure Approach: midline Location: L3-L4 Injection technique: LOR air  Needle:  Needle type: Tuohy  Needle gauge: 17 G Needle length: 9 cm and 9 Needle insertion depth: 5 cm cm Catheter type: closed end flexible Catheter size: 19 Gauge Catheter at skin depth: 10 cm Test dose: negative and Other  Assessment Events: blood not aspirated, injection not painful, no injection resistance, no paresthesia and negative IV test  Additional Notes Patient identified. Risks and benefits discussed including failed block, incomplete  Pain control, post dural puncture headache, nerve damage, paralysis, blood pressure Changes, nausea, vomiting, reactions to medications-both toxic and allergic and post Partum back pain. All questions were answered. Patient expressed understanding and wished to proceed. Sterile technique was used throughout procedure. Epidural site was Dressed with sterile barrier dressing. No paresthesias, signs of intravascular injection Or signs of intrathecal spread were encountered.  Patient felt urge to push after 1st test dose. Please see RN's note for documentation of vital signs and FHR which are stable. Reason for block:procedure for pain

## 2019-06-18 NOTE — Discharge Summary (Signed)
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 yo G3P1011 at 337w5das admitted in Active Labor on 06/18/2019. Patient had an uncomplicated labor course as follows: arrived at 8 cm, had SROM after arrival to the floor and shortly after delivered. Membrane Rupture Time/Date: 7:47 AM ,06/18/2019   Intrapartum Procedures: Episiotomy: None [1]                                         Lacerations:  None [1]  Patient had a delivery of a Viable infant. 06/18/2019  Information for the patient's newborn:  GoDallie, Patton0[[id number]]Delivery Method: Vag-Spont(filed from delivery)     Pateint had an uncomplicated postpartum course.  She is ambulating, tolerating a regular diet, passing flatus, and urinating well. Declined birth control. Patient is discharged home in stable condition on 06/19/19.  Delivery time: 7:53 AM    Magnesium Sulfate received: No BMZ received: No Rhophylac:N/A MMR:N/A Transfusion:No  Physical exam  Vitals:   06/18/19 1522 06/18/19 1915 06/18/19 2324 06/19/19 0500  BP: 105/64 112/67 110/72 124/77  Pulse: 66 87 88 75  Resp: _0 Temp: 99.2 F (37.3 C) 98.4 F (36.9 C) 98.1 F (36.7 C) 98.3 F (36.8 C)  TempSrc: Oral Oral Oral Oral  SpO2:  100%     General: alert, cooperative and no  distress Lochia: appropriate Uterine Fundus: firm DVT Evaluation: No evidence of DVT seen on physical exam. Labs: Lab Results  Component Value Date   WBC 8.0 06/18/2019   HGB 10.4 (L) 06/18/2019   HCT 33.3 (L) 06/18/2019   MCV 76.6 (L) 06/18/2019   PLT 304 06/18/2019   CMP Latest Ref Rng & Units 03/23/2018  Glucose 70 - 99 mg/dL 84  BUN 6 - 20 mg/dL 12  Creatinine 0.44 - 1.00 mg/dL 0.74  Sodium 135 - 145 mmol/L 141  Potassium 3.5 - 5.1 mmol/L 3.6  Chloride 98 - 111 mmol/L 107  CO2 22 - 32 mmol/L 27  Calcium 8.9 - 10.3 mg/dL 9.1  Total Protein 6.5 - 8.1 g/dL 8.0  Total Bilirubin 0.3 - 1.2 mg/dL 0.6  Alkaline Phos 38 - 126 U/L 57  AST 15 - 41 U/L 19  ALT 0 - 44 U/L 14    Discharge  instruction: per After Visit Summary and "Baby and Me Booklet".  After visit meds:  Allergies as of 06/19/2019   No Known Allergies     Medication List    STOP taking these medications   AMBULATORY NON FORMULARY MEDICATION     TAKE these medications   acetaminophen 325 MG tablet Commonly known as: Tylenol Take 2 tablets (650 mg total) by mouth every 6 (six) hours as needed (for pain scale < 4).   ferrous sulfate 325 (65 FE) MG EC tablet Take 1 tablet (325 mg total) by mouth every other day.   ibuprofen 600 MG tablet Commonly known as: ADVIL Take 1 tablet (600 mg total) by mouth every 6 (six) hours as needed.   prenatal multivitamin Tabs tablet Take 1 tablet by mouth daily at 12 noon.       Diet: routine diet  Activity: Advance as tolerated. Pelvic rest for 6 weeks.   Outpatient follow up:6 weeks Follow up Appt: Future Appointments  Date Time Provider [place]  07/16/2019  3:35 PM Starr Lake, CNM [facility]   Follow up Visit:   Please schedule this patient for Postpartum visit in: 6 weeks with the following provider: Any provider For C/S patients schedule nurse incision check in weeks 2 weeks: no Low risk pregnancy complicated by: n/a Delivery  mode:  SVD Anticipated Birth Control:  other/unsure PP Procedures needed: none  Schedule Integrated BH visit: no    Newborn Data: Live born female  Birth Weight:  3351g APGAR: 8, 9  Newborn Delivery   Birth date/time: 06/18/2019 07:53:00 Delivery type:       Baby Feeding: Breast Disposition:home with mother   06/19/2019 Chauncey Mann, MD

## 2019-06-19 MED ORDER — ACETAMINOPHEN 325 MG PO TABS: 650.0000 mg | ORAL_TABLET | Freq: Four times a day (QID) | ORAL | 0 refills | Status: DC | PRN

## 2019-06-19 MED ORDER — IBUPROFEN 600 MG PO TABS: 600.0000 mg | ORAL_TABLET | Freq: Four times a day (QID) | ORAL | 0 refills | Status: DC | PRN

## 2019-06-19 NOTE — Progress Notes (Signed)
CSW received consult for history of depression.  CSW met with MOB to offer support and complete assessment.    MOB sitting up in bed holding infant with FOB present at bedside, when CSW entered the room. CSW introduced self and received verbal permission to complete assessment with FOB present. MOB and FOB both welcoming of CSW visit and were engaged throughout assessment. 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 MOB unable to remember all of her symptoms or how long they lasted but did note that she isolated from the people around her and didn't really want to talk to anyone. CSW aware notes indicate PPD may have lasted about 4 months. MOB reported she participated in counseling and was prescribed a medication but only took it for one day as she did not like the way it made her feel. MOB stated she focused mainly on coping skills such as painting and arts and crafts. MOB shared this time feels different and that she feels good right now. CSW provided education regarding the baby blues period vs. perinatal mood disorders. CSW recommended self-evaluation during the postpartum time period using the New Mom Checklist from Postpartum Progress and encouraged MOB to contact a medical professional if symptoms are noted at any time. MOB did not appear to be displaying any acute mental health symptoms and denied any current SI or HI. MOB reported feeling well supported by FOB and her mother.  MOB and FOB confirmed having all essential items for infant once home and reported infant would be sleeping in a bassinet once home. CSW provided review of Sudden Infant Death Syndrome (SIDS) precautions and safe sleeping habits.    CSW identifies no further need for intervention and no barriers to discharge at this time.  Elijio Miles, LCSW [facility] [employer] [phone]

## 2019-06-19 NOTE — Progress Notes (Signed)
Post Partum Day 1 Subjective: Patient reports feeling well. She is tolerating PO. Ambulating and urinating without difficulty. Lochia minimal. Desires to DC today if baby can.  Objective: Blood pressure 110/72, pulse 88, temperature 98.1 F (36.7 C), temperature source Oral, resp. rate 17, last menstrual period 09/20/2018, SpO2 100 %, unknown if currently breastfeeding.  Physical Exam:  General: alert, cooperative, appears stated age and no distress Lochia: appropriate Uterine Fundus: firm DVT Evaluation: No evidence of DVT seen on physical exam.  Recent Labs    06/18/19 0711  HGB 10.4*  HCT 33.3*    Assessment/Plan: Plan for DC today if baby can; otherwise tomorrow Declines birth control Vitals stable Declines circumcision Breastfeeding   LOS: 1 day   Chauncey Mann 06/19/2019, 4:39 AM

## 2019-06-26 ENCOUNTER — Encounter: Payer: Medicaid Other | Admitting: Medical

## 2019-07-16 ENCOUNTER — Ambulatory Visit: Payer: Medicaid Other | Admitting: Student

## 2019-07-16 ENCOUNTER — Other Ambulatory Visit: Payer: Self-pay

## 2019-07-16 DIAGNOSIS — Z91199 Patient's noncompliance with other medical treatment and regimen due to unspecified reason: Secondary | ICD-10-CM

## 2019-07-16 DIAGNOSIS — Z5329 Procedure and treatment not carried out because of patient's decision for other reasons: Secondary | ICD-10-CM

## 2019-07-16 NOTE — Progress Notes (Signed)
Patient did not keep appt. Message sent to reschedule.

## 2019-09-30 ENCOUNTER — Encounter (HOSPITAL_COMMUNITY): Payer: Self-pay | Admitting: Obstetrics and Gynecology

## 2019-09-30 ENCOUNTER — Other Ambulatory Visit: Payer: Self-pay

## 2019-09-30 ENCOUNTER — Inpatient Hospital Stay (HOSPITAL_COMMUNITY)
Admission: AD | Admit: 2019-09-30 | Discharge: 2019-09-30 | Disposition: A | Discharge status: Home / Self Care | Payer: Medicaid Other | Attending: Obstetrics and Gynecology | Admitting: Obstetrics and Gynecology

## 2019-09-30 DIAGNOSIS — Z3A01 Less than 8 weeks gestation of pregnancy: Secondary | ICD-10-CM

## 2019-09-30 DIAGNOSIS — R110 Nausea: Secondary | ICD-10-CM

## 2019-09-30 DIAGNOSIS — O99891 Other specified diseases and conditions complicating pregnancy: Secondary | ICD-10-CM

## 2019-09-30 DIAGNOSIS — O210 Mild hyperemesis gravidarum: Secondary | ICD-10-CM | POA: Insufficient documentation

## 2019-09-30 LAB — URINALYSIS, ROUTINE W REFLEX MICROSCOPIC
Bilirubin Urine: NEGATIVE
Glucose, UA: NEGATIVE mg/dL
Hgb urine dipstick: NEGATIVE
Ketones, ur: NEGATIVE mg/dL
Leukocytes,Ua: NEGATIVE
Nitrite: NEGATIVE
Protein, ur: NEGATIVE mg/dL
Specific Gravity, Urine: 1.01 (ref 1.005–1.030)
pH: 6 (ref 5.0–8.0)

## 2019-09-30 LAB — POCT PREGNANCY, URINE: Preg Test, Ur: POSITIVE — AB

## 2019-09-30 MED ORDER — DOXYLAMINE-PYRIDOXINE 10-10 MG PO TBEC: 2.0000 | DELAYED_RELEASE_TABLET | Freq: Every day | ORAL | 0 refills | Status: AC

## 2019-09-30 NOTE — MAU Provider Note (Signed)
First Provider Initiated Contact with Patient 09/30/19 1638      S Ms. Haley Potts is a 24 y.o. [phone] @[redacted]w[redacted]d  pregnant female who presents to MAU today with complaint of nausea without vomiting. Pt denies pain or bleeding or other symptoms.  O Pulse 78   Temp 98.9 F (37.2 C)   Ht 5\' 4"  (1.626 m)   Wt 72.6 kg   LMP 08/25/2019   SpO2 97%   BMI 27.46 kg/m    Patient Vitals for the past 24 hrs:  Temp Pulse SpO2 Height Weight  09/30/19 1632 98.9 F (37.2 C) 78 97 % 5\' 4"  (1.626 m) 72.6 kg   Physical Exam  Constitutional: She is oriented to person, place, and time. She appears well-developed and well-nourished. No distress.  HENT:  Head: Normocephalic and atraumatic.  Respiratory: Effort normal.  Neurological: She is alert and oriented to person, place, and time.  Skin: She is not diaphoretic.  Psychiatric: She has a normal mood and affect. Her behavior is normal. Judgment and thought content normal.   A Pregnant female Medical screening exam complete  P Discharge from MAU in stable condition RX Diclegis Warning signs for worsening condition that would warrant emergency follow-up discussed Pt to schedule NOB visit at office of choice Patient may return to MAU as needed for pregnancy related complaints  Kyleigha Markert, 08/27/2019, NP 09/30/2019 4:53 PM

## 2019-09-30 NOTE — MAU Note (Signed)
.   Haley Potts is a 24 y.o. at [redacted]w[redacted]d here in MAU reporting: nausea that started a couple of weeks ago. Pt denies any pain or vaginal bleeding LMP: 08/25/19 Onset of complaint: 2 weeks Pain score:0 Vitals:   09/30/19 1632  Pulse: 78  Temp: 98.9 F (37.2 C)  SpO2: 97%     FHT: Lab orders placed from triage: UA/UPT

## 2019-10-06 ENCOUNTER — Encounter (HOSPITAL_COMMUNITY): Payer: Self-pay

## 2019-10-06 ENCOUNTER — Other Ambulatory Visit: Payer: Self-pay

## 2019-10-06 ENCOUNTER — Emergency Department (HOSPITAL_COMMUNITY)
Admission: EM | Admit: 2019-10-06 | Discharge: 2019-10-07 | Disposition: A | Discharge status: Home / Self Care | Payer: Medicaid Other | Attending: Emergency Medicine | Admitting: Emergency Medicine

## 2019-10-06 DIAGNOSIS — K0889 Other specified disorders of teeth and supporting structures: Secondary | ICD-10-CM | POA: Insufficient documentation

## 2019-10-06 DIAGNOSIS — Z5321 Procedure and treatment not carried out due to patient leaving prior to being seen by health care provider: Secondary | ICD-10-CM | POA: Insufficient documentation

## 2019-10-06 NOTE — ED Triage Notes (Signed)
Onset 1 week left upper back molar pain.  Has been taking Tylenol for pain.  Took tylenol 30 min PTA and pain is decreasing.

## 2019-10-07 NOTE — ED Notes (Signed)
Pt called x3 with no response  

## 2019-10-14 ENCOUNTER — Other Ambulatory Visit: Payer: Self-pay

## 2019-10-14 ENCOUNTER — Encounter (HOSPITAL_COMMUNITY): Payer: Self-pay | Admitting: Obstetrics & Gynecology

## 2019-10-14 ENCOUNTER — Inpatient Hospital Stay (HOSPITAL_COMMUNITY)
Admission: EM | Admit: 2019-10-14 | Discharge: 2019-10-14 | Disposition: A | Discharge status: Home / Self Care | Payer: Medicaid Other | Attending: Obstetrics & Gynecology | Admitting: Obstetrics & Gynecology

## 2019-10-14 ENCOUNTER — Inpatient Hospital Stay (HOSPITAL_COMMUNITY): Payer: Medicaid Other

## 2019-10-14 DIAGNOSIS — R109 Unspecified abdominal pain: Secondary | ICD-10-CM | POA: Diagnosis not present

## 2019-10-14 DIAGNOSIS — N760 Acute vaginitis: Secondary | ICD-10-CM

## 2019-10-14 DIAGNOSIS — Z3A01 Less than 8 weeks gestation of pregnancy: Secondary | ICD-10-CM | POA: Diagnosis not present

## 2019-10-14 DIAGNOSIS — O26899 Other specified pregnancy related conditions, unspecified trimester: Secondary | ICD-10-CM

## 2019-10-14 DIAGNOSIS — B9689 Other specified bacterial agents as the cause of diseases classified elsewhere: Secondary | ICD-10-CM

## 2019-10-14 DIAGNOSIS — O30041 Twin pregnancy, dichorionic/diamniotic, first trimester: Secondary | ICD-10-CM | POA: Insufficient documentation

## 2019-10-14 DIAGNOSIS — O23591 Infection of other part of genital tract in pregnancy, first trimester: Secondary | ICD-10-CM | POA: Diagnosis not present

## 2019-10-14 DIAGNOSIS — O26891 Other specified pregnancy related conditions, first trimester: Secondary | ICD-10-CM

## 2019-10-14 LAB — URINALYSIS, ROUTINE W REFLEX MICROSCOPIC
Bilirubin Urine: NEGATIVE
Glucose, UA: NEGATIVE mg/dL
Ketones, ur: NEGATIVE mg/dL
Leukocytes,Ua: NEGATIVE
Nitrite: NEGATIVE
Protein, ur: NEGATIVE mg/dL
Specific Gravity, Urine: 1.017 (ref 1.005–1.030)
pH: 6 (ref 5.0–8.0)

## 2019-10-14 LAB — CBC
HCT: 38.4 % (ref 36.0–46.0)
Hemoglobin: 12.5 g/dL (ref 12.0–15.0)
MCH: 27.1 pg (ref 26.0–34.0)
MCHC: 32.6 g/dL (ref 30.0–36.0)
MCV: 83.1 fL (ref 80.0–100.0)
Platelets: 305 10*3/uL (ref 150–400)
RBC: 4.62 MIL/uL (ref 3.87–5.11)
RDW: 14.1 % (ref 11.5–15.5)
WBC: 7.8 10*3/uL (ref 4.0–10.5)
nRBC: 0 % (ref 0.0–0.2)

## 2019-10-14 LAB — WET PREP, GENITAL
Sperm: NONE SEEN
Trich, Wet Prep: NONE SEEN
Yeast Wet Prep HPF POC: NONE SEEN

## 2019-10-14 LAB — HCG, QUANTITATIVE, PREGNANCY: hCG, Beta Chain, Quant, S: 201086 m[IU]/mL — ABNORMAL HIGH (ref <5)

## 2019-10-14 MED ORDER — METRONIDAZOLE 500 MG PO TABS: 500.0000 mg | ORAL_TABLET | Freq: Two times a day (BID) | ORAL | 0 refills | Status: DC

## 2019-10-14 NOTE — Discharge Instructions (Signed)
Prenatal Care Providers           [facility]   Phone: [phone]  [facility]   Phone: [phone]  [facility]       Phone: [phone]            [facility] [place]     Phone: [phone]          [facility] [place]   Phone: [phone]  [facility]  Phone: [phone]  [facility] Phone: [phone]     [facility]  Phone: [phone]  [facility]  Phone: [phone]  [facility] Phone: [phone]  [facility] Phone: [phone]  [facility] Phone: [phone]  [facility] Phone: [phone]  [facility]  Phone: [phone] [facility] Prenatal Care Providers          [facility] locations:  Hours may vary. Please call for an appointment  [facility] [place]  [address]  [phone]  [facility]   [address]  [phone]  [facility]       [address] [phone]            [facility] [place]     [address] [phone]          [facility] [place]   [address] [phone]  [facility]  [address] [phone]     [facility] ([place])  [address]   [phone]  Safe Medications in Pregnancy   Acne: Benzoyl Peroxide Salicylic Acid  Backache/Headache: Tylenol: 2 regular strength every 4 hours OR              2 Extra strength every 6 hours  Colds/Coughs/Allergies: Benadryl (alcohol free) 25 mg every 6 hours as needed Breath  right strips Claritin Cepacol throat lozenges Chloraseptic throat spray Cold-Eeze- up to three times per day Cough drops, alcohol free Flonase (by prescription only) Guaifenesin Mucinex Robitussin DM (plain only, alcohol free) Saline nasal spray/drops Sudafed (pseudoephedrine) & Actifed ** use only after [redacted] weeks gestation and if you do not have high blood pressure Tylenol Vicks Vaporub Zinc lozenges Zyrtec   Constipation: Colace Ducolax suppositories Fleet enema Glycerin suppositories Metamucil Milk of magnesia Miralax Senokot Smooth move tea  Diarrhea: Kaopectate Imodium A-D  *NO pepto Bismol  Hemorrhoids: Anusol Anusol HC Preparation H Tucks  Indigestion: Tums Maalox Mylanta Zantac  Pepcid  Insomnia: Benadryl (alcohol free) 25mg every 6 hours as needed Tylenol PM Unisom, no Gelcaps  Leg Cramps: Tums MagGel  Nausea/Vomiting:  Bonine Dramamine Emetrol Ginger extract Sea bands Meclizine  Nausea medication to take during pregnancy:  Unisom (doxylamine succinate 25 mg tablets) Take one tablet daily at bedtime. If symptoms are not adequately controlled, the dose can be increased to a maximum recommended dose of two tablets daily (1/2 tablet in the morning, 1/2 tablet mid-afternoon and one at bedtime). Vitamin B6 100mg tablets. Take one tablet twice a day (up to 200 mg per day).  Skin Rashes: Aveeno products Benadryl cream or 25mg every 6 hours   as needed Calamine Lotion 1% cortisone cream  Yeast infection: Gyne-lotrimin 7 Monistat 7   **If taking multiple medications, please check labels to avoid duplicating the same active ingredients **take medication as directed on the label ** Do not exceed 4000 mg of tylenol in 24 hours **Do not take medications that contain aspirin or ibuprofen     

## 2019-10-14 NOTE — MAU Provider Note (Signed)
History     CSN: [id number]  Arrival date and time: 10/14/19 1933   First Provider Initiated Contact with Patient 10/14/19 2037      Chief Complaint  Patient presents with  . Abdominal Pain   HPI Haley Potts is a 24 y.o. [id number] at [redacted]w[redacted]d who presents with right sided abdominal pain and back pain. She states it started tonight and she rates it a 4/10. She has not tried anything for the pain. She reports bleeding 2-3 days ago but none today. Denies any discharge.   OB History    Gravida  4   Para  2   Term  2   Preterm  0   AB  1   Living  2     SAB  1   TAB  0   Ectopic  0   Multiple  0   Live Births  2           Past Medical History:  Diagnosis Date  . Depression    Post partum   * lasted 4 months    Past Surgical History:  Procedure Laterality Date  . WISDOM TOOTH EXTRACTION      No family history on file.  Social History   Tobacco Use  . Smoking status: Never Smoker  . Smokeless tobacco: Never Used  Substance Use Topics  . Alcohol use: No    Alcohol/week: 0.0 standard drinks  . Drug use: No    Allergies: No Known Allergies  Medications Prior to Admission  Medication Sig Dispense Refill Last Dose  . Doxylamine-Pyridoxine (DICLEGIS) 10-10 MG TBEC Take 2 tablets by mouth at bedtime. Two tablets at bedtime on day 1 and 2; if symptoms persist, take 1 tablet in morning and 2 tablets at bedtime on day 3; if symptoms persist, may increase to 1 tablet in morning, 1 tablet mid-afternoon, and 2 tablets at bedtime on day 4 (maximum: doxylamine 40 mg/pyridoxine 40 mg (4 tablets) per day). 60 tablet 0 10/14/2019 at Unknown time  . acetaminophen (TYLENOL) 325 MG tablet Take 2 tablets (650 mg total) by mouth every 6 (six) hours as needed (for pain scale < 4). 30 tablet 0   . ferrous sulfate 325 (65 FE) MG EC tablet Take 1 tablet (325 mg total) by mouth every other day. 30 tablet 1   . Prenatal Vit-Fe Fumarate-FA (PRENATAL MULTIVITAMIN) TABS tablet  Take 1 tablet by mouth daily at 12 noon.       Review of Systems  Constitutional: Negative.  Negative for fatigue and fever.  HENT: Negative.   Respiratory: Negative.  Negative for shortness of breath.   Cardiovascular: Negative.  Negative for chest pain.  Gastrointestinal: Positive for abdominal pain. Negative for constipation, diarrhea, nausea and vomiting.  Genitourinary: Negative.  Negative for dysuria, vaginal bleeding and vaginal discharge.  Musculoskeletal: Positive for back pain.  Neurological: Negative.  Negative for dizziness and headaches.   Physical Exam   Blood pressure 121/73, pulse 73, temperature 99 F (37.2 C), resp. rate 17, weight 71.8 kg, last menstrual period 08/25/2019, unknown if currently breastfeeding.  Physical Exam  Nursing note and vitals reviewed. Constitutional: She is oriented to person, place, and time. She appears well-developed and well-nourished. No distress.  HENT:  Head: Normocephalic.  Eyes: Pupils are equal, round, and reactive to light.  Cardiovascular: Normal rate, regular rhythm and normal heart sounds.  Respiratory: Effort normal and breath sounds normal. No respiratory distress.  GI: Soft. Bowel sounds are normal. She  exhibits no distension. There is no abdominal tenderness.  Genitourinary:    Genitourinary Comments: Pelvic exam: Cervix pink, visually closed, without lesion, scant white creamy discharge, vaginal walls and external genitalia normal Bimanual exam: Cervix 0/long/high, firm, anterior, neg CMT, uterus nontender, adnexa without tenderness, enlargement, or mass    Neurological: She is alert and oriented to person, place, and time.  Skin: Skin is warm and dry.  Psychiatric: She has a normal mood and affect. Her behavior is normal. Judgment and thought content normal.    MAU Course  Procedures Results for orders placed or performed during the hospital encounter of 10/14/19 (from the past 24 hour(s))  CBC     Status: None    Collection Time: 10/14/19  8:18 PM  Result Value Ref Range   WBC 7.8 4.0 - 10.5 K/uL   RBC 4.62 3.87 - 5.11 MIL/uL   Hemoglobin 12.5 12.0 - 15.0 g/dL   HCT 34.7 42.5 - 95.6 %   MCV 83.1 80.0 - 100.0 fL   MCH 27.1 26.0 - 34.0 pg   MCHC 32.6 30.0 - 36.0 g/dL   RDW 38.7 56.4 - 33.2 %   Platelets 305 150 - 400 K/uL   nRBC 0.0 0.0 - 0.2 %  hCG, quantitative, pregnancy     Status: Abnormal   Collection Time: 10/14/19  8:18 PM  Result Value Ref Range   hCG, Beta Chain, Quant, S 201,086 (H) <5 mIU/mL  Wet prep, genital     Status: Abnormal   Collection Time: 10/14/19  8:42 PM  Result Value Ref Range   Yeast Wet Prep HPF POC NONE SEEN NONE SEEN   Trich, Wet Prep NONE SEEN NONE SEEN   Clue Cells Wet Prep HPF POC PRESENT (A) NONE SEEN   WBC, Wet Prep HPF POC MODERATE (A) NONE SEEN   Sperm NONE SEEN   Urinalysis, Routine w reflex microscopic     Status: Abnormal   Collection Time: 10/14/19  8:42 PM  Result Value Ref Range   Color, Urine YELLOW YELLOW   APPearance CLEAR CLEAR   Specific Gravity, Urine 1.017 1.005 - 1.030   pH 6.0 5.0 - 8.0   Glucose, UA NEGATIVE NEGATIVE mg/dL   Hgb urine dipstick SMALL (A) NEGATIVE   Bilirubin Urine NEGATIVE NEGATIVE   Ketones, ur NEGATIVE NEGATIVE mg/dL   Protein, ur NEGATIVE NEGATIVE mg/dL   Nitrite NEGATIVE NEGATIVE   Leukocytes,Ua NEGATIVE NEGATIVE   RBC / HPF 0-5 0 - 5 RBC/hpf   WBC, UA 0-5 0 - 5 WBC/hpf   Bacteria, UA RARE (A) NONE SEEN   Squamous Epithelial / LPF 0-5 0 - 5   Mucus PRESENT    US OB Comp AddL Gest Less 14 Wks  Result Date: 10/14/2019 CLINICAL DATA:  Right-sided pain EXAM: TWIN OBSTETRIC <14WK [place] AND TRANSVAGINAL OB US COMPARISON:  None. FINDINGS: Number of IUPs:  2 Chorionicity/Amnionicity:  Dichorionic diamniotic TWIN 1 Yolk sac:  Visualized. Embryo:  Visualized. Cardiac Activity: Visualized. Heart Rate: 140 bpm CRL: 10.9 mm   7 w 1 d                  [place] EDC: 05/31/2020 TWIN 2 Yolk sac:  Visualized. Embryo:  Visualized.  Cardiac Activity: Visualized. Heart Rate: 157 bpm CRL: 12.6 mm   7 w 3 d                  [place] EDC: 05/29/2020 Subchorionic hemorrhage:  None visualized. Maternal uterus/adnexae: Bilateral ovaries  are within normal limits. The left ovary measures 2.6 x 1.8 x 1.7 cm. The right ovary measures 1.6 x 3.5 x 1.6 cm. No significant free fluid. IMPRESSION: Viable dichorionic diamniotic twin pregnancy as above. No specific abnormalities are seen. Electronically Signed   By: Jasmine Pang M.D.   On: 10/14/2019 21:19   US OB LESS THAN 14 WEEKS WITH OB TRANSVAGINAL  Result Date: 10/14/2019 CLINICAL DATA:  Right-sided pain EXAM: TWIN OBSTETRIC <14WK [place] AND TRANSVAGINAL OB US COMPARISON:  None. FINDINGS: Number of IUPs:  2 Chorionicity/Amnionicity:  Dichorionic diamniotic TWIN 1 Yolk sac:  Visualized. Embryo:  Visualized. Cardiac Activity: Visualized. Heart Rate: 140 bpm CRL: 10.9 mm   7 w 1 d                  [place] EDC: 05/31/2020 TWIN 2 Yolk sac:  Visualized. Embryo:  Visualized. Cardiac Activity: Visualized. Heart Rate: 157 bpm CRL: 12.6 mm   7 w 3 d                  [place] EDC: 05/29/2020 Subchorionic hemorrhage:  None visualized. Maternal uterus/adnexae: Bilateral ovaries are within normal limits. The left ovary measures 2.6 x 1.8 x 1.7 cm. The right ovary measures 1.6 x 3.5 x 1.6 cm. No significant free fluid. IMPRESSION: Viable dichorionic diamniotic twin pregnancy as above. No specific abnormalities are seen. Electronically Signed   By: Jasmine Pang M.D.   On: 10/14/2019 21:19   MDM UA, UPT CBC, HCG ABO/Rh- A Pos Wet prep and gc/chlamydia US OB Comp Less 14 weeks with Transvaginal   Assessment and Plan   1. Dichorionic diamniotic twin pregnancy in first trimester   2. Abdominal pain affecting pregnancy   3. Bacterial vaginosis    -Discharge home in stable condition -Rx for metronidazole sent to patient's pharmacy -First trimester precautions discussed -Patient advised to follow-up with OB of choice ASAP to  start prenatal care -Patient may return to MAU as needed or if her condition were to change or worsen   Rolm Bookbinder CNM 10/14/2019, 8:37 PM

## 2019-10-14 NOTE — MAU Note (Signed)
Started having vaginal bleeding 2-3 days ago, none currently.  Started having right side pain that started 2 hours ago, occurs intermittently.  Has not taken anything for the pain.  Denies any PNC/imaging w/ this pregnancy.  Denies abnormal discharge.

## 2019-10-15 LAB — GC/CHLAMYDIA PROBE AMP (~~LOC~~) NOT AT ARMC
Chlamydia: NEGATIVE
Comment: NEGATIVE
Comment: NORMAL
Neisseria Gonorrhea: NEGATIVE

## 2019-11-07 ENCOUNTER — Ambulatory Visit (INDEPENDENT_AMBULATORY_CARE_PROVIDER_SITE_OTHER): Payer: Medicaid Other

## 2019-11-07 DIAGNOSIS — O900 Disruption of cesarean delivery wound: Secondary | ICD-10-CM | POA: Diagnosis not present

## 2019-11-07 DIAGNOSIS — O0990 Supervision of high risk pregnancy, unspecified, unspecified trimester: Secondary | ICD-10-CM | POA: Insufficient documentation

## 2019-11-07 DIAGNOSIS — O30009 Twin pregnancy, unspecified number of placenta and unspecified number of amniotic sacs, unspecified trimester: Secondary | ICD-10-CM

## 2019-11-07 MED ORDER — BLOOD PRESSURE KIT DEVI: 1.0000 | 0 refills | Status: DC

## 2019-11-07 NOTE — Progress Notes (Addendum)
I connected with  Haley Potts on 11/07/19 by a video enabled telemedicine application and verified that I am speaking with the correct person using two identifiers.   I discussed the limitations of evaluation and management by telemedicine. The patient expressed understanding and agreed to proceed.  PRENATAL INTAKE SUMMARY  Haley Potts presents today New OB Nurse Interview.   OB History    Gravida  4   Para  2   Term  2   Preterm  0   AB  1   Living  2     SAB  1   TAB  0   Ectopic  0   Multiple  0   Live Births  2          I have reviewed the patient's medical, obstetrical, social, and family histories, medications, and available lab results.  SUBJECTIVE She has no unusual complaints  OBJECTIVE Initial NOB Intake Interview (New OB)  GENERAL APPEARANCE: oriented to person, place and time   ASSESSMENT Twin pregnancy  PLAN Prenatal care at [facility] BP Cuff ordered, patient will pick up and take to NOB Appt.  Prenatal labs will be done at [facility] visit. PHQ-9=0 New OB Appt. 11/14/2019

## 2019-11-08 ENCOUNTER — Other Ambulatory Visit: Payer: Self-pay

## 2019-11-08 ENCOUNTER — Inpatient Hospital Stay (HOSPITAL_COMMUNITY)
Admission: AD | Admit: 2019-11-08 | Discharge: 2019-11-09 | Disposition: A | Discharge status: Home / Self Care | Payer: Medicaid Other | Attending: Obstetrics & Gynecology | Admitting: Obstetrics & Gynecology

## 2019-11-08 ENCOUNTER — Encounter (HOSPITAL_COMMUNITY): Payer: Self-pay | Admitting: Obstetrics & Gynecology

## 2019-11-08 DIAGNOSIS — M7918 Myalgia, other site: Secondary | ICD-10-CM | POA: Diagnosis not present

## 2019-11-08 DIAGNOSIS — Z3A10 10 weeks gestation of pregnancy: Secondary | ICD-10-CM | POA: Insufficient documentation

## 2019-11-08 DIAGNOSIS — R109 Unspecified abdominal pain: Secondary | ICD-10-CM | POA: Insufficient documentation

## 2019-11-08 DIAGNOSIS — O219 Vomiting of pregnancy, unspecified: Secondary | ICD-10-CM | POA: Insufficient documentation

## 2019-11-08 DIAGNOSIS — O30041 Twin pregnancy, dichorionic/diamniotic, first trimester: Secondary | ICD-10-CM | POA: Insufficient documentation

## 2019-11-08 DIAGNOSIS — O26891 Other specified pregnancy related conditions, first trimester: Secondary | ICD-10-CM | POA: Diagnosis not present

## 2019-11-08 LAB — URINALYSIS, ROUTINE W REFLEX MICROSCOPIC
Bilirubin Urine: NEGATIVE
Glucose, UA: NEGATIVE mg/dL
Ketones, ur: NEGATIVE mg/dL
Nitrite: NEGATIVE
Protein, ur: NEGATIVE mg/dL
Specific Gravity, Urine: 1.004 — ABNORMAL LOW (ref 1.005–1.030)
pH: 6 (ref 5.0–8.0)

## 2019-11-08 LAB — CBC
HCT: 37.6 % (ref 36.0–46.0)
Hemoglobin: 12.5 g/dL (ref 12.0–15.0)
MCH: 28.2 pg (ref 26.0–34.0)
MCHC: 33.2 g/dL (ref 30.0–36.0)
MCV: 84.7 fL (ref 80.0–100.0)
Platelets: 271 10*3/uL (ref 150–400)
RBC: 4.44 MIL/uL (ref 3.87–5.11)
RDW: 13.2 % (ref 11.5–15.5)
WBC: 8.1 10*3/uL (ref 4.0–10.5)
nRBC: 0 % (ref 0.0–0.2)

## 2019-11-08 MED ORDER — LACTATED RINGERS IV BOLUS
250.0000 mL | Freq: Once | INTRAVENOUS | Status: AC
  Administered 2019-11-08: 250 mL via INTRAVENOUS

## 2019-11-08 MED ORDER — PROMETHAZINE HCL 25 MG/ML IJ SOLN
25.0000 mg | Freq: Once | INTRAMUSCULAR | Status: AC
  Administered 2019-11-08: 25 mg via INTRAVENOUS
  Filled 2019-11-08: qty 1

## 2019-11-08 NOTE — MAU Note (Signed)
Pt reports to MAU c/o pain on her right side near her rib cage. Pt denies bleeding or vaginal discharge. Pt states the pain is a 10/10 and is constant. Pt states tylenol is no longer helping. Pt is vomiting everything she eats and drinks even with the medications prescribed.

## 2019-11-09 DIAGNOSIS — M7918 Myalgia, other site: Secondary | ICD-10-CM | POA: Diagnosis not present

## 2019-11-09 DIAGNOSIS — O26891 Other specified pregnancy related conditions, first trimester: Secondary | ICD-10-CM | POA: Diagnosis not present

## 2019-11-09 DIAGNOSIS — O30041 Twin pregnancy, dichorionic/diamniotic, first trimester: Secondary | ICD-10-CM

## 2019-11-09 DIAGNOSIS — Z3A10 10 weeks gestation of pregnancy: Secondary | ICD-10-CM

## 2019-11-09 DIAGNOSIS — O219 Vomiting of pregnancy, unspecified: Secondary | ICD-10-CM | POA: Diagnosis not present

## 2019-11-09 LAB — RAPID URINE DRUG SCREEN, HOSP PERFORMED
Amphetamines: NOT DETECTED
Barbiturates: NOT DETECTED
Benzodiazepines: NOT DETECTED
Cocaine: NOT DETECTED
Opiates: NOT DETECTED
Tetrahydrocannabinol: NOT DETECTED

## 2019-11-09 MED ORDER — PROMETHAZINE HCL 25 MG PO TABS: 25.0000 mg | ORAL_TABLET | Freq: Four times a day (QID) | ORAL | 0 refills | Status: DC | PRN

## 2019-11-09 NOTE — Progress Notes (Addendum)
Attempted to get FHR via doppler, unsuccessful, Bedside US done by Yolanda Manges., cnm. Fetuses moving on [place].

## 2019-11-09 NOTE — MAU Provider Note (Signed)
History     CSN: [id number]  Arrival date and time: 11/08/19 2213   First Provider Initiated Contact with Patient 11/09/19 0007      Chief Complaint  Patient presents with  . Abdominal Pain  . Emesis   HPI Haley Potts is a 24 y.o. [id number] at [redacted]w[redacted]d with di/di twins who presents to MAU with chief complaint of right torso pain. This is a recurrent problem, onset 10/14/2019. Her pain is located on the far right side of her torso and spans from her rib cage to her right iliac crest. She rates her pain as 10/10. It is triggered by episodes of vomiting. She has attempted management with Tylenol but is finding that less effective the more she takes it. She states she sometimes cries herself to sleep. She also enlists her partner to apply pressure, which worsens to pain, but once her removes his hands she feels a slight improvement in her pain score.   She denies fever, dysuria, flank pain, foul-smelling urine, hx UTIs. She also denies lower abdominal pain, vaginal bleeding, or recent illness.   She receives care with [facility] and her next appointment is 11/14/2019.  OB History    Gravida  4   Para  2   Term  2   Preterm  0   AB  1   Living  2     SAB  1   TAB  0   Ectopic  0   Multiple  0   Live Births  2           Past Medical History:  Diagnosis Date  . Depression    Post partum   * lasted 4 months    Past Surgical History:  Procedure Laterality Date  . WISDOM TOOTH EXTRACTION      No family history on file.  Social History   Tobacco Use  . Smoking status: Never Smoker  . Smokeless tobacco: Never Used  Substance Use Topics  . Alcohol use: No    Alcohol/week: 0.0 standard drinks  . Drug use: No    Allergies: No Known Allergies  No medications prior to admission.    Review of Systems  Constitutional: Negative for fever.  Respiratory: Negative for shortness of breath.   Gastrointestinal: Positive for abdominal pain.  Genitourinary:  Negative for dysuria, flank pain and vaginal bleeding.  Musculoskeletal: Negative for back pain.  All other systems reviewed and are negative.  Physical Exam   Blood pressure 126/87, pulse (!) 102, temperature 98.5 F (36.9 C), temperature source Oral, resp. rate 17, weight 69.2 kg, last menstrual period 08/25/2019, unknown if currently breastfeeding.  Physical Exam  Nursing note and vitals reviewed. Constitutional: She is oriented to person, place, and time. She appears well-developed and well-nourished.  Cardiovascular: Normal rate and normal heart sounds.  Respiratory: Effort normal and breath sounds normal.  GI: Soft. Bowel sounds are normal. She exhibits no distension.  Neurological: She is alert and oriented to person, place, and time.  Skin: Skin is warm and dry.  Psychiatric: She has a normal mood and affect. Her behavior is normal. Judgment and thought content normal.    MAU Course/MDM  Procedures  --Pertinent negatives: dysuria, elevated WBCs, fever, CVAT, hx UTIs --Most recently took Tylenol about 2-3 hours prior to arrival in MAU. Advised trial of Phenergan given recurrent vomiting as trigger. Patient reports her pain effectively managed, desires outpatient rx --Bedside Ultrasound for FHR check: Doppler attempted without success. Patient informed that  the ultrasound is considered a limited obstetric ultrasound and is not intended to be a complete ultrasound exam.  Patient also informed that the ultrasound is not being completed with the intent of assessing for fetal or placental anomalies or any pelvic abnormalities.  Explained that the purpose of today's ultrasound is to assess for fetal heart rate.  Patient acknowledges the purpose of the exam and the limitations of the study.    Patient Vitals for the past 24 hrs:  BP Temp Temp src Pulse Resp Weight  11/08/19 2230 126/87 98.5 F (36.9 C) Oral (!) 102 17 69.2 kg  11/08/19 2227 -- -- -- -- -- 69.2 kg   Results for orders  placed or performed during the hospital encounter of 11/08/19 (from the past 24 hour(s))  Urinalysis, Routine w reflex microscopic     Status: Abnormal   Collection Time: 11/08/19 11:41 PM  Result Value Ref Range   Color, Urine STRAW (A) YELLOW   APPearance CLEAR CLEAR   Specific Gravity, Urine 1.004 (L) 1.005 - 1.030   pH 6.0 5.0 - 8.0   Glucose, UA NEGATIVE NEGATIVE mg/dL   Hgb urine dipstick SMALL (A) NEGATIVE   Bilirubin Urine NEGATIVE NEGATIVE   Ketones, ur NEGATIVE NEGATIVE mg/dL   Protein, ur NEGATIVE NEGATIVE mg/dL   Nitrite NEGATIVE NEGATIVE   Leukocytes,Ua SMALL (A) NEGATIVE   RBC / HPF 0-5 0 - 5 RBC/hpf   WBC, UA 0-5 0 - 5 WBC/hpf   Bacteria, UA RARE (A) NONE SEEN   Squamous Epithelial / LPF 0-5 0 - 5  Rapid urine drug screen (hospital performed)     Status: None   Collection Time: 11/08/19 11:41 PM  Result Value Ref Range   Opiates NONE DETECTED NONE DETECTED   Cocaine NONE DETECTED NONE DETECTED   Benzodiazepines NONE DETECTED NONE DETECTED   Amphetamines NONE DETECTED NONE DETECTED   Tetrahydrocannabinol NONE DETECTED NONE DETECTED   Barbiturates NONE DETECTED NONE DETECTED  CBC     Status: None   Collection Time: 11/08/19 11:41 PM  Result Value Ref Range   WBC 8.1 4.0 - 10.5 K/uL   RBC 4.44 3.87 - 5.11 MIL/uL   Hemoglobin 12.5 12.0 - 15.0 g/dL   HCT 37.8 58.8 - 50.2 %   MCV 84.7 80.0 - 100.0 fL   MCH 28.2 26.0 - 34.0 pg   MCHC 33.2 30.0 - 36.0 g/dL   RDW 77.4 12.8 - 78.6 %   Platelets 271 150 - 400 K/uL   nRBC 0.0 0.0 - 0.2 %   Meds ordered this encounter  Medications  . promethazine (PHENERGAN) injection 25 mg  . lactated ringers bolus 250 mL  . promethazine (PHENERGAN) 25 MG tablet    Sig: Take 1 tablet (25 mg total) by mouth every 6 (six) hours as needed for nausea or vomiting.    Dispense:  30 tablet    Refill:  0    Order Specific Question:   Supervising Provider    Answer:   Lazaro Arms [[id number]]    Assessment and Plan  --24 y.o. [phone]  with di/di twins at [redacted]w[redacted]d  -- + cardiac flicker x 2, very active fetal movement --Musculoskeletal pain associated with recurrent vomiting --Patient denies pain at time of discharge. Continue Tylenol and Phenergan PRN --Discharge home in stable condition  F/U: --[facility] 11/14/2019  Calvert Cantor, CNM 11/09/2019, 7:12 AM

## 2019-11-09 NOTE — Discharge Instructions (Signed)
First Trimester of Pregnancy  The first trimester of pregnancy is from week 1 until the end of week 13 (months 1 through 3). During this time, your baby will begin to develop inside you. At 6-8 weeks, the eyes and face are formed, and the heartbeat can be seen on ultrasound. At the end of 12 weeks, all the baby's organs are formed. Prenatal care is all the medical care you receive before the birth of your baby. Make sure you get good prenatal care and follow all of your doctor's instructions. Follow these instructions at home: Medicines  Take over-the-counter and prescription medicines only as told by your doctor. Some medicines are safe and some medicines are not safe during pregnancy.  Take a prenatal vitamin that contains at least 600 micrograms (mcg) of folic acid.  If you have trouble pooping (constipation), take medicine that will make your stool soft (stool softener) if your doctor approves. Eating and drinking   Eat regular, healthy meals.  Your doctor will tell you the amount of weight gain that is right for you.  Avoid raw meat and uncooked cheese.  If you feel sick to your stomach (nauseous) or throw up (vomit): ? Eat 4 or 5 small meals a day instead of 3 large meals. ? Try eating a few soda crackers. ? Drink liquids between meals instead of during meals.  To prevent constipation: ? Eat foods that are high in fiber, like fresh fruits and vegetables, whole grains, and beans. ? Drink enough fluids to keep your pee (urine) clear or pale yellow. Activity  Exercise only as told by your doctor. Stop exercising if you have cramps or pain in your lower belly (abdomen) or low back.  Do not exercise if it is too hot, too humid, or if you are in a place of great height (high altitude).  Try to avoid standing for long periods of time. Move your legs often if you must stand in one place for a long time.  Avoid heavy lifting.  Wear low-heeled shoes. Sit and stand up  straight.  You can have sex unless your doctor tells you not to. Relieving pain and discomfort  Wear a good support bra if your breasts are sore.  Take warm water baths (sitz baths) to soothe pain or discomfort caused by hemorrhoids. Use hemorrhoid cream if your doctor says it is okay.  Rest with your legs raised if you have leg cramps or low back pain.  If you have puffy, bulging veins (varicose veins) in your legs: ? Wear support hose or compression stockings as told by your doctor. ? Raise (elevate) your feet for 15 minutes, 3-4 times a day. ? Limit salt in your food. Prenatal care  Schedule your prenatal visits by the twelfth week of pregnancy.  Write down your questions. Take them to your prenatal visits.  Keep all your prenatal visits as told by your doctor. This is important. Safety  Wear your seat belt at all times when driving.  Make a list of emergency phone numbers. The list should include numbers for family, friends, the hospital, and police and fire departments. General instructions  Ask your doctor for a referral to a local prenatal class. Begin classes no later than at the start of month 6 of your pregnancy.  Ask for help if you need counseling or if you need help with nutrition. Your doctor can give you advice or tell you where to go for help.  Do not use hot tubs, steam   rooms, or saunas.  Do not douche or use tampons or scented sanitary pads.  Do not cross your legs for long periods of time.  Avoid all herbs and alcohol. Avoid drugs that are not approved by your doctor.  Do not use any tobacco products, including cigarettes, chewing tobacco, and electronic cigarettes. If you need help quitting, ask your doctor. You may get counseling or other support to help you quit.  Avoid cat litter boxes and soil used by cats. These carry germs that can cause birth defects in the baby and can cause a loss of your baby (miscarriage) or stillbirth.  Visit your dentist.  At home, brush your teeth with a soft toothbrush. Be gentle when you floss. Contact a doctor if:  You are dizzy.  You have mild cramps or pressure in your lower belly.  You have a nagging pain in your belly area.  You continue to feel sick to your stomach, you throw up, or you have watery poop (diarrhea).  You have a bad smelling fluid coming from your vagina.  You have pain when you pee (urinate).  You have increased puffiness (swelling) in your face, hands, legs, or ankles. Get help right away if:  You have a fever.  You are leaking fluid from your vagina.  You have spotting or bleeding from your vagina.  You have very bad belly cramping or pain.  You gain or lose weight rapidly.  You throw up blood. It may look like coffee grounds.  You are around people who have German measles, fifth disease, or chickenpox.  You have a very bad headache.  You have shortness of breath.  You have any kind of trauma, such as from a fall or a car accident. Summary  The first trimester of pregnancy is from week 1 until the end of week 13 (months 1 through 3).  To take care of yourself and your unborn baby, you will need to eat healthy meals, take medicines only if your doctor tells you to do so, and do activities that are safe for you and your baby.  Keep all follow-up visits as told by your doctor. This is important as your doctor will have to ensure that your baby is healthy and growing well. This information is not intended to replace advice given to you by your health care provider. Make sure you discuss any questions you have with your health care provider. Document Revised: 10/11/2018 Document Reviewed: 06/28/2016 Elsevier Patient Education  2020 Elsevier Inc.  

## 2019-11-14 ENCOUNTER — Other Ambulatory Visit: Payer: Self-pay

## 2019-11-14 ENCOUNTER — Ambulatory Visit (INDEPENDENT_AMBULATORY_CARE_PROVIDER_SITE_OTHER): Payer: Medicaid Other | Admitting: Advanced Practice Midwife

## 2019-11-14 VITALS — BP 112/68 | HR 94 | Wt 154.6 lb

## 2019-11-14 DIAGNOSIS — O0990 Supervision of high risk pregnancy, unspecified, unspecified trimester: Secondary | ICD-10-CM

## 2019-11-14 DIAGNOSIS — O30049 Twin pregnancy, dichorionic/diamniotic, unspecified trimester: Secondary | ICD-10-CM | POA: Insufficient documentation

## 2019-11-14 DIAGNOSIS — O30041 Twin pregnancy, dichorionic/diamniotic, first trimester: Secondary | ICD-10-CM

## 2019-11-14 DIAGNOSIS — Z3481 Encounter for supervision of other normal pregnancy, first trimester: Secondary | ICD-10-CM

## 2019-11-14 DIAGNOSIS — Z3A11 11 weeks gestation of pregnancy: Secondary | ICD-10-CM

## 2019-11-14 MED ORDER — ASPIRIN EC 81 MG PO TBEC: 81.0000 mg | DELAYED_RELEASE_TABLET | Freq: Every day | ORAL | 6 refills | Status: DC

## 2019-11-14 NOTE — Progress Notes (Signed)
NOB in office, reports right sided pain at times. NOB intake completed on 11-07-19

## 2019-11-14 NOTE — Progress Notes (Signed)
Subjective:   Haley Potts is a 24 y.o. [id number] at 19w4dby early ultrasound being seen today for her first obstetrical visit.  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 Patient does not intend to breast feed. Pregnancy history fully reviewed.  Patient reports nausea is improving.  HISTORY: OB History  Gravida Para Term Preterm AB Living  _0 0 1 2  SAB TAB Ectopic Multiple Live Births  1 0 0 0 2    # Outcome Date GA Lbr Len/2nd Weight Sex Delivery Anes PTL Lv  4 Current           3 Term 06/18/19 [id number] / 00:02 7 lb 6.2 oz (3.351 kg) M Vag-Spont EPI  LIV     Name: Vessels,BOY Aarini     Apgar1: 8  Apgar5: 9  2 SAB 07/01/18 [id number]      1 Term 01/24/17 [id number] / 00:23 7 lb 11.8 oz (3.51 kg) F Vag-Spont EPI  LIV     Name: Cozad,GIRL Mallerie     Apgar1: 7  Apgar5: 8   Past Medical History:  Diagnosis Date  . Depression    Post partum   * lasted 4 months   Past Surgical History:  Procedure Laterality Date  . WISDOM TOOTH EXTRACTION     No family history on file. Social History   Tobacco Use  . Smoking status: Never Smoker  . Smokeless tobacco: Never Used  Substance Use Topics  . Alcohol use: No    Alcohol/week: 0.0 standard drinks  . Drug use: No   No Known Allergies Current Outpatient Medications on File Prior to Visit  Medication Sig Dispense Refill  . Blood Pressure Monitoring (BLOOD PRESSURE KIT) DEVI 1 kit by Does not apply route once a week. Check Blood Pressure regularly and record readings into the Babyscripts App.  Large Cuff.  DX O90.0 1 each 0  . Prenatal Vit-Fe Fumarate-FA (PRENATAL MULTIVITAMIN) TABS tablet Take 1 tablet by mouth daily at 12 noon.    . promethazine (PHENERGAN) 25 MG tablet Take 1 tablet (25 mg total) by mouth every 6 (six) hours  as needed for nausea or vomiting. 30 tablet 0  . acetaminophen (TYLENOL) 325 MG tablet Take 2 tablets (650 mg total) by mouth every 6 (six) hours as needed (for pain scale < 4). (Patient not taking: Reported on 11/07/2019) 30 tablet 0  . ferrous sulfate 325 (65 FE) MG EC tablet Take 1 tablet (325 mg total) by mouth every other day. (Patient not taking: Reported on 11/07/2019) 30 tablet 1   No current facility-administered medications on file prior to visit.     Indications for ASA therapy (per uptodate) One of the following: Previous pregnancy with preeclampsia, especially early onset and with an adverse outcome No Multifetal gestation Yes Chronic hypertension No Type 1 or 2 diabetes mellitus No Chronic kidney disease No Autoimmune disease (antiphospholipid syndrome, systemic lupus erythematosus) No   Indications for early 1 hour GTT (per uptodate)  BMI >25 (>23 in Asian women) AND one of the following  Gestational diabetes mellitus in a previous pregnancy No Glycated hemoglobin ?5.7 percent (39 mmol/mol), impaired glucose tolerance, or impaired fasting glucose on previous testing No First-degree relative with diabetes No High-risk race/ethnicity (eg, African American, Latino, Native American,  Asian American, Pacific Islander) Yes History of cardiovascular disease No Hypertension or on therapy for hypertension No High-density lipoprotein cholesterol level <35 mg/dL (0.90 mmol/L) and/or a triglyceride level >250 mg/dL (2.82 mmol/L) No Polycystic ovary syndrome No Physical inactivity No Other clinical condition associated with insulin resistance (eg, severe obesity, acanthosis nigricans) No Previous birth of an infant weighing ?4000 g No Previous stillbirth of unknown cause No Exam   Vitals:   11/14/19 1315  BP: 112/68  Pulse: 94  Weight: 154 lb 9.6 oz (70.1 kg)      Uterus:     Pelvic Exam: Perineum: no hemorrhoids, normal perineum   Vulva: normal external genitalia, no  lesions   Vagina:  normal mucosa, normal discharge   Cervix: no lesions and normal, pap smear done.    Adnexa: normal adnexa and no mass, fullness, tenderness   Bony Pelvis: average  System: General: well-developed, well-nourished female in no acute distress   Breast:  normal appearance, no masses or tenderness   Skin: normal coloration and turgor, no rashes   Neurologic: oriented, normal, negative, normal mood   Extremities: normal strength, tone, and muscle mass, ROM of all joints is normal   HEENT PERRLA, extraocular movement intact and sclera clear, anicteric   Mouth/Teeth mucous membranes moist, pharynx normal without lesions and dental hygiene good   Neck supple and no masses   Cardiovascular: regular rate and rhythm   Respiratory:  no respiratory distress, normal breath sounds   Abdomen: soft, non-tender; bowel sounds normal; no masses,  no organomegaly     Assessment:   Pregnancy: [id number] Patient Active Problem List   Diagnosis Date Noted  . Dichorionic diamniotic twin pregnancy in first trimester 11/14/2019  . Supervision of high risk pregnancy, antepartum 11/07/2019  . 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 Supervision of high risk pregnancy, antepartum --Anticipatory guidance about next visits/weeks of pregnancy given. --Next visit in 4 weeks, in person for AFP - Culture, OB Urine - Hepatitis C Antibody - Obstetric Panel, Including HIV - Babyscripts Schedule Optimization - Genetic Screening   2. Dichorionic diamniotic twin pregnancy in first trimester --Doing well, nausea improving --Start ASA to prevent PEC    Initial labs drawn. Continue prenatal vitamins. Discussed and offered genetic screening options, including Quad screen/AFP, NIPS testing, and option to decline testing. Benefits/risks/alternatives reviewed. Pt aware that anatomy US is form of genetic screening with lower accuracy in detecting trisomies than blood work.  Pt chooses genetic  screening today. NIPS: ordered. Ultrasound discussed; fetal anatomic survey: requested. Problem list reviewed and updated. The nature of [facility] with multiple MDs and other Advanced Practice Providers was explained to patient; also emphasized that residents, students are part of our team. Routine obstetric precautions reviewed. Return in about 4 weeks (around 12/12/2019).   Fatima Blank, CNM 11/14/19 3:36 PM

## 2019-11-14 NOTE — Patient Instructions (Signed)
National Guideline Alliance ([place]).Twin and Triplet Pregnancy. [place]: National Institute for Health and Care Excellence ([place]); 2019.">  Multiple Pregnancy Multiple pregnancy means that a woman is carrying more than one baby at a time. She may be pregnant with twins, triplets, or more. The majority of multiple pregnancies are twins. Naturally conceiving triplets or more (higher-order multiples) is rare. Multiple pregnancies are riskier than single pregnancies. A woman with a multiple pregnancy is more likely to have certain problems during her pregnancy. How does a multiple pregnancy happen? A multiple pregnancy happens when:  The woman's body releases more than one egg at a time, and then each egg gets fertilized by a different sperm. ? This is the most common type of multiple pregnancy. ? Twins or other multiples produced this way are called fraternal. They are no more alike than non-multiple siblings are.  One sperm fertilizes one egg, which then divides into more than one embryo. ? Twins or other multiples produced this way are called identical. Identical multiples are always the same gender, and they look very much alike. Who is most likely to have a multiple pregnancy? A multiple pregnancy is more likely to develop in women who:  Have had fertility treatment, especially if the treatment included fertility medicines.  Are older than 23 years of age.  Have already had four or more children.  Have a family history of multiple pregnancy. How is a multiple pregnancy diagnosed? A multiple pregnancy may be diagnosed based on:  Symptoms such as: ? Rapid weight gain in the first 3 months of pregnancy (first trimester). ? More severe nausea and breast tenderness than what is typical of a single pregnancy. ? A larger uterus than what is normal for the stage of the pregnancy.  Blood tests that detect a higher-than-normal level of human chorionic gonadotropin (hCG). This is a hormone that your  body produces in early pregnancy.  An ultrasound exam. This is used to confirm that you are carrying multiples. What risks come with multiple pregnancy? A multiple pregnancy puts you at a higher risk for certain problems during or after your pregnancy. These include:  Delivering your babies before your due date (preterm birth). A full-term pregnancy lasts for at least 37 weeks. ? Babies born before 37 weeks may have a higher risk for breathing problems, feeding difficulties, cerebral palsy, and learning disabilities.  Diabetes.  Preeclampsia. This is a serious condition that causes high blood pressure and headaches during pregnancy.  Too much blood loss after childbirth (postpartum hemorrhage).  Postpartum depression.  Low birth weight of the babies. How will having a multiple pregnancy affect my care? Your health care team will monitor you more closely. You may need more frequent prenatal visits. This will ensure that you are healthy and that your babies are growing normally. Follow these instructions at home: Eating and drinking  Increase your nutrition. ? Follow your health care provider's recommendations for weight gain. You may need to gain a little extra weight when you are pregnant with multiples. ? Eat healthy snacks often throughout the day. This will add calories and reduce nausea.  Drink enough fluid to keep your urine pale yellow.  Take prenatal vitamins. Ask your health care provider what vitamins are right for you. Activity Limit your activities by 20-24 weeks of pregnancy.  Rest often.  Avoid activities, exercise, and work that take a lot of effort.  Ask your health care provider when you should stop having sex. General instructions  Do not use any   products that contain nicotine or tobacco, such as cigarettes, e-cigarettes, and chewing tobacco. If you need help quitting, ask your health care provider.  Do not drink alcohol or use illegal drugs.  Take  over-the-counter and prescription medicines only as told by your health care provider.  Arrange for extra help around the house.  Keep all follow-up visits and all prenatal visits as told by your health care provider. This is important. Where to find more information  American College of Obstetricians and Gynecology: www.acog.org Contact a health care provider if:  You have dizziness.  You have nausea, vomiting, or diarrhea that does not go away.  You have depression or other emotions that are interfering with your normal activities.  You have a fever.  You have pain with urination.  You have a bad-smelling vaginal discharge.  You notice increased swelling in your face, hands, legs, or ankles. Get help right away if:  You have fluid leaking from your vagina.  You have bleeding from your vagina.  You have pelvic cramps, pelvic pressure, or nagging pain in your abdomen or lower back.  You are having regular contractions.  You have a severe headache, with or without changes in how you see.  You have chest pain or shortness of breath.  You notice that your babies move less often, or do not move at all. Summary  Having a multiple pregnancy means that a woman is carrying more than one baby at a time.  A multiple pregnancy puts you at a higher risk for delivering your babies before your due date, having diabetes, preeclampsia, too much blood loss after childbirth, or low birth weight of the babies.  Your health care provider will monitor you more closely during your pregnancy.  You may need to make some lifestyle changes during pregnancy. This includes eating more, limiting your activities after 20-24 weeks of pregnancy, and arranging for extra help around the house.  Follow up with your health care provider as instructed if you experience any complications. This information is not intended to replace advice given to you by your health care provider. Make sure you discuss  any questions you have with your health care provider. Document Revised: 02/11/2019 Document Reviewed: 02/11/2019 Elsevier Patient Education  2020 Elsevier Inc.  

## 2019-11-15 LAB — OBSTETRIC PANEL, INCLUDING HIV
Antibody Screen: NEGATIVE
Basophils Absolute: 0 10*3/uL (ref 0.0–0.2)
Basos: 0 %
EOS (ABSOLUTE): 0.1 10*3/uL (ref 0.0–0.4)
Eos: 1 %
HIV Screen 4th Generation wRfx: NONREACTIVE
Hematocrit: 36.2 % (ref 34.0–46.6)
Hemoglobin: 12.4 g/dL (ref 11.1–15.9)
Hepatitis B Surface Ag: NEGATIVE
Immature Grans (Abs): 0 10*3/uL (ref 0.0–0.1)
Immature Granulocytes: 0 %
Lymphocytes Absolute: 1.9 10*3/uL (ref 0.7–3.1)
Lymphs: 25 %
MCH: 28 pg (ref 26.6–33.0)
MCHC: 34.3 g/dL (ref 31.5–35.7)
MCV: 82 fL (ref 79–97)
Monocytes Absolute: 0.6 10*3/uL (ref 0.1–0.9)
Monocytes: 8 %
Neutrophils Absolute: 5.1 10*3/uL (ref 1.4–7.0)
Neutrophils: 66 %
Platelets: 286 10*3/uL (ref 150–450)
RBC: 4.43 x10E6/uL (ref 3.77–5.28)
RDW: 13.4 % (ref 11.7–15.4)
RPR Ser Ql: NONREACTIVE
Rh Factor: POSITIVE
Rubella Antibodies, IGG: 14.9 index (ref >0.99)
WBC: 7.8 10*3/uL (ref 3.4–10.8)

## 2019-11-15 LAB — HEPATITIS C ANTIBODY: Hep C Virus Ab: <0.1 s/co ratio (ref 0.0–0.9)

## 2019-11-16 LAB — URINE CULTURE, OB REFLEX

## 2019-11-16 LAB — CULTURE, OB URINE

## 2019-11-21 ENCOUNTER — Encounter: Payer: Self-pay | Admitting: Advanced Practice Midwife

## 2019-11-25 ENCOUNTER — Encounter: Payer: Self-pay | Admitting: Advanced Practice Midwife

## 2019-11-29 ENCOUNTER — Encounter: Payer: Self-pay | Admitting: Student

## 2019-12-12 ENCOUNTER — Other Ambulatory Visit: Payer: Self-pay

## 2019-12-12 ENCOUNTER — Ambulatory Visit (INDEPENDENT_AMBULATORY_CARE_PROVIDER_SITE_OTHER): Payer: Medicaid Other

## 2019-12-12 VITALS — BP 109/70 | HR 85 | Wt 159.0 lb

## 2019-12-12 DIAGNOSIS — O0992 Supervision of high risk pregnancy, unspecified, second trimester: Secondary | ICD-10-CM

## 2019-12-12 DIAGNOSIS — O30042 Twin pregnancy, dichorionic/diamniotic, second trimester: Secondary | ICD-10-CM

## 2019-12-12 DIAGNOSIS — O0990 Supervision of high risk pregnancy, unspecified, unspecified trimester: Secondary | ICD-10-CM

## 2019-12-12 DIAGNOSIS — Z3A15 15 weeks gestation of pregnancy: Secondary | ICD-10-CM

## 2019-12-12 NOTE — Progress Notes (Signed)
   PRENATAL VISIT NOTE  Subjective:  Haley Potts is a 24 y.o. [phone] at [redacted]w[redacted]d being seen today for ongoing prenatal care.  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  Patient reports backache, no bleeding and no contractions. She has some swelling in feet and occasional RLQ pain that changes with position. She reports sometimes getting headaches after walking up stairs, but also reports minimal water intake.  Contractions: Not present. Vag. Bleeding: None.  Movement: Present. Denies leaking of fluid.   The following portions of the patient's history were reviewed and updated as appropriate: allergies, current medications, past family history, past medical history, past social history, past surgical history and problem list.   Objective:   Vitals:   12/12/19 1458  BP: 109/70  Pulse: 85  Weight: 159 lb (72.1 kg)    Fetal Status: Fetal Heart Rate (bpm): :153 :140   Movement: Present     General:  Alert, oriented and cooperative. Patient is in no acute distress.  Skin: Skin is warm and dry. No rash noted.   Cardiovascular: Normal heart rate noted  Respiratory: Normal respiratory effort, no problems with respiration noted  Abdomen: Soft, gravid, appropriate for gestational age.  Pain/Pressure: Present     Pelvic: Cervical exam deferred        Extremities: Normal range of motion.  Edema: Trace  Mental Status: Normal mood and affect. Normal behavior. Normal judgment and thought content.   Assessment and Plan:  Pregnancy: [id number] at [redacted]w[redacted]d  1. Supervision of high risk pregnancy, antepartum  2. Dichorionic diamniotic twin pregnancy in second trimester  - discussed importance of hydration - RLQ pain most consistent with round ligament pain - needs help setting up babyscripts    Preterm labor symptoms and general obstetric precautions including but not limited to vaginal bleeding, contractions, leaking of fluid and fetal movement were reviewed in detail with the patient. Please refer to After Visit Summary for other counseling recommendations.   No follow-ups on file.  No future appointments.  Bayard Beaver, Medical Student

## 2019-12-12 NOTE — Progress Notes (Signed)
ROB   CC: None    

## 2019-12-12 NOTE — Patient Instructions (Signed)
Postpartum Tubal Ligation Postpartum tubal ligation (PPTL) is a procedure to close the fallopian tubes. This is done so that you cannot get pregnant. When the fallopian tubes are closed, the eggs that the ovaries release cannot enter the uterus, and sperm cannot reach the eggs. PPTL is done right after childbirth or 1-2 days after childbirth, before the uterus returns to its normal location. If you have a cesarean section, it can be performed at the same time as the procedure. Having this done after childbirth does not make your stay in the hospital longer. PPTL is sometimes called "getting your tubes tied." You should not have this procedure if you want to get pregnant again or if you are unsure about having more children. Tell a health care provider about:  Any allergies you have.  All medicines you are taking, including vitamins, herbs, eye drops, creams, and over-the-counter medicines.  Any problems you or family members have had with anesthetic medicines.  Any blood disorders you have.  Any surgeries you have had.  Any medical conditions you have or have had.  Any past pregnancies. What are the risks? Generally, this is a safe procedure. However, problems may occur, including:  Infection.  Bleeding.  Injury to other organs in the abdomen.  Side effects from anesthetic medicines.  Failure of the procedure. If this happens, you could get pregnant.  Having a fertilized egg attach outside the uterus (ectopic pregnancy). What happens before the procedure?  Ask your health care provider about: ? How much pain you can expect to have. ? What medicines you will be given for pain, especially if you are planning to breastfeed. What happens during the procedure? If you had a vaginal delivery:  You will be given one or more of the following: ? A medicine to help you relax (sedative). ? A medicine to numb the area (local anesthetic). ? A medicine to make you fall asleep (general  anesthetic). ? A medicine that is injected into an area of your body to numb everything below the injection site (regional anesthetic).  If you have been given a general anesthetic, a tube will be put down your throat to help you breathe.  An IV will be inserted into one of your veins.  Your bladder may be emptied with a small tube (catheter).  An incision will be made just below your belly button.  Your fallopian tubes will be located and brought up through the incision.  Your fallopian tubes will be tied off, burned (cauterized), or blocked with a clip, ring, or clamp. A small part in the center of each fallopian tube may be removed.  The incision will be closed with stitches (sutures).  A bandage (dressing) will be placed over the incision. If you had a cesarean delivery:  Tubal ligation will be done through the incision that was used for the cesarean delivery of your baby.  The incision will be closed with sutures.  A dressing will be placed over the incision. The procedure may vary among health care providers and hospitals. What happens after the procedure?  Your blood pressure, heart rate, breathing rate, and blood oxygen level will be monitored until you leave the hospital.  You will be given pain medicine as needed.  Do not drive for 24 hours if you were given a sedative during your procedure. Summary  Postpartum tubal ligation is a procedure that closes the fallopian tubes so you cannot get pregnant anymore.  This procedure is done while you are still   in the hospital after childbirth. If you have a cesarean section, it can be performed at the same time.  Having this done after childbirth does not make your stay in the hospital longer.  Postpartum tubal ligation is considered permanent. You should not have this procedure if you want to get pregnant again or if you are unsure about having more children.  Talk to your health care provider to see if this procedure is  right for you. This information is not intended to replace advice given to you by your health care provider. Make sure you discuss any questions you have with your health care provider. Document Revised: 12/03/2018 Document Reviewed: 05/10/2018 Elsevier Patient Education  2020 Elsevier Inc.  

## 2019-12-14 LAB — AFP, SERUM, OPEN SPINA BIFIDA
AFP MoM: 3.6
AFP Value: 114.7 ng/mL
Gest. Age on Collection Date: 15 weeks
Maternal Age At EDD: 23.4 yr
OSBR Risk 1 IN: 401
Test Results:: NEGATIVE
Weight: 159 [lb_av]

## 2019-12-14 LAB — HEMOGLOBIN A1C
Est. average glucose Bld gHb Est-mCnc: 103 mg/dL
Hgb A1c MFr Bld: 5.2 % (ref 4.8–5.6)

## 2019-12-30 ENCOUNTER — Telehealth: Payer: Self-pay

## 2019-12-30 IMAGING — CT CT RENAL STONE PROTOCOL
2 of 4 series · 16 of 46 positions shown, 18 images · non-contrast
Comparison: None.

CLINICAL DATA: Initial evaluation for acute flank pain.

EXAM:
CT ABDOMEN AND PELVIS WITHOUT CONTRAST
TECHNIQUE: Multidetector CT imaging of the abdomen and pelvis was performed
following the standard protocol without IV contrast.

[Series 2: axial st · axial · 0.83mm/px · z∈[+855,+1200]mm · 13 of 79 slices shown, 15 images]
[im 5/79  soft-tissue]
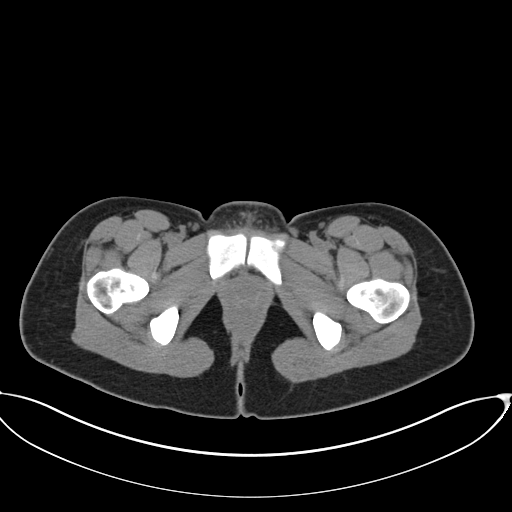
[im 5/79  bone]
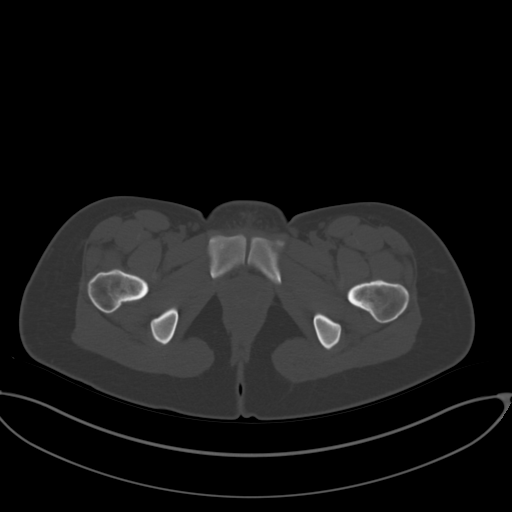
[im 13/79  soft-tissue]
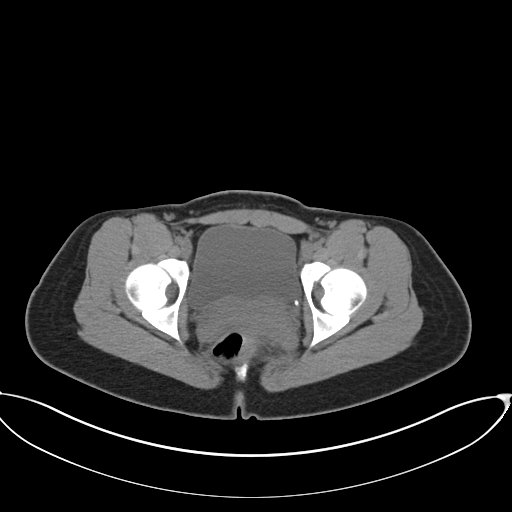
[im 17/79  soft-tissue]
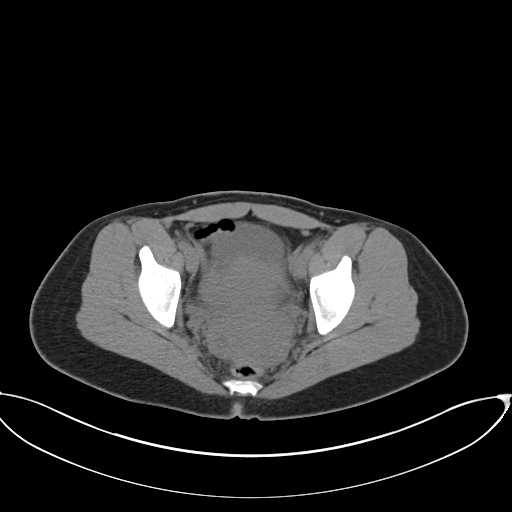
[im 21/79  soft-tissue]
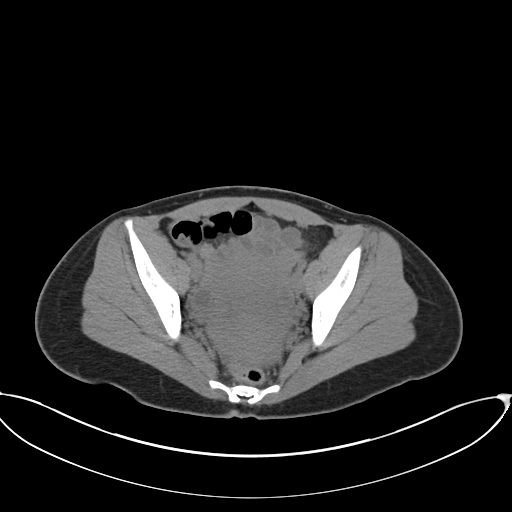
[im 29/79  soft-tissue]
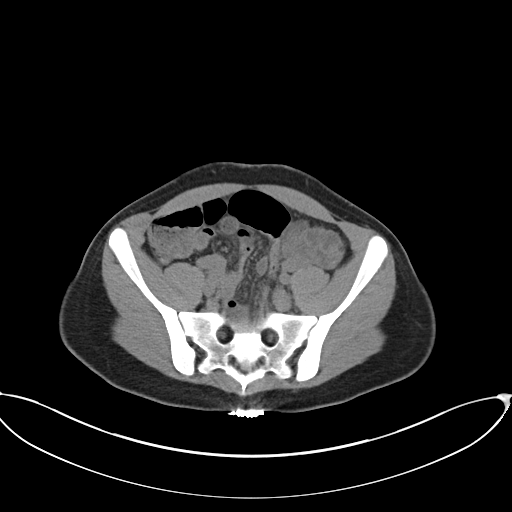
[im 33/79  soft-tissue]
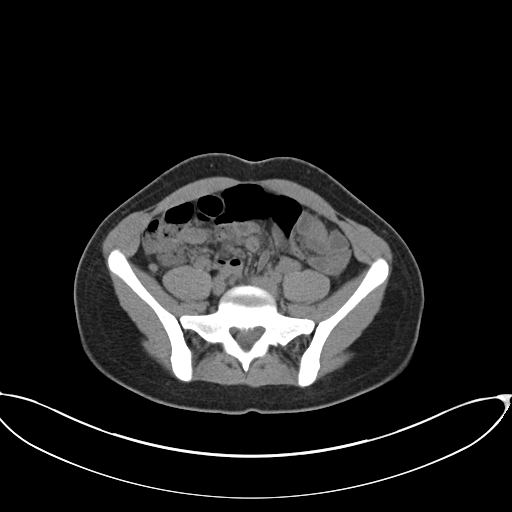
[im 42/79  soft-tissue]
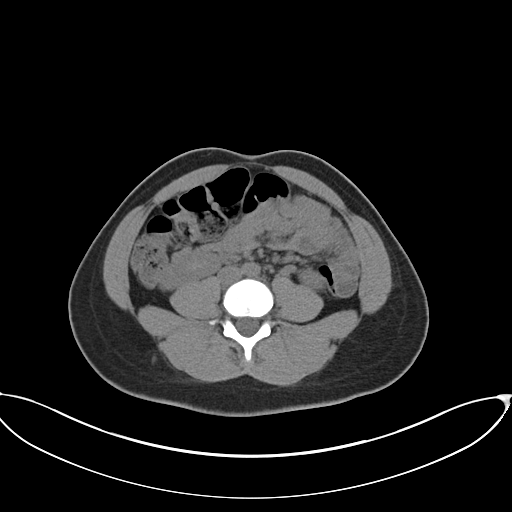
[im 46/79  soft-tissue]
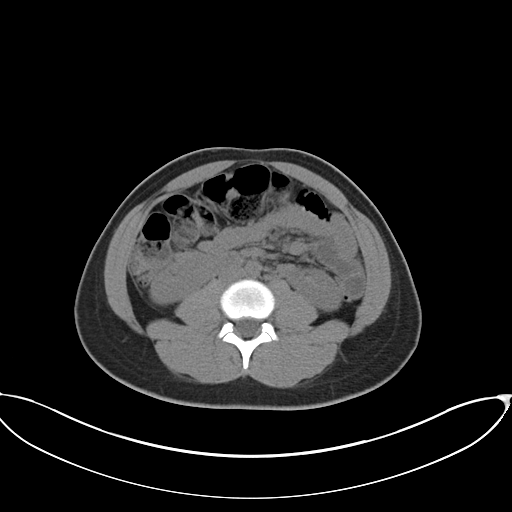
[im 50/79  soft-tissue]
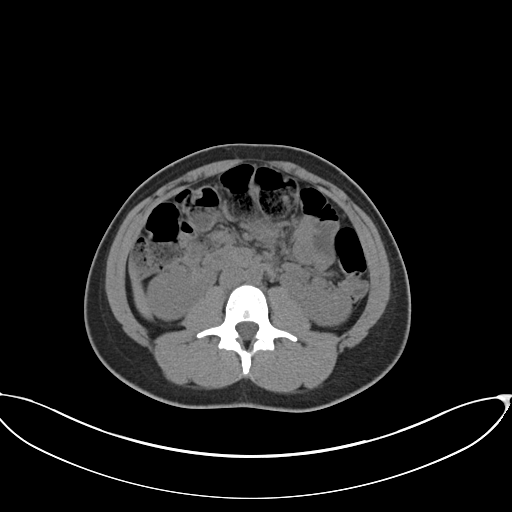
[im 50/79  bone]
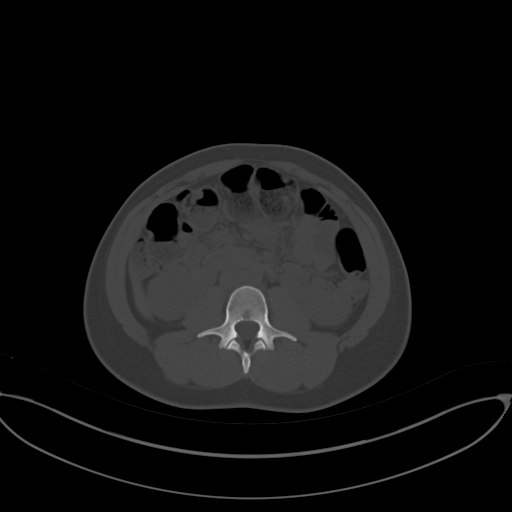
[im 58/79  soft-tissue]
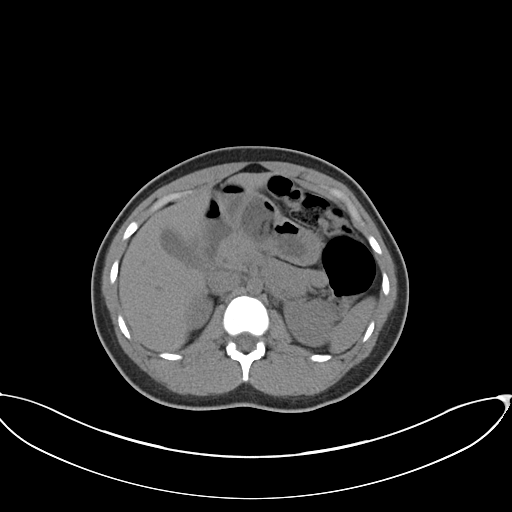
[im 62/79  soft-tissue]
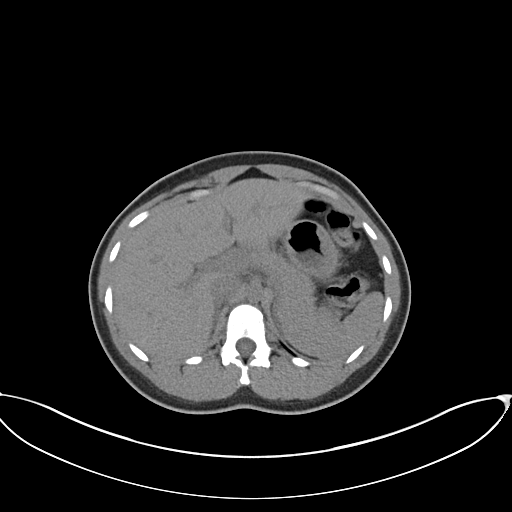
[im 66/79  soft-tissue]
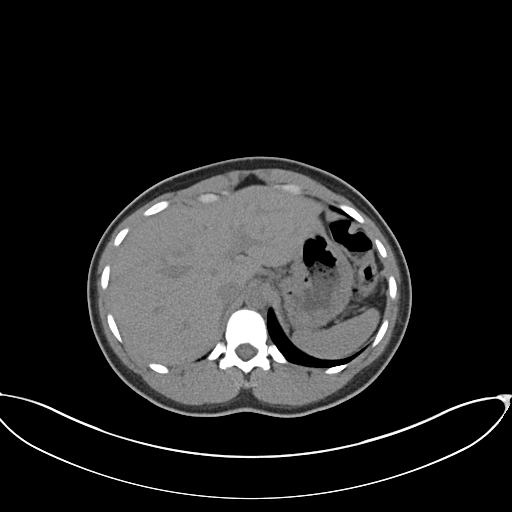
[im 74/79  soft-tissue]
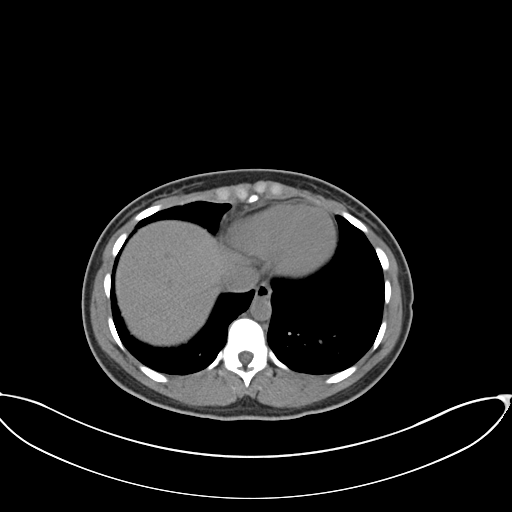

[Series 4: coronal · coronal · 0.66mm/px · 3 of 150 slices shown]
[im 50/150  soft-tissue]
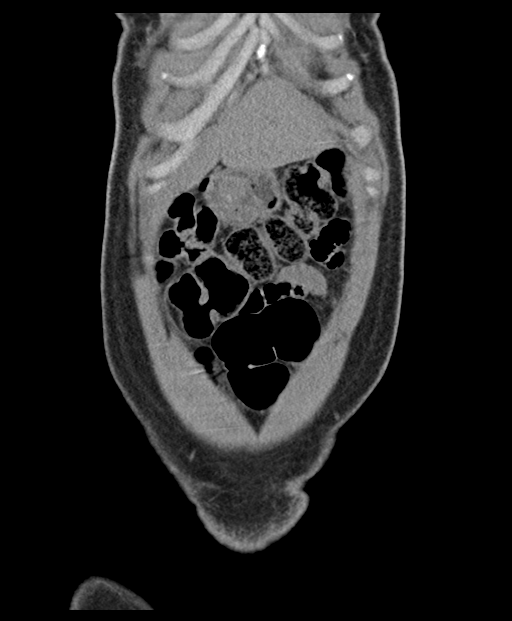
[im 67/150  soft-tissue]
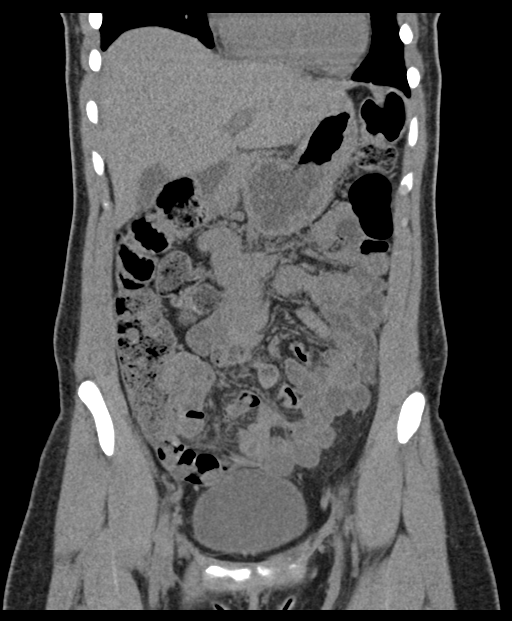
[im 83/150  soft-tissue]
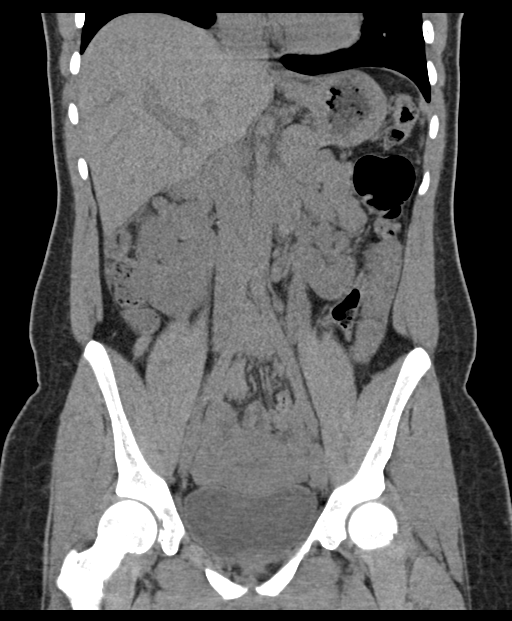

[16 of 46 positions shown; findings below may reference images not displayed]

FINDINGS: Lower chest: Mild subsegmental atelectatic changes seen within the
visualized lung bases. Visualized lungs are otherwise clear.

Hepatobiliary: Liver demonstrates a normal unenhanced appearance.
Gallbladder within normal limits. No biliary dilatation.

Pancreas: Pancreas within normal limits.

Spleen: Spleen within normal limits.

Adrenals/Urinary Tract: Adrenal glands grossly unremarkable. Kidneys
equal in size without evidence for nephrolithiasis or
hydronephrosis. No radiopaque calculi seen along the expected course
of either renal collecting system. No appreciable hydroureter.
Bladder within normal limits. No layering stones within the bladder
lumen.

Stomach/Bowel: Stomach within normal limits. No evidence for bowel
obstruction. Normal appendix. No other acute inflammatory changes
about the bowels. Moderate stool within the colon.

Vascular/Lymphatic: Intra-abdominal aorta of normal caliber. No
appreciable adenopathy on this noncontrast examination.

Reproductive: Uterus within normal limits. 15 mm right adnexal cyst,
most consistent with a normal physiologic ovarian cyst. Ovaries
otherwise unremarkable.

Other: Small volume free fluid within the pelvis, most likely
physiologic. No free intraperitoneal air.

Musculoskeletal: No acute osseus abnormality. No worrisome lytic or
blastic osseous lesions.
IMPRESSION: 1. No CT evidence for nephrolithiasis or obstructive uropathy.
2. 15 mm simple right ovarian cyst with associated small volume free
physiologic fluid within the pelvis.
3. Moderate retained stool within the colon, which could reflect
constipation.
4. No other acute abnormality within the abdomen and pelvis. Normal
appendix.

## 2019-12-30 NOTE — Telephone Encounter (Signed)
Received notification this am from [employer] that pt reported elevated BP of 133/117 and a retake of 104/65 with no symptoms on 12/29/19 @ 0212.    Addison Naegeli, RN

## 2020-01-16 ENCOUNTER — Ambulatory Visit (INDEPENDENT_AMBULATORY_CARE_PROVIDER_SITE_OTHER): Payer: Medicaid Other

## 2020-01-16 ENCOUNTER — Other Ambulatory Visit: Payer: Self-pay

## 2020-01-16 VITALS — BP 110/80 | HR 90 | Wt 160.0 lb

## 2020-01-16 DIAGNOSIS — O30042 Twin pregnancy, dichorionic/diamniotic, second trimester: Secondary | ICD-10-CM

## 2020-01-16 DIAGNOSIS — O0990 Supervision of high risk pregnancy, unspecified, unspecified trimester: Secondary | ICD-10-CM

## 2020-01-16 DIAGNOSIS — O99891 Other specified diseases and conditions complicating pregnancy: Secondary | ICD-10-CM

## 2020-01-16 DIAGNOSIS — M7918 Myalgia, other site: Secondary | ICD-10-CM

## 2020-01-16 DIAGNOSIS — Z3A20 20 weeks gestation of pregnancy: Secondary | ICD-10-CM

## 2020-01-16 MED ORDER — MISC. DEVICES MISC: 0 refills | Status: DC

## 2020-01-16 NOTE — Patient Instructions (Signed)
Postpartum Tubal Ligation Postpartum tubal ligation (PPTL) is a procedure to close the fallopian tubes. This is done so that you cannot get pregnant. When the fallopian tubes are closed, the eggs that the ovaries release cannot enter the uterus, and sperm cannot reach the eggs. PPTL is done right after childbirth or 1-2 days after childbirth, before the uterus returns to its normal location. If you have a cesarean section, it can be performed at the same time as the procedure. Having this done after childbirth does not make your stay in the hospital longer. PPTL is sometimes called "getting your tubes tied." You should not have this procedure if you want to get pregnant again or if you are unsure about having more children. Tell a health care provider about:  Any allergies you have.  All medicines you are taking, including vitamins, herbs, eye drops, creams, and over-the-counter medicines.  Any problems you or family members have had with anesthetic medicines.  Any blood disorders you have.  Any surgeries you have had.  Any medical conditions you have or have had.  Any past pregnancies. What are the risks? Generally, this is a safe procedure. However, problems may occur, including:  Infection.  Bleeding.  Injury to other organs in the abdomen.  Side effects from anesthetic medicines.  Failure of the procedure. If this happens, you could get pregnant.  Having a fertilized egg attach outside the uterus (ectopic pregnancy). What happens before the procedure?  Ask your health care provider about: ? How much pain you can expect to have. ? What medicines you will be given for pain, especially if you are planning to breastfeed. What happens during the procedure? If you had a vaginal delivery:  You will be given one or more of the following: ? A medicine to help you relax (sedative). ? A medicine to numb the area (local anesthetic). ? A medicine to make you fall asleep (general  anesthetic). ? A medicine that is injected into an area of your body to numb everything below the injection site (regional anesthetic).  If you have been given a general anesthetic, a tube will be put down your throat to help you breathe.  An IV will be inserted into one of your veins.  Your bladder may be emptied with a small tube (catheter).  An incision will be made just below your belly button.  Your fallopian tubes will be located and brought up through the incision.  Your fallopian tubes will be tied off, burned (cauterized), or blocked with a clip, ring, or clamp. A small part in the center of each fallopian tube may be removed.  The incision will be closed with stitches (sutures).  A bandage (dressing) will be placed over the incision. If you had a cesarean delivery:  Tubal ligation will be done through the incision that was used for the cesarean delivery of your baby.  The incision will be closed with sutures.  A dressing will be placed over the incision. The procedure may vary among health care providers and hospitals. What happens after the procedure?  Your blood pressure, heart rate, breathing rate, and blood oxygen level will be monitored until you leave the hospital.  You will be given pain medicine as needed.  Do not drive for 24 hours if you were given a sedative during your procedure. Summary  Postpartum tubal ligation is a procedure that closes the fallopian tubes so you cannot get pregnant anymore.  This procedure is done while you are still   in the hospital after childbirth. If you have a cesarean section, it can be performed at the same time.  Having this done after childbirth does not make your stay in the hospital longer.  Postpartum tubal ligation is considered permanent. You should not have this procedure if you want to get pregnant again or if you are unsure about having more children.  Talk to your health care provider to see if this procedure is  right for you. This information is not intended to replace advice given to you by your health care provider. Make sure you discuss any questions you have with your health care provider. Document Revised: 12/03/2018 Document Reviewed: 05/10/2018 Elsevier Patient Education  2020 Elsevier Inc.  

## 2020-01-16 NOTE — Progress Notes (Signed)
° °  HIGH-RISK PREGNANCY OFFICE VISIT  Patient name: Haley Potts MRN [id number]  Date of birth: [birth year] Chief Complaint:   Routine Prenatal Visit  Subjective:   Haley Potts is a 24 y.o. 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  Patient presents today with complaint of backache. Patient denies vaginal concerns including abnormal discharge, leaking of fluid, and bleeding.  Contractions: Not present. Vag. Bleeding: None.  Movement: Present.  Reviewed past medical,surgical, social, obstetrical and family history as well as problem list, medications and allergies.  Objective   Vitals:   01/16/20 1443  BP: 110/80  Pulse: 90  Weight: 160 lb (72.6 kg)  Body mass index is 27.46 kg/m.  Total Weight Gain:10 lb (4.536 kg)         Physical Examination:   General appearance: Well appearing, and in no distress  Mental status: Alert, oriented to person, place, and time  Skin: Warm & dry  Cardiovascular: Normal heart rate noted  Respiratory: Normal respiratory effort, no distress  Abdomen: Soft, gravid, nontender, AGA with Fundal height of Fundal Height: 24 cm  Pelvic: Cervical exam deferred           Extremities: Edema: Trace  Fetal Status: Fetal Heart Rate (bpm): 133, 146  Movement: Present   No results found for this or any previous visit (from the past 24 hour(s)).  Assessment & Plan:  Low-risk pregnancy of a 24 y.o., V3X1062 at [redacted]w[redacted]d with an Estimated Date of Delivery: 05/31/20   1. Supervision of high risk pregnancy, antepartum -Anticipatory guidance for upcoming appts.  2. Musculoskeletal pain -Encouraged rest and relaxation. -Discussed anticipated pregnancy complaints with twin pregnancy. -Maternity belt ordered.    3. Dichorionic diamniotic twin pregnancy in second trimester -Anatomy US ordered -Instructed to pick up bASA today.    Meds: No orders of the defined types were placed in this encounter.  Labs/procedures today:  Lab Orders  No laboratory test(s) ordered today     Reviewed: Preterm labor symptoms and general obstetric precautions including but not limited to vaginal bleeding, contractions, leaking of fluid and fetal movement were reviewed in detail with the patient.  All questions were answered.  Follow-up: Return in about 3 weeks (around 02/06/2020) for HROB.  Orders Placed This Encounter  Procedures   [place] MFM OB DETAIL ADDL GEST +14 WK   AMB MFM GENETICS REFERRAL   Cherre Robins MSN, CNM 01/16/2020

## 2020-01-16 NOTE — Progress Notes (Signed)
Pt presents for routine OB visit Needs Anatomy

## 2020-01-21 ENCOUNTER — Other Ambulatory Visit: Payer: Self-pay

## 2020-01-21 DIAGNOSIS — O0990 Supervision of high risk pregnancy, unspecified, unspecified trimester: Secondary | ICD-10-CM

## 2020-01-21 NOTE — Progress Notes (Signed)
Anatomy scan [place] order entered.

## 2020-01-23 ENCOUNTER — Other Ambulatory Visit: Payer: Self-pay | Admitting: *Deleted

## 2020-01-23 ENCOUNTER — Ambulatory Visit: Payer: Medicaid Other | Attending: Obstetrics and Gynecology

## 2020-01-23 ENCOUNTER — Ambulatory Visit: Payer: Medicaid Other | Admitting: *Deleted

## 2020-01-23 ENCOUNTER — Other Ambulatory Visit: Payer: Self-pay

## 2020-01-23 ENCOUNTER — Ambulatory Visit: Payer: Medicaid Other | Admitting: Genetic Counselor

## 2020-01-23 DIAGNOSIS — O30042 Twin pregnancy, dichorionic/diamniotic, second trimester: Secondary | ICD-10-CM | POA: Diagnosis not present

## 2020-01-23 DIAGNOSIS — Z315 Encounter for genetic counseling: Secondary | ICD-10-CM

## 2020-01-23 DIAGNOSIS — Z3A21 21 weeks gestation of pregnancy: Secondary | ICD-10-CM | POA: Diagnosis not present

## 2020-01-23 DIAGNOSIS — O30049 Twin pregnancy, dichorionic/diamniotic, unspecified trimester: Secondary | ICD-10-CM

## 2020-01-23 DIAGNOSIS — Z363 Encounter for antenatal screening for malformations: Secondary | ICD-10-CM | POA: Diagnosis not present

## 2020-01-23 DIAGNOSIS — O0990 Supervision of high risk pregnancy, unspecified, unspecified trimester: Secondary | ICD-10-CM | POA: Diagnosis not present

## 2020-01-23 DIAGNOSIS — Z148 Genetic carrier of other disease: Secondary | ICD-10-CM

## 2020-01-23 NOTE — Progress Notes (Signed)
I briefly met with Ms. 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 Ms. Lemieux had a formal genetic counseling consultation during her previous pregnancy (see Genetic Counseling note from 02/05/19 for more details). Her baby is now 17 months old and is reportedly doing well.   As discussed during her previous genetic counseling consultation, Ms. Wendell has an increased chance of being a silent carrier for spinal muscular atrophy (SMA). Ms. Klaus was found to have 2 copies of the SMN1 gene on Horizon-14 carrier screening; however, she also has the c.*3+80T>G polymorphism of SMN1 in intron 7 (also known as [id number]>G). This puts her at increased risk (1 in 10) to be a silent 2+0 carrier for SMA. SMA is a condition caused by mutations in the SMN1 gene. Typically, individuals have two copies of the SMN1 gene, with one copy present on each chromosome. In SMA silent carriers, both copies of the SMN1 gene are present on one chromosome, with no copies of SMN1 present on the other chromosome.   We briefly reviewed the features of SMA. SMA is characterized by progressive muscle weakness and atrophy due to degeneration and loss of anterior horn cells (lower motor neurons) in the spinal cord and brain stem. We discussed the different types of SMA (0, I, II, and III), including differences in severity and age of onset. We also reviewed the autosomal recessive inheritance pattern of SMA. We discussed that the couple only has a chance of having a child with SMA if both parents are identified as carriers for the condition. The father of the current pregnancy is the same as her previous pregnancy Engineer, building services). During her last pregnancy, Mr. Elayne Guerin declined partner carrier screening. I asked Ms. Lansdowne if she thought her partner would be interested in partner carrier screening during this pregnancy, which she doubted.  Ms. Lochner was counseled that based on the carrier frequency for SMA in the  African American population, Mr. Elayne Guerin has a 1 in 39 chance of being a carrier of SMA. Without partner screening to refine risk and based on her partner's ethnicity, the couple currently has a 1 in [place] (0.01%) chance of having a child with SMA. If Mr. Elayne Guerin is a carrier for SMA, they would have a 1 in 4 (25%) chance of having a baby with SMA. I informed her that SMA is now a part of [place]'s newborn screen. This is new as of May 2021.  I gave Ms. Leicht a copy of my updated business card and encouraged her to call me if her partner changes his mind about carrier screening. She confirmed that she had no further questions at this time.  Buelah Manis, MS, [facility] Genetic Counselor

## 2020-02-06 ENCOUNTER — Other Ambulatory Visit: Payer: Self-pay

## 2020-02-06 ENCOUNTER — Encounter: Payer: Self-pay | Admitting: Obstetrics and Gynecology

## 2020-02-06 ENCOUNTER — Ambulatory Visit (INDEPENDENT_AMBULATORY_CARE_PROVIDER_SITE_OTHER): Payer: Medicaid Other | Admitting: Obstetrics and Gynecology

## 2020-02-06 VITALS — BP 109/67 | HR 99 | Wt 161.0 lb

## 2020-02-06 DIAGNOSIS — O30042 Twin pregnancy, dichorionic/diamniotic, second trimester: Secondary | ICD-10-CM

## 2020-02-06 DIAGNOSIS — O0990 Supervision of high risk pregnancy, unspecified, unspecified trimester: Secondary | ICD-10-CM

## 2020-02-06 NOTE — Progress Notes (Signed)
   PRENATAL VISIT NOTE  Subjective:  Haley Potts is a 24 y.o. [phone] at [redacted]w[redacted]d being seen today for ongoing prenatal care.  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  Patient reports no complaints.  Contractions: Not present. Vag. Bleeding: None.  Movement: Present. Denies leaking of fluid.   Desires tubal.   The following portions of the patient's history were reviewed and updated as appropriate: allergies, current medications, past family history, past medical history, past social history, past surgical history and problem list.   Objective:   Vitals:   02/06/20 1319  BP: 109/67  Pulse: 99  Weight: 161 lb (73 kg)    Fetal Status: Fetal Heart Rate (bpm): 150 141   Movement: Present     General:  Alert, oriented and cooperative. Patient is in no acute distress.  Skin: Skin is warm and dry. No rash noted.   Cardiovascular: Normal heart rate noted  Respiratory: Normal respiratory effort, no problems with respiration noted  Abdomen: Soft, gravid, appropriate for gestational age.  Pain/Pressure: Present     Pelvic: deferred        Extremities: Normal range of motion.  Edema: Trace  Mental Status: Normal mood and affect. Normal behavior. Normal judgment and thought content.   Assessment and Plan:  Pregnancy: P3X9024 at [redacted]w[redacted]d 1. Supervision of high risk pregnancy, antepartum -2 hour gtt at next visit -encourage maternity support belt use  -discussed covid vaccine, currently declines  -desires tubal after this pregnancy, consent not signed.   2. Dichorionic diamniotic twin pregnancy in second trimester -normal anatomy scan, fetal discordancy 6% -cont asa  -q4 week anat scans    Preterm labor symptoms and general obstetric precautions including but not limited to vaginal  bleeding, contractions, leaking of fluid and fetal movement were reviewed in detail with the patient. Please refer to After Visit Summary for other counseling recommendations.   Return in about 4 weeks (around 03/05/2020) for MD only face to face, OB.  Future Appointments  Date Time Provider Department Center  02/20/2020  1:00 PM [facility] NURSE [facility]  02/20/2020  1:15 PM [facility] US2 [facility]  03/05/2020  9:00 AM Adam Phenix, MD [facility] None    Gita Kudo, MD

## 2020-02-06 NOTE — Progress Notes (Signed)
Pt presents for routine OB visit.  No complaints today per pt

## 2020-02-20 ENCOUNTER — Ambulatory Visit: Payer: Medicaid Other | Attending: Obstetrics and Gynecology

## 2020-02-20 ENCOUNTER — Other Ambulatory Visit: Payer: Self-pay

## 2020-02-20 ENCOUNTER — Other Ambulatory Visit: Payer: Self-pay | Admitting: *Deleted

## 2020-02-20 ENCOUNTER — Ambulatory Visit: Payer: Medicaid Other | Admitting: *Deleted

## 2020-02-20 ENCOUNTER — Encounter: Payer: Self-pay | Admitting: *Deleted

## 2020-02-20 DIAGNOSIS — Z362 Encounter for other antenatal screening follow-up: Secondary | ICD-10-CM

## 2020-02-20 DIAGNOSIS — Z3A25 25 weeks gestation of pregnancy: Secondary | ICD-10-CM

## 2020-02-20 DIAGNOSIS — Z148 Genetic carrier of other disease: Secondary | ICD-10-CM | POA: Diagnosis not present

## 2020-02-20 DIAGNOSIS — O30049 Twin pregnancy, dichorionic/diamniotic, unspecified trimester: Secondary | ICD-10-CM

## 2020-02-20 DIAGNOSIS — O30042 Twin pregnancy, dichorionic/diamniotic, second trimester: Secondary | ICD-10-CM

## 2020-02-20 DIAGNOSIS — O0990 Supervision of high risk pregnancy, unspecified, unspecified trimester: Secondary | ICD-10-CM | POA: Diagnosis not present

## 2020-02-24 ENCOUNTER — Inpatient Hospital Stay (HOSPITAL_COMMUNITY)
Admission: AD | Admit: 2020-02-24 | Discharge: 2020-02-24 | Disposition: A | Discharge status: Home / Self Care | Payer: Medicaid Other | Attending: Obstetrics and Gynecology | Admitting: Obstetrics and Gynecology

## 2020-02-24 ENCOUNTER — Other Ambulatory Visit: Payer: Self-pay

## 2020-02-24 ENCOUNTER — Encounter (HOSPITAL_COMMUNITY): Payer: Self-pay | Admitting: Obstetrics and Gynecology

## 2020-02-24 ENCOUNTER — Inpatient Hospital Stay (HOSPITAL_COMMUNITY): Payer: Medicaid Other

## 2020-02-24 DIAGNOSIS — O98812 Other maternal infectious and parasitic diseases complicating pregnancy, second trimester: Secondary | ICD-10-CM | POA: Diagnosis present

## 2020-02-24 DIAGNOSIS — Z3A26 26 weeks gestation of pregnancy: Secondary | ICD-10-CM | POA: Insufficient documentation

## 2020-02-24 DIAGNOSIS — B379 Candidiasis, unspecified: Secondary | ICD-10-CM | POA: Diagnosis not present

## 2020-02-24 DIAGNOSIS — O99891 Other specified diseases and conditions complicating pregnancy: Secondary | ICD-10-CM | POA: Diagnosis not present

## 2020-02-24 DIAGNOSIS — O30042 Twin pregnancy, dichorionic/diamniotic, second trimester: Secondary | ICD-10-CM | POA: Insufficient documentation

## 2020-02-24 DIAGNOSIS — B373 Candidiasis of vulva and vagina: Secondary | ICD-10-CM | POA: Diagnosis not present

## 2020-02-24 DIAGNOSIS — Z79899 Other long term (current) drug therapy: Secondary | ICD-10-CM | POA: Insufficient documentation

## 2020-02-24 DIAGNOSIS — Z7982 Long term (current) use of aspirin: Secondary | ICD-10-CM | POA: Diagnosis not present

## 2020-02-24 LAB — URINALYSIS, ROUTINE W REFLEX MICROSCOPIC
Bilirubin Urine: NEGATIVE
Glucose, UA: 50 mg/dL — AB
Ketones, ur: NEGATIVE mg/dL
Nitrite: NEGATIVE
Protein, ur: 30 mg/dL — AB
Specific Gravity, Urine: 1.016 (ref 1.005–1.030)
pH: 6 (ref 5.0–8.0)

## 2020-02-24 LAB — WET PREP, GENITAL
Clue Cells Wet Prep HPF POC: NONE SEEN
Sperm: NONE SEEN
Trich, Wet Prep: NONE SEEN

## 2020-02-24 LAB — AMNISURE RUPTURE OF MEMBRANE (ROM) NOT AT ARMC: Amnisure ROM: NEGATIVE

## 2020-02-24 MED ORDER — CYCLOBENZAPRINE HCL 10 MG PO TABS: 10.0000 mg | ORAL_TABLET | Freq: Two times a day (BID) | ORAL | 0 refills | Status: DC | PRN

## 2020-02-24 MED ORDER — ACETAMINOPHEN 500 MG PO TABS
1000.0000 mg | ORAL_TABLET | Freq: Once | ORAL | Status: AC
  Administered 2020-02-24: 1000 mg via ORAL
  Filled 2020-02-24: qty 2

## 2020-02-24 MED ORDER — CYCLOBENZAPRINE HCL 5 MG PO TABS
10.0000 mg | ORAL_TABLET | Freq: Once | ORAL | Status: AC
  Administered 2020-02-24: 10 mg via ORAL
  Filled 2020-02-24: qty 2

## 2020-02-24 MED ORDER — TERCONAZOLE 0.4 % VA CREA: 1.0000 | TOPICAL_CREAM | Freq: Every day | VAGINAL | 0 refills | Status: DC

## 2020-02-24 NOTE — Discharge Instructions (Signed)

## 2020-02-24 NOTE — MAU Provider Note (Signed)
History     CSN: [id number]  Arrival date and time: 02/24/20 1453  First Provider Initiated Contact with Patient 02/24/20 1631      Chief Complaint  Patient presents with  . Contractions   HPI Haley Potts is a 24 y.o. [phone] at 4w1dwith di/di twins who presents to MAU with chief complaint of "sharp" vaginal pain for the past two days. Her pain is aggravated by walking and repositioning but never completely resolves. She does not have a maternity belt. She has not taken medication or tried other treatments for this complaint. She denies lower abdominal contractions, vaginal bleeding, leaking of fluid, decreased fetal movement, fever, falls, or recent illness.   [facility] with [facility]  OB History    Gravida  4   Para  2   Term  2   Preterm  0   AB  1   Living  2     SAB  1   TAB  0   Ectopic  0   Multiple  0   Live Births  2           Past Medical History:  Diagnosis Date  . Depression    Post partum   * lasted 4 months    Past Surgical History:  Procedure Laterality Date  . WISDOM TOOTH EXTRACTION      History reviewed. No pertinent family history.  Social History   Tobacco Use  . Smoking status: Never Smoker  . Smokeless tobacco: Never Used  Vaping Use  . Vaping Use: Never used  Substance Use Topics  . Alcohol use: No    Alcohol/week: 0.0 standard drinks  . Drug use: No    Allergies: No Known Allergies  Medications Prior to Admission  Medication Sig Dispense Refill Last Dose  . acetaminophen (TYLENOL) 325 MG tablet Take 2 tablets (650 mg total) by mouth every 6 (six) hours as needed (for pain scale < 4). 30 tablet 0 02/23/2020 at Unknown time  . aspirin EC 81 MG tablet Take 1 tablet (81 mg total) by mouth daily. 30 tablet 6 02/23/2020 at Unknown time  . Prenatal Vit-Fe Fumarate-FA (PRENATAL MULTIVITAMIN) TABS tablet Take 1 tablet by mouth daily at 12 noon.   02/23/2020 at Unknown time  . Blood Pressure Monitoring (BLOOD  PRESSURE KIT) DEVI 1 kit by Does not apply route once a week. Check Blood Pressure regularly and record readings into the [employer] App.  Large Cuff.  DX O90.0 (Patient not taking: Reported on 01/16/2020) 1 each 0   . Misc. Devices MISC Dispense one maternity belt for patient (Patient not taking: Reported on 02/06/2020) 1 each 0     Review of Systems  Gastrointestinal: Negative for abdominal pain.  Genitourinary: Positive for pelvic pain and vaginal discharge. Negative for vaginal bleeding.  All other systems reviewed and are negative.  Physical Exam   Blood pressure 106/67, pulse (!) 112, temperature 98.5 F (36.9 C), resp. rate 16, weight 73.9 kg, last menstrual period 08/25/2019, SpO2 100 %, unknown if currently breastfeeding.  Physical Exam Vitals and nursing note reviewed. Exam conducted with a chaperone present.  Cardiovascular:     Rate and Rhythm: Normal rate.     Pulses: Normal pulses.  Pulmonary:     Effort: Pulmonary effort is normal.     Breath sounds: Normal breath sounds.  Abdominal:     Comments: Gravid  Skin:    General: Skin is warm and dry.  Capillary Refill: Capillary refill takes less than 2 seconds.  Neurological:     General: No focal deficit present.  Psychiatric:        Mood and Affect: Mood normal.     MAU Course  Procedures   --Greater than 45 minutes spent with multiple RN attempting to tracing twins. Reassuring assessment via BSUS. BPP ordered --Negative pooling, negative fern, negative Amnisure --Abnormal UA, + Leuks and hematuria, without urinary symptoms. Will send for culture.  Patient Vitals for the past 24 hrs:  BP Temp Pulse Resp SpO2 Weight  02/24/20 1800 113/64 -- 94 -- -- --  02/24/20 1520 106/67 98.5 F (36.9 C) (!) 112 16 100 % --  02/24/20 1517 -- -- -- -- -- 73.9 kg   Results for orders placed or performed during the hospital encounter of 02/24/20 (from the past 24 hour(s))  Urinalysis, Routine w reflex microscopic Urine,  Clean Catch     Status: Abnormal   Collection Time: 02/24/20  3:57 PM  Result Value Ref Range   Color, Urine YELLOW YELLOW   APPearance HAZY (A) CLEAR   Specific Gravity, Urine 1.016 1.005 - 1.030   pH 6.0 5.0 - 8.0   Glucose, UA 50 (A) NEGATIVE mg/dL   Hgb urine dipstick SMALL (A) NEGATIVE   Bilirubin Urine NEGATIVE NEGATIVE   Ketones, ur NEGATIVE NEGATIVE mg/dL   Protein, ur 30 (A) NEGATIVE mg/dL   Nitrite NEGATIVE NEGATIVE   Leukocytes,Ua SMALL (A) NEGATIVE   RBC / HPF 21-50 0 - 5 RBC/hpf   WBC, UA 0-5 0 - 5 WBC/hpf   Bacteria, UA RARE (A) NONE SEEN   Squamous Epithelial / LPF 0-5 0 - 5   Mucus PRESENT   Amnisure rupture of membrane (rom)not at [facility]     Status: None   Collection Time: 02/24/20  4:07 PM  Result Value Ref Range   Amnisure ROM NEGATIVE   Wet prep, genital     Status: Abnormal   Collection Time: 02/24/20  4:07 PM  Result Value Ref Range   Yeast Wet Prep HPF POC PRESENT (A) NONE SEEN   Trich, Wet Prep NONE SEEN NONE SEEN   Clue Cells Wet Prep HPF POC NONE SEEN NONE SEEN   WBC, Wet Prep HPF POC MANY (A) NONE SEEN   Sperm NONE SEEN    Meds ordered this encounter  Medications  . acetaminophen (TYLENOL) tablet 1,000 mg  . cyclobenzaprine (FLEXERIL) tablet 10 mg  . cyclobenzaprine (FLEXERIL) 10 MG tablet    Sig: Take 1 tablet (10 mg total) by mouth 2 (two) times daily as needed for muscle spasms.    Dispense:  20 tablet    Refill:  0    Order Specific Question:   Supervising Provider    Answer:   Sloan Leiter [[id number]]  . terconazole (TERAZOL 7) 0.4 % vaginal cream    Sig: Place 1 applicator vaginally at bedtime. Use for seven days    Dispense:  45 g    Refill:  0    Order Specific Question:   Supervising Provider    Answer:   Sloan Leiter [[id number]]    Assessment and Plan  --24 y.o. [phone] with di/di twins at [redacted]w[redacted]d --Reassuring MFM OB Limited x 2 --Closed cervix --Pubic symphysis pain in twin pregnancy --Vulvovaginal Candidiasis --Advised  maternity belt as previously prescribed --Discomfort improved with treatments given in MAU --Discharge home in stable condition  SDarlina Rumpf CNM 02/24/2020, 6:17 PM

## 2020-02-24 NOTE — MAU Note (Signed)
Pt reports sharp pain to her vagina for the last two days.   Denies vaginal bleeding. Pt reports a slow leak of clear fluid from her vagina since yesterday.

## 2020-02-25 LAB — CULTURE, OB URINE: Culture: <10000 — AB

## 2020-02-25 LAB — GC/CHLAMYDIA PROBE AMP (~~LOC~~) NOT AT ARMC
Chlamydia: NEGATIVE
Comment: NEGATIVE
Comment: NORMAL
Neisseria Gonorrhea: NEGATIVE

## 2020-02-26 ENCOUNTER — Other Ambulatory Visit (INDEPENDENT_AMBULATORY_CARE_PROVIDER_SITE_OTHER): Payer: Self-pay

## 2020-03-03 ENCOUNTER — Telehealth: Payer: Self-pay

## 2020-03-03 ENCOUNTER — Other Ambulatory Visit: Payer: Self-pay | Admitting: *Deleted

## 2020-03-03 MED ORDER — TERCONAZOLE 0.4 % VA CREA
1.0000 | TOPICAL_CREAM | Freq: Every day | VAGINAL | 0 refills | Status: DC
Start: 2020-03-03 — End: 2020-03-03

## 2020-03-03 MED ORDER — TERCONAZOLE 0.4 % VA CREA: 1.0000 | TOPICAL_CREAM | Freq: Every day | VAGINAL | 0 refills | Status: DC

## 2020-03-03 MED ORDER — TERCONAZOLE 0.4 % VA CREA
1.0000 | TOPICAL_CREAM | Freq: Every day | VAGINAL | 0 refills | Status: DC
Start: 2020-03-03 — End: 2020-04-02

## 2020-03-03 NOTE — Telephone Encounter (Signed)
VM left on nurse line by [facility] staff member named Drue Stager requesting completion of a prior authorization for this pt. Routed to correct office.

## 2020-03-03 NOTE — Progress Notes (Unsigned)
Change in terazol order to 14 day supply in order to be covered by ins.

## 2020-03-05 ENCOUNTER — Other Ambulatory Visit: Payer: Self-pay

## 2020-03-05 ENCOUNTER — Ambulatory Visit (INDEPENDENT_AMBULATORY_CARE_PROVIDER_SITE_OTHER): Payer: Medicaid Other | Admitting: Obstetrics & Gynecology

## 2020-03-05 ENCOUNTER — Other Ambulatory Visit: Payer: Self-pay | Admitting: Obstetrics & Gynecology

## 2020-03-05 ENCOUNTER — Other Ambulatory Visit: Payer: Medicaid Other

## 2020-03-05 VITALS — BP 116/83 | HR 103 | Wt 166.0 lb

## 2020-03-05 DIAGNOSIS — Z3A27 27 weeks gestation of pregnancy: Secondary | ICD-10-CM

## 2020-03-05 DIAGNOSIS — O0990 Supervision of high risk pregnancy, unspecified, unspecified trimester: Secondary | ICD-10-CM

## 2020-03-05 DIAGNOSIS — O99019 Anemia complicating pregnancy, unspecified trimester: Secondary | ICD-10-CM

## 2020-03-05 NOTE — Progress Notes (Signed)
   PRENATAL VISIT NOTE  Subjective:  Haley Potts is a 24 y.o. [phone] at [redacted]w[redacted]d being seen today for ongoing prenatal care.  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  Patient reports occasional contractions.  Contractions: Not present. Vag. Bleeding: None.  Movement: Present. Denies leaking of fluid.   The following portions of the patient's history were reviewed and updated as appropriate: allergies, current medications, past family history, past medical history, past social history, past surgical history and problem list.   Objective:   Vitals:   03/05/20 0901  BP: 116/83  Pulse: (!) 103  Weight: 166 lb (75.3 kg)    Fetal Status: Fetal Heart Rate (bpm): 155, 146   Movement: Present     General:  Alert, oriented and cooperative. Patient is in no acute distress.  Skin: Skin is warm and dry. No rash noted.   Cardiovascular: Normal heart rate noted  Respiratory: Normal respiratory effort, no problems with respiration noted  Abdomen: Soft, gravid, appropriate for gestational age.  Pain/Pressure: Present     Pelvic: Cervical exam deferred        Extremities: Normal range of motion.  Edema: Trace  Mental Status: Normal mood and affect. Normal behavior. Normal judgment and thought content.   Assessment and Plan:  Pregnancy: [id number] at [redacted]w[redacted]d 1. Supervision of high risk pregnancy, antepartum routine testing - Glucose Tolerance, 2 Hours w/1 Hour - HIV Antibody (routine testing w rflx) - RPR - CBC  2. [redacted] weeks gestation of pregnancy twins  Preterm labor symptoms and general obstetric precautions including but not limited to vaginal bleeding, contractions, leaking of fluid and fetal movement were reviewed in detail with the patient. Please refer to After Visit Summary for other  counseling recommendations.   Return in about 2 weeks (around 03/19/2020).  Future Appointments  Date Time Provider Department Center  03/20/2020  2:00 PM [facility] NURSE [facility]  03/20/2020  2:00 PM [facility] US1 [facility]    Scheryl Darter, MD

## 2020-03-05 NOTE — Progress Notes (Signed)
Pt is here for ROB and 2 hr GTT, DiDi twins, [redacted]w[redacted]d.

## 2020-03-05 NOTE — Patient Instructions (Signed)
Postpartum Tubal Ligation Postpartum tubal ligation (PPTL) is a procedure to close the fallopian tubes. This is done so that you cannot get pregnant. When the fallopian tubes are closed, the eggs that the ovaries release cannot enter the uterus, and sperm cannot reach the eggs. PPTL is done right after childbirth or 1-2 days after childbirth, before the uterus returns to its normal location. If you have a cesarean section, it can be performed at the same time as the procedure. Having this done after childbirth does not make your stay in the hospital longer. PPTL is sometimes called "getting your tubes tied." You should not have this procedure if you want to get pregnant again or if you are unsure about having more children. Tell a health care provider about:  Any allergies you have.  All medicines you are taking, including vitamins, herbs, eye drops, creams, and over-the-counter medicines.  Any problems you or family members have had with anesthetic medicines.  Any blood disorders you have.  Any surgeries you have had.  Any medical conditions you have or have had.  Any past pregnancies. What are the risks? Generally, this is a safe procedure. However, problems may occur, including:  Infection.  Bleeding.  Injury to other organs in the abdomen.  Side effects from anesthetic medicines.  Failure of the procedure. If this happens, you could get pregnant.  Having a fertilized egg attach outside the uterus (ectopic pregnancy). What happens before the procedure?  Ask your health care provider about: ? How much pain you can expect to have. ? What medicines you will be given for pain, especially if you are planning to breastfeed. What happens during the procedure? If you had a vaginal delivery:  You will be given one or more of the following: ? A medicine to help you relax (sedative). ? A medicine to numb the area (local anesthetic). ? A medicine to make you fall asleep (general  anesthetic). ? A medicine that is injected into an area of your body to numb everything below the injection site (regional anesthetic).  If you have been given a general anesthetic, a tube will be put down your throat to help you breathe.  An IV will be inserted into one of your veins.  Your bladder may be emptied with a small tube (catheter).  An incision will be made just below your belly button.  Your fallopian tubes will be located and brought up through the incision.  Your fallopian tubes will be tied off, burned (cauterized), or blocked with a clip, ring, or clamp. A small part in the center of each fallopian tube may be removed.  The incision will be closed with stitches (sutures).  A bandage (dressing) will be placed over the incision. If you had a cesarean delivery:  Tubal ligation will be done through the incision that was used for the cesarean delivery of your baby.  The incision will be closed with sutures.  A dressing will be placed over the incision. The procedure may vary among health care providers and hospitals. What happens after the procedure?  Your blood pressure, heart rate, breathing rate, and blood oxygen level will be monitored until you leave the hospital.  You will be given pain medicine as needed.  Do not drive for 24 hours if you were given a sedative during your procedure. Summary  Postpartum tubal ligation is a procedure that closes the fallopian tubes so you cannot get pregnant anymore.  This procedure is done while you are still   in the hospital after childbirth. If you have a cesarean section, it can be performed at the same time.  Having this done after childbirth does not make your stay in the hospital longer.  Postpartum tubal ligation is considered permanent. You should not have this procedure if you want to get pregnant again or if you are unsure about having more children.  Talk to your health care provider to see if this procedure is  right for you. This information is not intended to replace advice given to you by your health care provider. Make sure you discuss any questions you have with your health care provider. Document Revised: 12/03/2018 Document Reviewed: 05/10/2018 Elsevier Patient Education  2020 Elsevier Inc.  

## 2020-03-06 LAB — CBC
Hematocrit: 25.8 % — ABNORMAL LOW (ref 34.0–46.6)
Hemoglobin: 7.8 g/dL — ABNORMAL LOW (ref 11.1–15.9)
MCH: 23.1 pg — ABNORMAL LOW (ref 26.6–33.0)
MCHC: 30.2 g/dL — ABNORMAL LOW (ref 31.5–35.7)
MCV: 76 fL — ABNORMAL LOW (ref 79–97)
Platelets: 277 10*3/uL (ref 150–450)
RBC: 3.38 x10E6/uL — ABNORMAL LOW (ref 3.77–5.28)
RDW: 14.7 % (ref 11.7–15.4)
WBC: 6.6 10*3/uL (ref 3.4–10.8)

## 2020-03-06 LAB — GLUCOSE TOLERANCE, 2 HOURS W/ 1HR
Glucose, 1 hour: 124 mg/dL (ref 65–179)
Glucose, 2 hour: 92 mg/dL (ref 65–152)
Glucose, Fasting: 85 mg/dL (ref 65–91)

## 2020-03-06 LAB — HIV ANTIBODY (ROUTINE TESTING W REFLEX): HIV Screen 4th Generation wRfx: NONREACTIVE

## 2020-03-06 LAB — RPR: RPR Ser Ql: NONREACTIVE

## 2020-03-16 DIAGNOSIS — O99019 Anemia complicating pregnancy, unspecified trimester: Secondary | ICD-10-CM | POA: Insufficient documentation

## 2020-03-19 ENCOUNTER — Ambulatory Visit (INDEPENDENT_AMBULATORY_CARE_PROVIDER_SITE_OTHER): Payer: Medicaid Other | Admitting: Certified Nurse Midwife

## 2020-03-19 ENCOUNTER — Other Ambulatory Visit: Payer: Self-pay

## 2020-03-19 ENCOUNTER — Encounter: Payer: Self-pay | Admitting: Certified Nurse Midwife

## 2020-03-19 VITALS — BP 105/62 | HR 90 | Wt 163.6 lb

## 2020-03-19 DIAGNOSIS — O0990 Supervision of high risk pregnancy, unspecified, unspecified trimester: Secondary | ICD-10-CM

## 2020-03-19 DIAGNOSIS — Z3A29 29 weeks gestation of pregnancy: Secondary | ICD-10-CM

## 2020-03-19 DIAGNOSIS — O30043 Twin pregnancy, dichorionic/diamniotic, third trimester: Secondary | ICD-10-CM

## 2020-03-19 DIAGNOSIS — O99019 Anemia complicating pregnancy, unspecified trimester: Secondary | ICD-10-CM

## 2020-03-19 NOTE — Progress Notes (Signed)
   PRENATAL VISIT NOTE  Subjective:  Haley Potts is a 24 y.o. [phone] at [redacted]w[redacted]d being seen today for ongoing prenatal care.  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  Patient reports no complaints.  Contractions: Not present. Vag. Bleeding: None.  Movement: Present. Denies leaking of fluid.   The following portions of the patient's history were reviewed and updated as appropriate: allergies, current medications, past family history, past medical history, past social history, past surgical history and problem list.   Objective:   Vitals:   03/19/20 1353  BP: 105/62  Pulse: 90  Weight: 163 lb 9.6 oz (74.2 kg)    Fetal Status: Fetal Heart Rate (bpm): 156,145 Fundal Height: 33 cm Movement: Present     General:  Alert, oriented and cooperative. Patient is in no acute distress.  Skin: Skin is warm and dry. No rash noted.   Cardiovascular: Normal heart rate noted  Respiratory: Normal respiratory effort, no problems with respiration noted  Abdomen: Soft, gravid, appropriate for gestational age.  Pain/Pressure: Absent     Pelvic: Cervical exam deferred        Extremities: Normal range of motion.  Edema: Trace  Mental Status: Normal mood and affect. Normal behavior. Normal judgment and thought content.   Assessment and Plan:  Pregnancy: [id number] at [redacted]w[redacted]d 1. Supervision of high risk pregnancy, antepartum - patient doing well, no complaints  - routine prenatal care - third trimester labs reviewed  - patient reports that she has loss appetite, encouraged patient to eat small meals with snacks and add protein shakes for loss of appetite and if patient does not want full meal  - Anticipatory guidance on upcoming appointments   2. Anemia during pregnancy - discussed with patient  fereheme infusions  - orders placed for fereheme and patient scheduled for next week   3. Dichorionic diamniotic twin pregnancy in third trimester - concordant growth for twin gestation based on [place] from 02/20/20 - follow up fetal growth [place] with MFM tomorrow   4. [redacted] weeks gestation of pregnancy  Preterm labor symptoms and general obstetric precautions including but not limited to vaginal bleeding, contractions, leaking of fluid and fetal movement were reviewed in detail with the patient. Please refer to After Visit Summary for other counseling recommendations.   Return in about 2 weeks (around 04/02/2020) for HROB.  Future Appointments  Date Time Provider Department Center  03/20/2020  2:00 PM [facility] NURSE [facility]  03/20/2020  2:00 PM [facility] US1 [facility]  03/25/2020  1:00 PM [facility] None  04/02/2020  1:30 PM Adam Phenix, MD [facility] None    Sharyon Cable, CNM

## 2020-03-19 NOTE — Progress Notes (Signed)
Patient reports fetal movement, denies pain today. 

## 2020-03-19 NOTE — Patient Instructions (Signed)

## 2020-03-20 ENCOUNTER — Ambulatory Visit: Payer: Medicaid Other | Attending: Obstetrics and Gynecology

## 2020-03-20 ENCOUNTER — Other Ambulatory Visit: Payer: Self-pay | Admitting: *Deleted

## 2020-03-20 ENCOUNTER — Encounter: Payer: Self-pay | Admitting: *Deleted

## 2020-03-20 ENCOUNTER — Ambulatory Visit: Payer: Medicaid Other | Admitting: *Deleted

## 2020-03-20 DIAGNOSIS — O30049 Twin pregnancy, dichorionic/diamniotic, unspecified trimester: Secondary | ICD-10-CM | POA: Insufficient documentation

## 2020-03-20 DIAGNOSIS — Z148 Genetic carrier of other disease: Secondary | ICD-10-CM

## 2020-03-20 DIAGNOSIS — Z3A29 29 weeks gestation of pregnancy: Secondary | ICD-10-CM | POA: Diagnosis not present

## 2020-03-20 DIAGNOSIS — O30043 Twin pregnancy, dichorionic/diamniotic, third trimester: Secondary | ICD-10-CM | POA: Diagnosis not present

## 2020-03-20 DIAGNOSIS — O0990 Supervision of high risk pregnancy, unspecified, unspecified trimester: Secondary | ICD-10-CM

## 2020-03-24 NOTE — Discharge Instructions (Signed)

## 2020-03-25 ENCOUNTER — Other Ambulatory Visit: Payer: Self-pay

## 2020-03-25 ENCOUNTER — Encounter (HOSPITAL_COMMUNITY)
Admission: RE | Admit: 2020-03-25 | Discharge: 2020-03-25 | Disposition: A | Discharge status: Home / Self Care | Payer: Medicaid Other | Source: Ambulatory Visit | Attending: Obstetrics and Gynecology | Admitting: Obstetrics and Gynecology

## 2020-03-25 DIAGNOSIS — O99013 Anemia complicating pregnancy, third trimester: Secondary | ICD-10-CM | POA: Diagnosis not present

## 2020-03-25 DIAGNOSIS — Z3A29 29 weeks gestation of pregnancy: Secondary | ICD-10-CM | POA: Insufficient documentation

## 2020-03-25 MED ORDER — SODIUM CHLORIDE 0.9 % IV SOLN
510.0000 mg | INTRAVENOUS | Status: DC
  Administered 2020-03-25: 510 mg via INTRAVENOUS
  Filled 2020-03-25: qty 17

## 2020-04-01 ENCOUNTER — Encounter (HOSPITAL_COMMUNITY)
Admission: RE | Admit: 2020-04-01 | Discharge: 2020-04-01 | Disposition: A | Discharge status: Home / Self Care | Payer: Medicaid Other | Source: Ambulatory Visit | Attending: Obstetrics and Gynecology | Admitting: Obstetrics and Gynecology

## 2020-04-01 ENCOUNTER — Other Ambulatory Visit: Payer: Self-pay

## 2020-04-01 DIAGNOSIS — O99013 Anemia complicating pregnancy, third trimester: Secondary | ICD-10-CM | POA: Diagnosis not present

## 2020-04-01 DIAGNOSIS — Z3A29 29 weeks gestation of pregnancy: Secondary | ICD-10-CM | POA: Diagnosis not present

## 2020-04-01 MED ORDER — SODIUM CHLORIDE 0.9 % IV SOLN
510.0000 mg | INTRAVENOUS | Status: DC
  Administered 2020-04-01: 510 mg via INTRAVENOUS
  Filled 2020-04-01: qty 17

## 2020-04-02 ENCOUNTER — Other Ambulatory Visit: Payer: Self-pay

## 2020-04-02 ENCOUNTER — Ambulatory Visit (INDEPENDENT_AMBULATORY_CARE_PROVIDER_SITE_OTHER): Payer: Medicaid Other | Admitting: Obstetrics & Gynecology

## 2020-04-02 VITALS — BP 108/72 | HR 90 | Wt 161.3 lb

## 2020-04-02 DIAGNOSIS — O99019 Anemia complicating pregnancy, unspecified trimester: Secondary | ICD-10-CM

## 2020-04-02 DIAGNOSIS — O30043 Twin pregnancy, dichorionic/diamniotic, third trimester: Secondary | ICD-10-CM

## 2020-04-02 DIAGNOSIS — O0990 Supervision of high risk pregnancy, unspecified, unspecified trimester: Secondary | ICD-10-CM

## 2020-04-02 NOTE — Progress Notes (Signed)
Patient presents for ROB. Patient has no concerns today. 

## 2020-04-02 NOTE — Patient Instructions (Signed)

## 2020-04-14 ENCOUNTER — Other Ambulatory Visit: Payer: Self-pay

## 2020-04-14 ENCOUNTER — Ambulatory Visit: Payer: Medicaid Other | Admitting: *Deleted

## 2020-04-14 ENCOUNTER — Ambulatory Visit: Payer: Medicaid Other | Attending: Obstetrics

## 2020-04-14 ENCOUNTER — Encounter: Payer: Self-pay | Admitting: *Deleted

## 2020-04-14 DIAGNOSIS — O30049 Twin pregnancy, dichorionic/diamniotic, unspecified trimester: Secondary | ICD-10-CM | POA: Insufficient documentation

## 2020-04-14 DIAGNOSIS — Z3A33 33 weeks gestation of pregnancy: Secondary | ICD-10-CM

## 2020-04-14 DIAGNOSIS — O30043 Twin pregnancy, dichorionic/diamniotic, third trimester: Secondary | ICD-10-CM

## 2020-04-14 DIAGNOSIS — O0990 Supervision of high risk pregnancy, unspecified, unspecified trimester: Secondary | ICD-10-CM | POA: Diagnosis not present

## 2020-04-14 DIAGNOSIS — Z148 Genetic carrier of other disease: Secondary | ICD-10-CM

## 2020-04-15 ENCOUNTER — Other Ambulatory Visit: Payer: Self-pay | Admitting: *Deleted

## 2020-04-15 ENCOUNTER — Ambulatory Visit (INDEPENDENT_AMBULATORY_CARE_PROVIDER_SITE_OTHER): Payer: Medicaid Other | Admitting: Obstetrics & Gynecology

## 2020-04-15 VITALS — BP 105/62 | HR 87 | Wt 166.0 lb

## 2020-04-15 DIAGNOSIS — O30043 Twin pregnancy, dichorionic/diamniotic, third trimester: Secondary | ICD-10-CM

## 2020-04-15 DIAGNOSIS — O30049 Twin pregnancy, dichorionic/diamniotic, unspecified trimester: Secondary | ICD-10-CM

## 2020-04-15 DIAGNOSIS — O99019 Anemia complicating pregnancy, unspecified trimester: Secondary | ICD-10-CM

## 2020-04-15 DIAGNOSIS — O0990 Supervision of high risk pregnancy, unspecified, unspecified trimester: Secondary | ICD-10-CM

## 2020-04-15 NOTE — Progress Notes (Signed)
Pt had u/s yesterday.  Pt had iron infusions on 9/22 and 9/29,  ?repeat CBC.

## 2020-04-15 NOTE — Patient Instructions (Signed)

## 2020-04-15 NOTE — Progress Notes (Signed)
   PRENATAL VISIT NOTE  Subjective:  Haley Potts is a 24 y.o. [phone] at [redacted]w[redacted]d being seen today for ongoing prenatal care.  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  Patient reports occasional contractions and left leg pain, upper thigh.  Contractions: Irregular. Vag. Bleeding: None.  Movement: Present. Denies leaking of fluid.   The following portions of the patient's history were reviewed and updated as appropriate: allergies, current medications, past family history, past medical history, past social history, past surgical history and problem list.   Objective:   Vitals:   04/15/20 1455  BP: 105/62  Pulse: 87  Weight: 166 lb (75.3 kg)    Fetal Status: Fetal Heart Rate (bpm): 155/160   Movement: Present     General:  Alert, oriented and cooperative. Patient is in no acute distress.  Skin: Skin is warm and dry. No rash noted.   Cardiovascular: Normal heart rate noted  Respiratory: Normal respiratory effort, no problems with respiration noted  Abdomen: Soft, gravid, appropriate for gestational age.  Pain/Pressure: Present     Pelvic: Cervical exam deferred        Extremities: Normal range of motion.     Mental Status: Normal mood and affect. Normal behavior. Normal judgment and thought content.   Assessment and Plan:  Pregnancy: [id number] at [redacted]w[redacted]d 1. Anemia during pregnancy S/p Fe infusion, f/u CBC in one week  2. Dichorionic diamniotic twin pregnancy in third trimester [place] concordant growth and VTX/VTX  3. Supervision of high risk pregnancy, antepartum GBS next visit, delivery 37-38 weeks  Preterm labor symptoms and general obstetric precautions including but not limited to vaginal bleeding, contractions, leaking of fluid and fetal movement were reviewed in detail  with the patient. Please refer to After Visit Summary for other counseling recommendations.   Return in about 1 week (around 04/22/2020) for GBS.  Future Appointments  Date Time Provider Department Center  05/13/2020  2:30 PM [facility] NURSE [facility]  05/13/2020  2:45 PM [facility] US5 [facility]    Scheryl Darter, MD

## 2020-04-22 ENCOUNTER — Other Ambulatory Visit: Payer: Self-pay

## 2020-04-22 ENCOUNTER — Ambulatory Visit (INDEPENDENT_AMBULATORY_CARE_PROVIDER_SITE_OTHER): Payer: Medicaid Other | Admitting: Obstetrics and Gynecology

## 2020-04-22 VITALS — BP 113/73 | HR 76 | Wt 168.3 lb

## 2020-04-22 DIAGNOSIS — O0990 Supervision of high risk pregnancy, unspecified, unspecified trimester: Secondary | ICD-10-CM

## 2020-04-22 DIAGNOSIS — O30043 Twin pregnancy, dichorionic/diamniotic, third trimester: Secondary | ICD-10-CM

## 2020-04-22 DIAGNOSIS — Z148 Genetic carrier of other disease: Secondary | ICD-10-CM

## 2020-04-22 DIAGNOSIS — Z3A34 34 weeks gestation of pregnancy: Secondary | ICD-10-CM | POA: Insufficient documentation

## 2020-04-22 NOTE — Patient Instructions (Addendum)
National Guideline Alliance ([place]).Twin and Triplet Pregnancy. [place]: National Institute for Health and Care Excellence ([place]); 2019.">  Multiple Pregnancy Multiple pregnancy means that a woman is carrying more than one baby at a time. She may be pregnant with twins, triplets, or more. The majority of multiple pregnancies are twins. Naturally conceiving triplets or more (higher-order multiples) is rare. Multiple pregnancies are riskier than single pregnancies. A woman with a multiple pregnancy is more likely to have certain problems during her pregnancy. How does a multiple pregnancy happen? A multiple pregnancy happens when:  The woman's body releases more than one egg at a time, and then each egg gets fertilized by a different sperm. ? This is the most common type of multiple pregnancy. ? Twins or other multiples produced this way are called fraternal. They are no more alike than non-multiple siblings are.  One sperm fertilizes one egg, which then divides into more than one embryo. ? Twins or other multiples produced this way are called identical. Identical multiples are always the same gender, and they look very much alike. Who is most likely to have a multiple pregnancy? A multiple pregnancy is more likely to develop in women who:  Have had fertility treatment, especially if the treatment included fertility medicines.  Are older than 23 years of age.  Have already had four or more children.  Have a family history of multiple pregnancy. How is a multiple pregnancy diagnosed? A multiple pregnancy may be diagnosed based on:  Symptoms such as: ? Rapid weight gain in the first 3 months of pregnancy (first trimester). ? More severe nausea and breast tenderness than what is typical of a single pregnancy. ? A larger uterus than what is normal for the stage of the pregnancy.  Blood tests that detect a higher-than-normal level of human chorionic gonadotropin (hCG). This is a hormone that your  body produces in early pregnancy.  An ultrasound exam. This is used to confirm that you are carrying multiples. What risks come with multiple pregnancy? A multiple pregnancy puts you at a higher risk for certain problems during or after your pregnancy. These include:  Delivering your babies before your due date (preterm birth). A full-term pregnancy lasts for at least 37 weeks. ? Babies born before 37 weeks may have a higher risk for breathing problems, feeding difficulties, cerebral palsy, and learning disabilities.  Diabetes.  Preeclampsia. This is a serious condition that causes high blood pressure and headaches during pregnancy.  Too much blood loss after childbirth (postpartum hemorrhage).  Postpartum depression.  Low birth weight of the babies. How will having a multiple pregnancy affect my care? Your health care team will monitor you more closely. You may need more frequent prenatal visits. This will ensure that you are healthy and that your babies are growing normally. Follow these instructions at home: Eating and drinking  Increase your nutrition. ? Follow your health care provider's recommendations for weight gain. You may need to gain a little extra weight when you are pregnant with multiples. ? Eat healthy snacks often throughout the day. This will add calories and reduce nausea.  Drink enough fluid to keep your urine pale yellow.  Take prenatal vitamins. Ask your health care provider what vitamins are right for you. Activity Limit your activities by 20-24 weeks of pregnancy.  Rest often.  Avoid activities, exercise, and work that take a lot of effort.  Ask your health care provider when you should stop having sex. General instructions  Do not use any   products that contain nicotine or tobacco, such as cigarettes, e-cigarettes, and chewing tobacco. If you need help quitting, ask your health care provider.  Do not drink alcohol or use illegal drugs.  Take  over-the-counter and prescription medicines only as told by your health care provider.  Arrange for extra help around the house.  Keep all follow-up visits and all prenatal visits as told by your health care provider. This is important. Where to find more information  [employer]: www.acog.org Contact a health care provider if:  You have dizziness.  You have nausea, vomiting, or diarrhea that does not go away.  You have depression or other emotions that are interfering with your normal activities.  You have a fever.  You have pain with urination.  You have a bad-smelling vaginal discharge.  You notice increased swelling in your face, hands, legs, or ankles. Get help right away if:  You have fluid leaking from your vagina.  You have bleeding from your vagina.  You have pelvic cramps, pelvic pressure, or nagging pain in your abdomen or lower back.  You are having regular contractions.  You have a severe headache, with or without changes in how you see.  You have chest pain or shortness of breath.  You notice that your babies move less often, or do not move at all. Summary  Having a multiple pregnancy means that a woman is carrying more than one baby at a time.  A multiple pregnancy puts you at a higher risk for delivering your babies before your due date, having diabetes, preeclampsia, too much blood loss after childbirth, or low birth weight of the babies.  Your health care provider will monitor you more closely during your pregnancy.  You may need to make some lifestyle changes during pregnancy. This includes eating more, limiting your activities after 20-24 weeks of pregnancy, and arranging for extra help around the house.  Follow up with your health care provider as instructed if you experience any complications. This information is not intended to replace advice given to you by your health care provider. Make sure you discuss  any questions you have with your health care provider. Document Revised: 02/11/2019 Document Reviewed: 02/11/2019 Elsevier Patient Education  2020 Elsevier Inc.  Postpartum Tubal Ligation Postpartum tubal ligation (PPTL) is a procedure to close the fallopian tubes. This is done so that you cannot get pregnant. When the fallopian tubes are closed, the eggs that the ovaries release cannot enter the uterus, and sperm cannot reach the eggs. PPTL is done right after childbirth or 1-2 days after childbirth, before the uterus returns to its normal location. If you have a cesarean section, it can be performed at the same time as the procedure. Having this done after childbirth does not make your stay in the hospital longer. PPTL is sometimes called "getting your tubes tied." You should not have this procedure if you want to get pregnant again or if you are unsure about having more children. Tell a health care provider about:  Any allergies you have.  All medicines you are taking, including vitamins, herbs, eye drops, creams, and over-the-counter medicines.  Any problems you or family members have had with anesthetic medicines.  Any blood disorders you have.  Any surgeries you have had.  Any medical conditions you have or have had.  Any past pregnancies. What are the risks? Generally, this is a safe procedure. However, problems may occur, including:  Infection.  Bleeding.  Injury to  other organs in the abdomen.  Side effects from anesthetic medicines.  Failure of the procedure. If this happens, you could get pregnant.  Having a fertilized egg attach outside the uterus (ectopic pregnancy). What happens before the procedure?  Ask your health care provider about: ? How much pain you can expect to have. ? What medicines you will be given for pain, especially if you are planning to breastfeed. What happens during the procedure? If you had a vaginal delivery:  You will be given one or  more of the following: ? A medicine to help you relax (sedative). ? A medicine to numb the area (local anesthetic). ? A medicine to make you fall asleep (general anesthetic). ? A medicine that is injected into an area of your body to numb everything below the injection site (regional anesthetic).  If you have been given a general anesthetic, a tube will be put down your throat to help you breathe.  An IV will be inserted into one of your veins.  Your bladder may be emptied with a small tube (catheter).  An incision will be made just below your belly button.  Your fallopian tubes will be located and brought up through the incision.  Your fallopian tubes will be tied off, burned (cauterized), or blocked with a clip, ring, or clamp. A small part in the center of each fallopian tube may be removed.  The incision will be closed with stitches (sutures).  A bandage (dressing) will be placed over the incision. If you had a cesarean delivery:  Tubal ligation will be done through the incision that was used for the cesarean delivery of your baby.  The incision will be closed with sutures.  A dressing will be placed over the incision. The procedure may vary among health care providers and hospitals. What happens after the procedure?  Your blood pressure, heart rate, breathing rate, and blood oxygen level will be monitored until you leave the hospital.  You will be given pain medicine as needed.  Do not drive for 24 hours if you were given a sedative during your procedure. Summary  Postpartum tubal ligation is a procedure that closes the fallopian tubes so you cannot get pregnant anymore.  This procedure is done while you are still in the hospital after childbirth. If you have a cesarean section, it can be performed at the same time.  Having this done after childbirth does not make your stay in the hospital longer.  Postpartum tubal ligation is considered permanent. You should not have  this procedure if you want to get pregnant again or if you are unsure about having more children.  Talk to your health care provider to see if this procedure is right for you. This information is not intended to replace advice given to you by your health care provider. Make sure you discuss any questions you have with your health care provider. Document Revised: 12/03/2018 Document Reviewed: 05/10/2018 Elsevier Patient Education  2020 [employer].

## 2020-04-22 NOTE — Progress Notes (Signed)
Pt reports fetal movement with pressure and some contractions.

## 2020-04-22 NOTE — Progress Notes (Signed)
   PRENATAL VISIT NOTE  Subjective:  Haley Potts is a 24 y.o. [phone] at [redacted]w[redacted]d being seen today for ongoing prenatal care.  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  Patient doing well with no acute concerns today. She reports occasional contractions.  Contractions: Irregular. Vag. Bleeding: None.  Movement: Present. Denies leaking of fluid.   The following portions of the patient's history were reviewed and updated as appropriate: allergies, current medications, past family history, past medical history, past social history, past surgical history and problem list. Problem list updated.  Objective:   Vitals:   04/22/20 0835  BP: 113/73  Pulse: 76  Weight: 168 lb 4.8 oz (76.3 kg)    Fetal Status: Fetal Heart Rate (bpm): 147/140 Fundal Height: 43 cm Movement: Present     General:  Alert, oriented and cooperative. Patient is in no acute distress.  Skin: Skin is warm and dry. No rash noted.   Cardiovascular: Normal heart rate noted  Respiratory: Normal respiratory effort, no problems with respiration noted  Abdomen: Soft, gravid, appropriate for gestational age.  Pain/Pressure: Present     Pelvic: Cervical exam deferred        Extremities: Normal range of motion.  Edema: Trace  Mental Status:  Normal mood and affect. Normal behavior. Normal judgment and thought content.   Assessment and Plan:  Pregnancy: [id number] at [redacted]w[redacted]d  1. Supervision of high risk pregnancy, antepartum Pt seen by MFM, repeat growth scan 05/13/20 Per pt her BTL forms are signed and she would prefer postpartum tubal  2. 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 Dichorionic diamniotic twin pregnancy in third trimester 2.5% discordance, vertex/vertex  4. [redacted] weeks gestation of  pregnancy   Preterm labor symptoms and general obstetric precautions including but not limited to vaginal bleeding, contractions, leaking of fluid and fetal movement were reviewed in detail with the patient.  Please refer to After Visit Summary for other counseling recommendations.   Return in about 2 weeks (around 05/06/2020) for [facility], in person, 36 weeks swabs.   Mariel Aloe, MD

## 2020-04-29 ENCOUNTER — Encounter: Payer: Self-pay | Admitting: Obstetrics and Gynecology

## 2020-04-29 ENCOUNTER — Ambulatory Visit (INDEPENDENT_AMBULATORY_CARE_PROVIDER_SITE_OTHER): Payer: Medicaid Other | Admitting: Obstetrics and Gynecology

## 2020-04-29 ENCOUNTER — Other Ambulatory Visit: Payer: Self-pay

## 2020-04-29 VITALS — BP 117/65 | HR 102 | Wt 171.0 lb

## 2020-04-29 DIAGNOSIS — Z3A35 35 weeks gestation of pregnancy: Secondary | ICD-10-CM | POA: Diagnosis not present

## 2020-04-29 DIAGNOSIS — Z148 Genetic carrier of other disease: Secondary | ICD-10-CM | POA: Diagnosis not present

## 2020-04-29 DIAGNOSIS — O30043 Twin pregnancy, dichorionic/diamniotic, third trimester: Secondary | ICD-10-CM | POA: Diagnosis not present

## 2020-04-29 DIAGNOSIS — O0990 Supervision of high risk pregnancy, unspecified, unspecified trimester: Secondary | ICD-10-CM | POA: Diagnosis not present

## 2020-04-29 NOTE — Progress Notes (Signed)
   PRENATAL VISIT NOTE  Subjective:  Haley Potts is a 24 y.o. [phone] at [redacted]w[redacted]d being seen today for ongoing prenatal care.  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  Patient doing well with no acute concerns today. She reports no complaints.  Contractions: Irregular. Vag. Bleeding: None.  Movement: Present. Denies leaking of fluid.   The following portions of the patient's history were reviewed and updated as appropriate: allergies, current medications, past family history, past medical history, past social history, past surgical history and problem list. Problem list updated.  Objective:   Vitals:   04/29/20 1504  BP: 117/65  Pulse: (!) 102  Weight: 171 lb (77.6 kg)    Fetal Status: Fetal Heart Rate (bpm): 138/132 (Simultaneous filing. User may not have seen previous data.) Fundal Height: 43 cm Movement: Present     General:  Alert, oriented and cooperative. Patient is in no acute distress.  Skin: Skin is warm and dry. No rash noted.   Cardiovascular: Normal heart rate noted  Respiratory: Normal respiratory effort, no problems with respiration noted  Abdomen: Soft, gravid, appropriate for gestational age.  Pain/Pressure: Present     Pelvic: Cervical exam deferred        Extremities: Normal range of motion.  Edema: Trace  Mental Status:  Normal mood and affect. Normal behavior. Normal judgment and thought content.   Assessment and Plan:  Pregnancy: [id number] at [redacted]w[redacted]d  1. [redacted] weeks gestation of pregnancy   2. Supervision of high risk pregnancy, antepartum 36 week labs and cervical exam next visit  3. 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 Dichorionic diamniotic twin pregnancy in third trimester Pt has MFM follow  up on 05/13/20  Preterm labor symptoms and general obstetric precautions including but not limited to vaginal bleeding, contractions, leaking of fluid and fetal movement were reviewed in detail with the patient.  Please refer to After Visit Summary for other counseling recommendations.   Return in about 1 week (around 05/06/2020) for [facility], in person.   Mariel Aloe, MD

## 2020-04-29 NOTE — Patient Instructions (Signed)
National Guideline Alliance ([place]).Twin and Triplet Pregnancy. [place]: National Institute for Health and Care Excellence ([place]); 2019.">  Multiple Pregnancy Multiple pregnancy means that a woman is carrying more than one baby at a time. She may be pregnant with twins, triplets, or more. The majority of multiple pregnancies are twins. Naturally conceiving triplets or more (higher-order multiples) is rare. Multiple pregnancies are riskier than single pregnancies. A woman with a multiple pregnancy is more likely to have certain problems during her pregnancy. How does a multiple pregnancy happen? A multiple pregnancy happens when:  The woman's body releases more than one egg at a time, and then each egg gets fertilized by a different sperm. ? This is the most common type of multiple pregnancy. ? Twins or other multiples produced this way are called fraternal. They are no more alike than non-multiple siblings are.  One sperm fertilizes one egg, which then divides into more than one embryo. ? Twins or other multiples produced this way are called identical. Identical multiples are always the same gender, and they look very much alike. Who is most likely to have a multiple pregnancy? A multiple pregnancy is more likely to develop in women who:  Have had fertility treatment, especially if the treatment included fertility medicines.  Are older than 23 years of age.  Have already had four or more children.  Have a family history of multiple pregnancy. How is a multiple pregnancy diagnosed? A multiple pregnancy may be diagnosed based on:  Symptoms such as: ? Rapid weight gain in the first 3 months of pregnancy (first trimester). ? More severe nausea and breast tenderness than what is typical of a single pregnancy. ? A larger uterus than what is normal for the stage of the pregnancy.  Blood tests that detect a higher-than-normal level of human chorionic gonadotropin (hCG). This is a hormone that your  body produces in early pregnancy.  An ultrasound exam. This is used to confirm that you are carrying multiples. What risks come with multiple pregnancy? A multiple pregnancy puts you at a higher risk for certain problems during or after your pregnancy. These include:  Delivering your babies before your due date (preterm birth). A full-term pregnancy lasts for at least 37 weeks. ? Babies born before 37 weeks may have a higher risk for breathing problems, feeding difficulties, cerebral palsy, and learning disabilities.  Diabetes.  Preeclampsia. This is a serious condition that causes high blood pressure and headaches during pregnancy.  Too much blood loss after childbirth (postpartum hemorrhage).  Postpartum depression.  Low birth weight of the babies. How will having a multiple pregnancy affect my care? Your health care team will monitor you more closely. You may need more frequent prenatal visits. This will ensure that you are healthy and that your babies are growing normally. Follow these instructions at home: Eating and drinking  Increase your nutrition. ? Follow your health care provider's recommendations for weight gain. You may need to gain a little extra weight when you are pregnant with multiples. ? Eat healthy snacks often throughout the day. This will add calories and reduce nausea.  Drink enough fluid to keep your urine pale yellow.  Take prenatal vitamins. Ask your health care provider what vitamins are right for you. Activity Limit your activities by 20-24 weeks of pregnancy.  Rest often.  Avoid activities, exercise, and work that take a lot of effort.  Ask your health care provider when you should stop having sex. General instructions  Do not use any   products that contain nicotine or tobacco, such as cigarettes, e-cigarettes, and chewing tobacco. If you need help quitting, ask your health care provider.  Do not drink alcohol or use illegal drugs.  Take  over-the-counter and prescription medicines only as told by your health care provider.  Arrange for extra help around the house.  Keep all follow-up visits and all prenatal visits as told by your health care provider. This is important. Where to find more information  American College of Obstetricians and Gynecology: www.acog.org Contact a health care provider if:  You have dizziness.  You have nausea, vomiting, or diarrhea that does not go away.  You have depression or other emotions that are interfering with your normal activities.  You have a fever.  You have pain with urination.  You have a bad-smelling vaginal discharge.  You notice increased swelling in your face, hands, legs, or ankles. Get help right away if:  You have fluid leaking from your vagina.  You have bleeding from your vagina.  You have pelvic cramps, pelvic pressure, or nagging pain in your abdomen or lower back.  You are having regular contractions.  You have a severe headache, with or without changes in how you see.  You have chest pain or shortness of breath.  You notice that your babies move less often, or do not move at all. Summary  Having a multiple pregnancy means that a woman is carrying more than one baby at a time.  A multiple pregnancy puts you at a higher risk for delivering your babies before your due date, having diabetes, preeclampsia, too much blood loss after childbirth, or low birth weight of the babies.  Your health care provider will monitor you more closely during your pregnancy.  You may need to make some lifestyle changes during pregnancy. This includes eating more, limiting your activities after 20-24 weeks of pregnancy, and arranging for extra help around the house.  Follow up with your health care provider as instructed if you experience any complications. This information is not intended to replace advice given to you by your health care provider. Make sure you discuss  any questions you have with your health care provider. Document Revised: 02/11/2019 Document Reviewed: 02/11/2019 Elsevier Patient Education  2020 Elsevier Inc.  

## 2020-04-29 NOTE — Progress Notes (Signed)
Pt presents for Haley Potts reports no complaints.

## 2020-04-30 ENCOUNTER — Encounter (HOSPITAL_COMMUNITY): Payer: Self-pay | Admitting: Obstetrics & Gynecology

## 2020-04-30 ENCOUNTER — Inpatient Hospital Stay (HOSPITAL_COMMUNITY)
Admission: AD | Admit: 2020-04-30 | Discharge: 2020-04-30 | Disposition: A | Discharge status: Other Acute Inpatient Hospital | Payer: Medicaid Other | Attending: Obstetrics & Gynecology | Admitting: Obstetrics & Gynecology

## 2020-04-30 ENCOUNTER — Other Ambulatory Visit: Payer: Self-pay

## 2020-04-30 ENCOUNTER — Inpatient Hospital Stay (HOSPITAL_BASED_OUTPATIENT_CLINIC_OR_DEPARTMENT_OTHER): Payer: Medicaid Other

## 2020-04-30 DIAGNOSIS — Z20822 Contact with and (suspected) exposure to covid-19: Secondary | ICD-10-CM | POA: Insufficient documentation

## 2020-04-30 DIAGNOSIS — O321XX2 Maternal care for breech presentation, fetus 2: Secondary | ICD-10-CM | POA: Diagnosis not present

## 2020-04-30 DIAGNOSIS — O30043 Twin pregnancy, dichorionic/diamniotic, third trimester: Secondary | ICD-10-CM | POA: Diagnosis not present

## 2020-04-30 DIAGNOSIS — O4703 False labor before 37 completed weeks of gestation, third trimester: Secondary | ICD-10-CM | POA: Diagnosis not present

## 2020-04-30 DIAGNOSIS — Z3A35 35 weeks gestation of pregnancy: Secondary | ICD-10-CM | POA: Insufficient documentation

## 2020-04-30 DIAGNOSIS — O4700 False labor before 37 completed weeks of gestation, unspecified trimester: Secondary | ICD-10-CM

## 2020-04-30 DIAGNOSIS — Z148 Genetic carrier of other disease: Secondary | ICD-10-CM | POA: Diagnosis not present

## 2020-04-30 DIAGNOSIS — O9902 Anemia complicating childbirth: Secondary | ICD-10-CM | POA: Diagnosis not present

## 2020-04-30 DIAGNOSIS — D6489 Other specified anemias: Secondary | ICD-10-CM | POA: Diagnosis not present

## 2020-04-30 DIAGNOSIS — O0990 Supervision of high risk pregnancy, unspecified, unspecified trimester: Secondary | ICD-10-CM

## 2020-04-30 LAB — RESPIRATORY PANEL BY RT PCR (FLU A&B, COVID)
Influenza A by PCR: NEGATIVE
Influenza B by PCR: NEGATIVE
SARS Coronavirus 2 by RT PCR: NEGATIVE

## 2020-04-30 MED ORDER — NIFEDIPINE 10 MG PO CAPS
10.0000 mg | ORAL_CAPSULE | Freq: Once | ORAL | Status: AC
  Administered 2020-04-30: 10 mg via ORAL
  Filled 2020-04-30: qty 1

## 2020-04-30 MED ORDER — LACTATED RINGERS IV BOLUS
1000.0000 mL | Freq: Once | INTRAVENOUS | Status: AC
  Administered 2020-04-30: 1000 mL via INTRAVENOUS

## 2020-04-30 MED ORDER — BETAMETHASONE SOD PHOS & ACET 6 (3-3) MG/ML IJ SUSP
12.0000 mg | Freq: Once | INTRAMUSCULAR | Status: AC
  Administered 2020-04-30: 12 mg via INTRAMUSCULAR
  Filled 2020-04-30: qty 5

## 2020-04-30 NOTE — MAU Note (Signed)
Presents with c/o questionable ctxs and VB, reports VB just began a few minutes ago and is bright red and heavier than spotting.  Denies LOF.  Reports +FM x2.

## 2020-04-30 NOTE — MAU Provider Note (Addendum)
History     CSN: [id number]  Arrival date and time: 04/30/20 1616   First Provider Initiated Contact with Patient 04/30/20 1731      Chief Complaint  Patient presents with  . Contractions  . Vaginal Bleeding   HPI  Ms.Haley Potts is a 24 y.o. female [phone] @ 30w4dhere with contractions and discharge. This is a new problem. Di/Di twins. She describes the discharge as bloody; she was concerned it was more than spotting. She reports pain in her lower back and vagina/pressure. The pain comes and goes. She reports her pain 7/10.   OB History    Gravida  4   Para  2   Term  2   Preterm  0   AB  1   Living  2     SAB  1   TAB  0   Ectopic  0   Multiple  0   Live Births  2           Past Medical History:  Diagnosis Date  . Depression    Post partum   * lasted 4 months    Past Surgical History:  Procedure Laterality Date  . WISDOM TOOTH EXTRACTION      History reviewed. No pertinent family history.  Social History   Tobacco Use  . Smoking status: Never Smoker  . Smokeless tobacco: Never Used  Vaping Use  . Vaping Use: Never used  Substance Use Topics  . Alcohol use: No    Alcohol/week: 0.0 standard drinks  . Drug use: No    Allergies: No Known Allergies  Medications Prior to Admission  Medication Sig Dispense Refill Last Dose  . acetaminophen (TYLENOL) 325 MG tablet Take 2 tablets (650 mg total) by mouth every 6 (six) hours as needed (for pain scale < 4). 30 tablet 0 04/30/2020 at 1400  . aspirin EC 81 MG tablet Take 1 tablet (81 mg total) by mouth daily. 30 tablet 6 04/30/2020 at 1030  . Prenatal Vit-Fe Fumarate-FA (PRENATAL MULTIVITAMIN) TABS tablet Take 1 tablet by mouth daily at 12 noon.   04/30/2020 at 1030  . Blood Pressure Monitoring (BLOOD PRESSURE KIT) DEVI 1 kit by Does not apply route once a week. Check Blood Pressure regularly and record readings into the [employer] App.  Large Cuff.  DX O90.0 1 each 0   . cyclobenzaprine  (FLEXERIL) 10 MG tablet Take 1 tablet (10 mg total) by mouth 2 (two) times daily as needed for muscle spasms. (Patient not taking: Reported on 04/22/2020) 20 tablet 0   . Misc. Devices MISC Dispense one maternity belt for patient (Patient not taking: Reported on 04/29/2020) 1 each 0    No results found for this or any previous visit (from the past 48 hour(s)).  Review of Systems  Constitutional: Negative for fever.  Gastrointestinal: Positive for abdominal pain.  Genitourinary: Positive for pelvic pain. Negative for vaginal bleeding and vaginal discharge.   Physical Exam   Blood pressure 106/66, pulse (!) 102, temperature 98.4 F (36.9 C), temperature source Oral, resp. rate 18, height 5' 4"  (1.626 m), weight 76.8 kg, last menstrual period 08/25/2019, SpO2 99 %, unknown if currently breastfeeding.  Physical Exam Constitutional:      Appearance: Normal appearance.  HENT:     Head: Normocephalic.  Abdominal:     Palpations: Abdomen is soft.  Genitourinary:    Comments: Cervix: 3cm, 70%, -3, vertex position of baby A. Moderate amount of bloody show. Posterior cervix.  Skin:    General: Skin is warm.  Neurological:     Mental Status: She is alert and oriented to person, place, and time.  Psychiatric:        Behavior: Behavior normal.   Fetal Tracing Fetus A Baseline: 130 bpm Variability: Moderate  Accelerations: 15x15 Decelerations: None Toco: Irregular pattern.  Fetal Tracing Fetus B Baseline: 140 bpm Variability: Moderate  Accelerations: 15x15 Decelerations: Irregular   MAU Course  Procedures  None  MDM  Cervix 3, 70, 3 Notified NICU: Awaiting plan of care. LR bolus x1 BMZ x 1 Limited US for fetal position Dr. Elly Modena made aware.  Procardia 2 doses given  Cervix rechecked after 2 hours, cervix now 4, 80, -3, with bulging membranes. Vertex, ballotable. Discussed with Dr. Elly Modena. Per NICU, unable to admit patient d/t acuity of NICU currently. It is recommended  the patient be transferred to another hospital for delivery. Dr. Elly Modena to call [facility] for transfer.  Report given to Hansel Feinstein- oncoming CNM who resumes care of the patient.  Baby A and B both cephalic.   Lezlie Lye, NP 04/30/2020 8:01 PM  Assessment and Plan  A:  Twin IUP at [redacted]w[redacted]d     Preterm Labor       [facility]  [facility]     Patient initially wanted to sign out AMA because her boyfriend could not come in           We discussed with patient they are here to transport her so the BF can follow     She then agreed to proceed with transport  WSeabron Spates CNM

## 2020-04-30 NOTE — MAU Note (Signed)
Dr. Catalina Antigua, MD, notified this RN at 2019 of official transport to [facility], via [place]. Accepting provider is Dr. Darra Lis, MD.  Melburn Hake notified and report given at 2030.  Christa See, L&D Charge Nurse at [facility] notified at 2040 of patient transport and report given.  [facility] arrived at 2100. Report given in MAU. [facility] requested recheck of patients cervix prior to transport. Patient's exam was 580ballotable.  Appropriate transport/transfer forms printed and given to [facility]. [facility] departure from MAU at 2115.

## 2020-05-01 DIAGNOSIS — Z3A34 34 weeks gestation of pregnancy: Secondary | ICD-10-CM | POA: Diagnosis not present

## 2020-05-01 DIAGNOSIS — O30043 Twin pregnancy, dichorionic/diamniotic, third trimester: Secondary | ICD-10-CM | POA: Diagnosis not present

## 2020-05-01 DIAGNOSIS — O321XX2 Maternal care for breech presentation, fetus 2: Secondary | ICD-10-CM | POA: Diagnosis not present

## 2020-05-01 DIAGNOSIS — O43893 Other placental disorders, third trimester: Secondary | ICD-10-CM | POA: Diagnosis not present

## 2020-05-01 DIAGNOSIS — Z3A35 35 weeks gestation of pregnancy: Secondary | ICD-10-CM | POA: Diagnosis not present

## 2020-05-03 DIAGNOSIS — O9903 Anemia complicating the puerperium: Secondary | ICD-10-CM | POA: Diagnosis not present

## 2020-05-03 DIAGNOSIS — D649 Anemia, unspecified: Secondary | ICD-10-CM | POA: Diagnosis not present

## 2020-05-06 ENCOUNTER — Encounter: Payer: Medicaid Other | Admitting: Obstetrics and Gynecology

## 2020-05-13 ENCOUNTER — Ambulatory Visit: Payer: Medicaid Other

## 2020-05-13 ENCOUNTER — Ambulatory Visit: Payer: Medicaid Other | Attending: Obstetrics and Gynecology

## 2020-06-01 ENCOUNTER — Encounter: Payer: Self-pay | Admitting: Obstetrics & Gynecology

## 2020-06-01 ENCOUNTER — Ambulatory Visit (INDEPENDENT_AMBULATORY_CARE_PROVIDER_SITE_OTHER): Payer: Medicaid Other | Admitting: Obstetrics & Gynecology

## 2020-06-01 ENCOUNTER — Other Ambulatory Visit: Payer: Self-pay

## 2020-06-01 DIAGNOSIS — Z8759 Personal history of other complications of pregnancy, childbirth and the puerperium: Secondary | ICD-10-CM | POA: Diagnosis not present

## 2020-06-01 DIAGNOSIS — Z3009 Encounter for other general counseling and advice on contraception: Secondary | ICD-10-CM

## 2020-06-01 NOTE — Progress Notes (Signed)
Post Partum Visit Note  Haley Potts is a 24 y.o. [phone] female who presents for a postpartum visit. She is 4 weeks postpartum following a normal spontaneous twin vaginal delivery.  I have fully reviewed the prenatal and intrapartum course. The delivery was at 35.5 gestational weeks.  Anesthesia: epidural. Postpartum course has been unremarkable. Babies are doing well. Baby is feeding by bottle - Similac Neosure. Bleeding no bleeding. Bowel function is normal. Bladder function is normal. Patient is not sexually active. Contraception method is none. Postpartum depression screening: positive.   The pregnancy intention screening data noted above was reviewed. Potential methods of contraception were discussed. The patient elected to proceed with IUD or IUS.    Edinburgh Postnatal Depression Scale - 06/01/20 1347      Edinburgh Postnatal Depression Scale:  In the Past 7 Days   I have been able to laugh and see the funny side of things. 0    I have looked forward with enjoyment to things. 0    I have blamed myself unnecessarily when things went wrong. 0    I have been anxious or worried for no good reason. 2    I have felt scared or panicky for no good reason. 2    Things have been getting on top of me. 0    I have been so unhappy that I have had difficulty sleeping. 2    I have felt sad or miserable. 0    I have been so unhappy that I have been crying. 0    The thought of harming myself has occurred to me. 0    Edinburgh Postnatal Depression Scale Total 6            The following portions of the patient's history were reviewed and updated as appropriate: allergies, current medications, past family history, past medical history, past social history, past surgical history and problem list.  Review of Systems Pertinent items are noted in HPI.    Objective:  BP 107/63   Pulse 90   Wt 146 lb (66.2 kg)   LMP 08/25/2019   Breastfeeding No   BMI 25.06 kg/m    General:  alert,  cooperative and appears stated age   Breasts:  negative  Lungs: clear to auscultation bilaterally  Heart:  regular rate and rhythm, S1, S2 normal, no murmur, click, rub or gallop  Abdomen: soft, non-tender; bowel sounds normal; no masses,  no organomegaly        Assessment:    Normal postpartum exam. Pap smear not done at today's visit.  Will schedule tubal ligation.    Plan:   Essential components of care per ACOG recommendations:  1.  Mood and well being: Patient with negative depression screening today. Reviewed local resources for support.  - Patient does not use tobacco.  - hx of drug use? No    2. Infant care and feeding:  -Patient currently breastmilk feeding? Yes  Discussed return to work and pumping. If needed, patient was provided letter for work to allow for every 2-3 hr pumping breaks, and to be granted a private location to express breastmilk and refrigerated area to store breastmilk. Reviewed importance of draining breast regularly to support lactation. -Social determinants of health (SDOH) reviewed in EPIC. No concerns  3. Sexuality, contraception and birth spacing - Patient does not want a pregnancy in the next year.  Desired family size is 3 children.  - Reviewed forms of contraception in tiered fashion. Patient  desired bilateral tubal ligation today.    4. Sleep and fatigue -Encouraged family/partner/community support of 4 hrs of uninterrupted sleep to help with mood and fatigue  5. Physical Recovery  - Discussed patients delivery and complications - Patient had a 1 degree laceration, perineal healing reviewed. Patient expressed understanding - Patient has urinary incontinence? No  - Patient is safe to resume physical and sexual activity  6.  Health Maintenance - Last pap smear done previous to visit and was normal with negative HPV.  Center for [employer], [facility]

## 2020-06-08 ENCOUNTER — Telehealth: Payer: Self-pay | Admitting: Obstetrics

## 2020-06-08 ENCOUNTER — Encounter: Payer: Self-pay | Admitting: Obstetrics & Gynecology

## 2020-06-10 ENCOUNTER — Ambulatory Visit (INDEPENDENT_AMBULATORY_CARE_PROVIDER_SITE_OTHER): Payer: Medicaid Other | Admitting: Obstetrics and Gynecology

## 2020-06-10 ENCOUNTER — Other Ambulatory Visit: Payer: Self-pay

## 2020-06-10 ENCOUNTER — Encounter: Payer: Self-pay | Admitting: Obstetrics and Gynecology

## 2020-06-10 DIAGNOSIS — Z3009 Encounter for other general counseling and advice on contraception: Secondary | ICD-10-CM | POA: Insufficient documentation

## 2020-06-10 NOTE — Progress Notes (Signed)
Pt here for Consult only to discuss BTL.

## 2020-06-10 NOTE — Patient Instructions (Signed)
Levonorgestrel intrauterine device (IUD) What is this medicine? LEVONORGESTREL IUD (LEE voe nor jes trel) is a contraceptive (birth control) device. The device is placed inside the uterus by a healthcare professional. It is used to prevent pregnancy. This device can also be used to treat heavy bleeding that occurs during your period. This medicine may be used for other purposes; ask your health care provider or pharmacist if you have questions. COMMON BRAND NAME(S): Kyleena, LILETTA, Mirena, Skyla What should I tell my health care provider before I take this medicine? They need to know if you have any of these conditions:  abnormal Pap smear  cancer of the breast, uterus, or cervix  diabetes  endometritis  genital or pelvic infection now or in the past  have more than one sexual partner or your partner has more than one partner  heart disease  history of an ectopic or tubal pregnancy  immune system problems  IUD in place  liver disease or tumor  problems with blood clots or take blood-thinners  seizures  use intravenous drugs  uterus of unusual shape  vaginal bleeding that has not been explained  an unusual or allergic reaction to levonorgestrel, other hormones, silicone, or polyethylene, medicines, foods, dyes, or preservatives  pregnant or trying to get pregnant  breast-feeding How should I use this medicine? This device is placed inside the uterus by a health care professional. Talk to your pediatrician regarding the use of this medicine in children. Special care may be needed. Overdosage: If you think you have taken too much of this medicine contact a poison control center or emergency room at once. NOTE: This medicine is only for you. Do not share this medicine with others. What if I miss a dose? This does not apply. Depending on the brand of device you have inserted, the device will need to be replaced every 3 to 6 years if you wish to continue using this type  of birth control. What may interact with this medicine? Do not take this medicine with any of the following medications:  amprenavir  bosentan  fosamprenavir This medicine may also interact with the following medications:  aprepitant  armodafinil  barbiturate medicines for inducing sleep or treating seizures  bexarotene  boceprevir  griseofulvin  medicines to treat seizures like carbamazepine, ethotoin, felbamate, oxcarbazepine, phenytoin, topiramate  modafinil  pioglitazone  rifabutin  rifampin  rifapentine  some medicines to treat HIV infection like atazanavir, efavirenz, indinavir, lopinavir, nelfinavir, tipranavir, ritonavir  St. John's wort  warfarin This list may not describe all possible interactions. Give your health care provider a list of all the medicines, herbs, non-prescription drugs, or dietary supplements you use. Also tell them if you smoke, drink alcohol, or use illegal drugs. Some items may interact with your medicine. What should I watch for while using this medicine? Visit your doctor or health care professional for regular check ups. See your doctor if you or your partner has sexual contact with others, becomes HIV positive, or gets a sexual transmitted disease. This product does not protect you against HIV infection (AIDS) or other sexually transmitted diseases. You can check the placement of the IUD yourself by reaching up to the top of your vagina with clean fingers to feel the threads. Do not pull on the threads. It is a good habit to check placement after each menstrual period. Call your doctor right away if you feel more of the IUD than just the threads or if you cannot feel the threads at   all. The IUD may come out by itself. You may become pregnant if the device comes out. If you notice that the IUD has come out use a backup birth control method like condoms and call your health care provider. Using tampons will not change the position of the  IUD and are okay to use during your period. This IUD can be safely scanned with magnetic resonance imaging (MRI) only under specific conditions. Before you have an MRI, tell your healthcare provider that you have an IUD in place, and which type of IUD you have in place. What side effects may I notice from receiving this medicine? Side effects that you should report to your doctor or health care professional as soon as possible:  allergic reactions like skin rash, itching or hives, swelling of the face, lips, or tongue  fever, flu-like symptoms  genital sores  high blood pressure  no menstrual period for 6 weeks during use  pain, swelling, warmth in the leg  pelvic pain or tenderness  severe or sudden headache  signs of pregnancy  stomach cramping  sudden shortness of breath  trouble with balance, talking, or walking  unusual vaginal bleeding, discharge  yellowing of the eyes or skin Side effects that usually do not require medical attention (report to your doctor or health care professional if they continue or are bothersome):  acne  breast pain  change in sex drive or performance  changes in weight  cramping, dizziness, or faintness while the device is being inserted  headache  irregular menstrual bleeding within first 3 to 6 months of use  nausea This list may not describe all possible side effects. Call your doctor for medical advice about side effects. You may report side effects to FDA at 1-800-FDA-1088. Where should I keep my medicine? This does not apply. NOTE: This sheet is a summary. It may not cover all possible information. If you have questions about this medicine, talk to your doctor, pharmacist, or health care provider.  2020 Elsevier/Gold Standard (2018-05-01 13:22:01)  

## 2020-06-10 NOTE — Progress Notes (Signed)
Pt presents for discussion of possible tubal ligation.  Discussed with patient the procedure, regardless of technique, would be permanent.  Patient desires permanent sterilization.  Other reversible forms of contraception (over the counter/barrier methods; hormonal contraceptives including pill, patch, ring, Depo-Provera injection, Nexplanon implant; hormonal IUDs Skyla and Mirena; nonhormonal copper IUD Paragard) were discussed with patient. Two techniques were discussed regarding tubal ligation. She was given the choice between laparoscopic bilateral tubal sterilization using Filshie clips or laparoscopic bilateral salpingectomy. For the Filshie clip sterilization, she was told she will have one incision in her umbilicus.  Failure risk of 1-2 % with increased risk of ectopic gestation if pregnancy occurs was also discussed with patient. For the bilateral salpingectomy, she was told that both tubes will be resected via three small incisions; the failure risk of less than 1%.  Any future pregnancies will have to be attempted via IVF or other fertility procedures.  Reiterated permanence and irreversibility of both procedures; in the case of Filshie clip application, attempts to reverse tubal sterilization are often not successful.  Also emphasized risk of regret which is noted more in patients less than the age of 16.  All questions were answered. She desires laparoscopic bilateral salpingectomy.  Other risks of the procedure were discussed with patient including but not limited to: bleeding, infection, injury to surrounding organs and need for additional procedures.  Also discussed possibility of post-tubal pain syndrome.  At this point pt asked for more information regarding other types of contraception.  She has tried nexplanon and the depo-provera injection.  Long discussion regarding IUD.  Duration and mechanism of action were discussed.  Due to history of heavy menses, pt will proceed with mirena IUD.   She has been advised no unprotected intercourse prior to IUD placement.  She has also been advised to premedicate with ibuprofen before the procedure.  Information given on the device.IUD will be placed in 2 weeks.  I spent 20 minutes dedicated to the care of this patient including previsit review of records, face to face time with the patient discussing permanent contraception versus long term contraception and post visit testing.   Mariel Aloe, MD, FACOG Obstetrician & Gynecologist, [facility] for [facility], [facility]

## 2020-06-25 ENCOUNTER — Ambulatory Visit: Payer: Medicaid Other | Admitting: Obstetrics and Gynecology

## 2020-06-30 ENCOUNTER — Ambulatory Visit: Payer: Medicaid Other | Admitting: Obstetrics and Gynecology

## 2020-07-26 ENCOUNTER — Inpatient Hospital Stay (HOSPITAL_COMMUNITY): Payer: Medicaid Other

## 2020-07-26 ENCOUNTER — Inpatient Hospital Stay (HOSPITAL_COMMUNITY)
Admission: AD | Admit: 2020-07-26 | Discharge: 2020-07-26 | Disposition: A | Discharge status: Home / Self Care | Payer: Medicaid Other | Attending: Family Medicine | Admitting: Family Medicine

## 2020-07-26 ENCOUNTER — Other Ambulatory Visit: Payer: Self-pay

## 2020-07-26 ENCOUNTER — Encounter (HOSPITAL_COMMUNITY): Payer: Self-pay | Admitting: Family Medicine

## 2020-07-26 DIAGNOSIS — Z3A01 Less than 8 weeks gestation of pregnancy: Secondary | ICD-10-CM | POA: Diagnosis not present

## 2020-07-26 DIAGNOSIS — O209 Hemorrhage in early pregnancy, unspecified: Secondary | ICD-10-CM | POA: Insufficient documentation

## 2020-07-26 DIAGNOSIS — B3731 Acute candidiasis of vulva and vagina: Secondary | ICD-10-CM

## 2020-07-26 DIAGNOSIS — O4691 Antepartum hemorrhage, unspecified, first trimester: Secondary | ICD-10-CM | POA: Diagnosis not present

## 2020-07-26 DIAGNOSIS — Z3491 Encounter for supervision of normal pregnancy, unspecified, first trimester: Secondary | ICD-10-CM

## 2020-07-26 DIAGNOSIS — B373 Candidiasis of vulva and vagina: Secondary | ICD-10-CM | POA: Diagnosis not present

## 2020-07-26 DIAGNOSIS — O98811 Other maternal infectious and parasitic diseases complicating pregnancy, first trimester: Secondary | ICD-10-CM | POA: Insufficient documentation

## 2020-07-26 DIAGNOSIS — Z7982 Long term (current) use of aspirin: Secondary | ICD-10-CM | POA: Diagnosis not present

## 2020-07-26 LAB — URINALYSIS, ROUTINE W REFLEX MICROSCOPIC
Bilirubin Urine: NEGATIVE
Glucose, UA: NEGATIVE mg/dL
Ketones, ur: NEGATIVE mg/dL
Nitrite: NEGATIVE
Protein, ur: NEGATIVE mg/dL
Specific Gravity, Urine: 1.016 (ref 1.005–1.030)
pH: 5 (ref 5.0–8.0)

## 2020-07-26 LAB — WET PREP, GENITAL
Clue Cells Wet Prep HPF POC: NONE SEEN
Sperm: NONE SEEN
Trich, Wet Prep: NONE SEEN

## 2020-07-26 LAB — CBC
HCT: 40.7 % (ref 36.0–46.0)
Hemoglobin: 13.3 g/dL (ref 12.0–15.0)
MCH: 27.8 pg (ref 26.0–34.0)
MCHC: 32.7 g/dL (ref 30.0–36.0)
MCV: 85.1 fL (ref 80.0–100.0)
Platelets: 277 10*3/uL (ref 150–400)
RBC: 4.78 MIL/uL (ref 3.87–5.11)
RDW: 15 % (ref 11.5–15.5)
WBC: 6 10*3/uL (ref 4.0–10.5)
nRBC: 0 % (ref 0.0–0.2)

## 2020-07-26 LAB — HCG, QUANTITATIVE, PREGNANCY: hCG, Beta Chain, Quant, S: 15239 m[IU]/mL — ABNORMAL HIGH (ref <5)

## 2020-07-26 LAB — POCT PREGNANCY, URINE: Preg Test, Ur: POSITIVE — AB

## 2020-07-26 MED ORDER — TERCONAZOLE 0.4 % VA CREA: 1.0000 | TOPICAL_CREAM | Freq: Every day | VAGINAL | 0 refills | Status: DC

## 2020-07-26 NOTE — MAU Note (Addendum)
Pt is G4P2 with a positive HPT 2 weeks ago with unknown LMP due to no set period after delivery s/p SVD 10/29.  Pt is now c/o discharge that is dark brown when she wipes that is pink and sometimes "poop" colored, and mucus with occasional cramping with a 5/10 pain.    No itching or discharge.   No pain with urination.    In addition pt is c/o chest pain that is tight and makes it hard to breath. It happens intermittently and the last time it happened was yesterday around 1800.  Nothing specific makes it worse, and tylenol and stretching make it better.  Pt reports difficulty catching her breath when it happens.

## 2020-07-26 NOTE — Discharge Instructions (Signed)
Safe Medications in Pregnancy   Acne: Benzoyl Peroxide Salicylic Acid  Backache/Headache: Tylenol: 2 regular strength every 4 hours OR              2 Extra strength every 6 hours  Colds/Coughs/Allergies: Benadryl (alcohol free) 25 mg every 6 hours as needed Breath right strips Claritin Cepacol throat lozenges Chloraseptic throat spray Cold-Eeze- up to three times per day Cough drops, alcohol free Flonase (by prescription only) Guaifenesin Mucinex Robitussin DM (plain only, alcohol free) Saline nasal spray/drops Sudafed (pseudoephedrine) & Actifed ** use only after [redacted] weeks gestation and if you do not have high blood pressure Tylenol Vicks Vaporub Zinc lozenges Zyrtec   Constipation: Colace Ducolax suppositories Fleet enema Glycerin suppositories Metamucil Milk of magnesia Miralax Senokot Smooth move tea  Diarrhea: Kaopectate Imodium A-D  *NO pepto Bismol  Hemorrhoids: Anusol Anusol HC Preparation H Tucks  Indigestion: Tums Maalox Mylanta Zantac  Pepcid  Insomnia: Benadryl (alcohol free) 25mg every 6 hours as needed Tylenol PM Unisom, no Gelcaps  Leg Cramps: Tums MagGel  Nausea/Vomiting:  Bonine Dramamine Emetrol Ginger extract Sea bands Meclizine  Nausea medication to take during pregnancy:  Unisom (doxylamine succinate 25 mg tablets) Take one tablet daily at bedtime. If symptoms are not adequately controlled, the dose can be increased to a maximum recommended dose of two tablets daily (1/2 tablet in the morning, 1/2 tablet mid-afternoon and one at bedtime). Vitamin B6 100mg tablets. Take one tablet twice a day (up to 200 mg per day).  Skin Rashes: Aveeno products Benadryl cream or 25mg every 6 hours as needed Calamine Lotion 1% cortisone cream  Yeast infection: Gyne-lotrimin 7 Monistat 7   **If taking multiple medications, please check labels to avoid duplicating the same active ingredients **take medication as directed on  the label ** Do not exceed 4000 mg of tylenol in 24 hours **Do not take medications that contain aspirin or ibuprofen    Prenatal Care Providers           [facility]  [address] [phone]  [facility]   [address]  [phone]  [facility] @ [place]       [address] [phone]            [facility] @ [facility]     [address] [phone]          [facility] @ [place]   [address] [phone]  [facility]  [address] [phone]     [facility])  [address]   [phone]     [facility]  Phone: [phone]  [facility]  Phone: [phone]  [facility] Phone: [phone]  [facility] Phone: [phone]  [facility] Phone: [phone]  [facility] Phone: [phone]  [facility]  Phone: [phone]   Obstetrics: Normal and Problem Pregnancies (7th ed., pp. 102-121). [place], [place]: Elsevier."> Textbook of Family Medicine (9th ed., pp. 365-410). [place]: [employer].">  First Trimester of Pregnancy  The first trimester of pregnancy starts on the first day of your last menstrual period until the end of week 12. This is months 1 through 3 of pregnancy. A week after a sperm fertilizes an egg, the egg will   into the wall of the uterus and begin to develop into a baby. By the end of 12 weeks, all the baby's organs will be formed and the baby will be 2-3 inches in size. Body changes during your first trimester Your body goes through many changes during pregnancy. The changes vary and generally return to normal after your baby is born. Physical  changes  You may gain or lose weight.  Your breasts may begin to grow larger and become tender. The tissue that surrounds your nipples (areola) may become darker.  Dark spots or blotches (chloasma or mask of pregnancy) may develop on your face.  You may have changes in your hair. These can include thickening or thinning of your hair or changes in texture. Health changes  You may feel nauseous, and you may vomit.  You may have heartburn.  You may develop headaches.  You may develop constipation.  Your gums may bleed and may be sensitive to brushing and flossing. Other changes  You may tire easily.  You may urinate more often.  Your menstrual periods will stop.  You may have a loss of appetite.  You may develop cravings for certain kinds of food.  You may have changes in your emotions from day to day.  You may have more vivid and strange dreams. Follow these instructions at home: Medicines  Follow your health care provider's instructions regarding medicine use. Specific medicines may be either safe or unsafe to take during pregnancy. Do not take any medicines unless told to by your health care provider.  Take a prenatal vitamin that contains at least 600 micrograms (mcg) of folic acid. Eating and drinking  Eat a healthy diet that includes fresh fruits and vegetables, whole grains, good sources of protein such as meat, eggs, or tofu, and low-fat dairy products.  Avoid raw meat and unpasteurized juice, milk, and cheese. These carry germs that can harm you and your baby.  If you feel nauseous or you vomit: ? Eat 4 or 5 small meals a day instead of 3 large meals. ? Try eating a few soda crackers. ? Drink liquids between meals instead of during meals.  You may need to take these actions to prevent or treat constipation: ? Drink enough fluid to keep your urine pale yellow. ? Eat foods that are high in fiber, such as beans, whole grains, and fresh fruits and  vegetables. ? Limit foods that are high in fat and processed sugars, such as fried or sweet foods. Activity  Exercise only as directed by your health care provider. Most people can continue their usual exercise routine during pregnancy. Try to exercise for 30 minutes at least 5 days a week.  Stop exercising if you develop pain or cramping in the lower abdomen or lower back.  Avoid exercising if it is very hot or humid or if you are at high altitude.  Avoid heavy lifting.  If you choose to, you may have sex unless your health care provider tells you not to. Relieving pain and discomfort  Wear a good support bra to relieve breast tenderness.  Rest with your legs elevated if you have leg cramps or low back pain.  If you develop bulging veins (varicose veins) in your legs: ? Wear support hose as told by your health care provider. ? Elevate your feet for 15 minutes, 3-4 times a day. ? Limit salt in your diet. Safety  Wear your seat belt at all times when driving or riding in a  a car.  Talk with your health care provider if someone is verbally or physically abusive to you.  Talk with your health care provider if you are feeling sad or have thoughts of hurting yourself. Lifestyle  Do not use hot tubs, steam rooms, or saunas.  Do not douche. Do not use tampons or scented sanitary pads.  Do not use herbal remedies, alcohol, illegal drugs, or medicines that are not approved by your health care provider. Chemicals in these products can harm your baby.  Do not use any products that contain nicotine or tobacco, such as cigarettes, e-cigarettes, and chewing tobacco. If you need help quitting, ask your health care provider.  Avoid cat litter boxes and soil used by cats. These carry germs that can cause birth defects in the baby and possibly loss of the unborn baby (fetus) by miscarriage or stillbirth. General instructions  During routine prenatal visits in the first trimester, your  health care provider will do a physical exam, perform necessary tests, and ask you how things are going. Keep all follow-up visits. This is important.  Ask for help if you have counseling or nutritional needs during pregnancy. Your health care provider can offer advice or refer you to specialists for help with various needs.  Schedule a dentist appointment. At home, brush your teeth with a soft toothbrush. Floss gently.  Write down your questions. Take them to your prenatal visits. Where to find more information  American Pregnancy Association: americanpregnancy.org  American College of Obstetricians and Gynecologists: acog.org/en/Womens%20Health/Pregnancy  Office on Women's Health: womenshealth.gov/pregnancy Contact a health care provider if you have:  Dizziness.  A fever.  Mild pelvic cramps, pelvic pressure, or nagging pain in the abdominal area.  Nausea, vomiting, or diarrhea that lasts for 24 hours or longer.  A bad-smelling vaginal discharge.  Pain when you urinate.  Known exposure to a contagious illness, such as chickenpox, measles, Zika virus, HIV, or hepatitis. Get help right away if you have:  Spotting or bleeding from your vagina.  Severe abdominal cramping or pain.  Shortness of breath or chest pain.  Any kind of trauma, such as from a fall or a car crash.  New or increased pain, swelling, or redness in an arm or leg. Summary  The first trimester of pregnancy starts on the first day of your last menstrual period until the end of week 12 (months 1 through 3).  Eating 4 or 5 small meals a day rather than 3 large meals may help to relieve nausea and vomiting.  Do not use any products that contain nicotine or tobacco, such as cigarettes, e-cigarettes, and chewing tobacco. If you need help quitting, ask your health care provider.  Keep all follow-up visits. This is important. This information is not intended to replace advice given to you by your health care  provider. Make sure you discuss any questions you have with your health care provider. Document Revised: 11/27/2019 Document Reviewed: 10/03/2019 Elsevier Patient Education  2021 Elsevier Inc.  

## 2020-07-26 NOTE — MAU Provider Note (Signed)
History     CSN: [id number]  Arrival date and time: 07/26/20 1232   Event Date/Time   First Provider Initiated Contact with Patient 07/26/20 1431      Chief Complaint  Patient presents with  . Vaginal Bleeding   HPI Haley Potts is a 25 y.o. [id number] at 60w1dwho presents with vaginal bleeding. She states she has discharge that is sometimes pink and sometimes brown color. She also reports cramping that she rates a 5/10. She has not taken anything for the pain. She reports a positive HPT 2 weeks ago and wants to make sure everything is ok.   OB History    Gravida  5   Para  3   Term  2   Preterm  1   AB  1   Living  2     SAB  1   IAB  0   Ectopic  0   Multiple  1   Live Births  2           Past Medical History:  Diagnosis Date  . Depression    Post partum   * lasted 4 months    Past Surgical History:  Procedure Laterality Date  . WISDOM TOOTH EXTRACTION      History reviewed. No pertinent family history.  Social History   Tobacco Use  . Smoking status: Never Smoker  . Smokeless tobacco: Never Used  Vaping Use  . Vaping Use: Never used  Substance Use Topics  . Alcohol use: No    Alcohol/week: 0.0 standard drinks  . Drug use: No    Allergies: No Known Allergies  Medications Prior to Admission  Medication Sig Dispense Refill Last Dose  . acetaminophen (TYLENOL) 325 MG tablet Take 2 tablets (650 mg total) by mouth every 6 (six) hours as needed (for pain scale < 4). (Patient not taking: Reported on 06/01/2020) 30 tablet 0   . aspirin EC 81 MG tablet Take 1 tablet (81 mg total) by mouth daily. (Patient not taking: Reported on 06/01/2020) 30 tablet 6   . Blood Pressure Monitoring (BLOOD PRESSURE KIT) DEVI 1 kit by Does not apply route once a week. Check Blood Pressure regularly and record readings into the [employer] App.  Large Cuff.  DX O90.0 (Patient not taking: Reported on 06/10/2020) 1 each 0   . cyclobenzaprine (FLEXERIL) 10 MG tablet  Take 1 tablet (10 mg total) by mouth 2 (two) times daily as needed for muscle spasms. (Patient not taking: Reported on 04/22/2020) 20 tablet 0   . Misc. Devices MISC Dispense one maternity belt for patient (Patient not taking: Reported on 04/29/2020) 1 each 0   . Prenatal Vit-Fe Fumarate-FA (PRENATAL MULTIVITAMIN) TABS tablet Take 1 tablet by mouth daily at 12 noon. (Patient not taking: Reported on 06/10/2020)       Review of Systems  Constitutional: Negative.  Negative for fatigue and fever.  HENT: Negative.   Respiratory: Negative.  Negative for shortness of breath.   Cardiovascular: Negative.  Negative for chest pain.  Gastrointestinal: Positive for abdominal pain. Negative for constipation, diarrhea, nausea and vomiting.  Genitourinary: Positive for vaginal bleeding and vaginal discharge. Negative for dysuria.  Neurological: Negative.  Negative for dizziness and headaches.   Physical Exam   Blood pressure 107/84, pulse (!) 123, temperature 98.2 F (36.8 C), temperature source Oral, resp. rate 16, weight 69.9 kg, last menstrual period 06/13/2020, not currently breastfeeding.  Physical Exam Vitals and nursing note reviewed.  Constitutional:  General: She is not in acute distress.    Appearance: She is well-developed and well-nourished.  HENT:     Head: Normocephalic.  Eyes:     Pupils: Pupils are equal, round, and reactive to light.  Cardiovascular:     Rate and Rhythm: Normal rate and regular rhythm.     Heart sounds: Normal heart sounds.  Pulmonary:     Effort: Pulmonary effort is normal. No respiratory distress.     Breath sounds: Normal breath sounds.  Abdominal:     General: Bowel sounds are normal. There is no distension.     Palpations: Abdomen is soft.     Tenderness: There is no abdominal tenderness.  Skin:    General: Skin is warm and dry.  Neurological:     Mental Status: She is alert and oriented to person, place, and time.  Psychiatric:        Mood and  Affect: Mood and affect normal.        Behavior: Behavior normal.        Thought Content: Thought content normal.        Judgment: Judgment normal.     MAU Course  Procedures Results for orders placed or performed during the hospital encounter of 07/26/20 (from the past 24 hour(s))  Urinalysis, Routine w reflex microscopic     Status: Abnormal   Collection Time: 07/26/20  1:35 PM  Result Value Ref Range   Color, Urine YELLOW YELLOW   APPearance HAZY (A) CLEAR   Specific Gravity, Urine 1.016 1.005 - 1.030   pH 5.0 5.0 - 8.0   Glucose, UA NEGATIVE NEGATIVE mg/dL   Hgb urine dipstick MODERATE (A) NEGATIVE   Bilirubin Urine NEGATIVE NEGATIVE   Ketones, ur NEGATIVE NEGATIVE mg/dL   Protein, ur NEGATIVE NEGATIVE mg/dL   Nitrite NEGATIVE NEGATIVE   Leukocytes,Ua TRACE (A) NEGATIVE   RBC / HPF 0-5 0 - 5 RBC/hpf   WBC, UA 0-5 0 - 5 WBC/hpf   Bacteria, UA RARE (A) NONE SEEN   Squamous Epithelial / LPF 0-5 0 - 5   Mucus PRESENT   Pregnancy, urine POC     Status: Abnormal   Collection Time: 07/26/20  1:36 PM  Result Value Ref Range   Preg Test, Ur POSITIVE (A) NEGATIVE  CBC     Status: None   Collection Time: 07/26/20  2:00 PM  Result Value Ref Range   WBC 6.0 4.0 - 10.5 K/uL   RBC 4.78 3.87 - 5.11 MIL/uL   Hemoglobin 13.3 12.0 - 15.0 g/dL   HCT 40.7 36.0 - 46.0 %   MCV 85.1 80.0 - 100.0 fL   MCH 27.8 26.0 - 34.0 pg   MCHC 32.7 30.0 - 36.0 g/dL   RDW 15.0 11.5 - 15.5 %   Platelets 277 150 - 400 K/uL   nRBC 0.0 0.0 - 0.2 %  hCG, quantitative, pregnancy     Status: Abnormal   Collection Time: 07/26/20  2:00 PM  Result Value Ref Range   hCG, Beta Chain, Quant, S 15,239 (H) <5 mIU/mL  Wet prep, genital     Status: Abnormal   Collection Time: 07/26/20  2:25 PM  Result Value Ref Range   Yeast Wet Prep HPF POC PRESENT (A) NONE SEEN   Trich, Wet Prep NONE SEEN NONE SEEN   Clue Cells Wet Prep HPF POC NONE SEEN NONE SEEN   WBC, Wet Prep HPF POC MODERATE (A) NONE SEEN   Sperm NONE  SEEN  US OB LESS THAN 14 WEEKS WITH OB TRANSVAGINAL  Result Date: 07/26/2020 CLINICAL DATA:  Vaginal bleed.  Pregnant. EXAM: OBSTETRIC <14 WK [place] AND TRANSVAGINAL OB US TECHNIQUE: Both transabdominal and transvaginal ultrasound examinations were performed for complete evaluation of the gestation as well as the maternal uterus, adnexal regions, and pelvic cul-de-sac. Transvaginal technique was performed to assess early pregnancy. COMPARISON:  April 30, 2020 FINDINGS: Intrauterine gestational sac: Single Yolk sac:  Visualized Embryo:  Not visualized Cardiac Activity: Not visualized Heart Rate: Does not apply bpm MSD: 9.7 mm   5 w   5 d CRL:    mm    w    d                  [place] EDC: Subchorionic hemorrhage:  None visualized. Maternal uterus/adnexae: Corpus luteal cyst is identified in the left ovary. The right ovary is normal. IMPRESSION: Single intrauterine gestational sac with visualized yolk sac but no fetal pole noted measuring to 5 weeks and 5 days. Findings may be due to early pregnancy. Follow-up in 2 weeks is recommended. Electronically Signed   By: Abelardo Diesel M.D.   On: 07/26/2020 15:28    MDM UA, UPT CBC, HCG ABO/Rh- A Pos Wet prep and gc/chlamydia US OB Comp Less 14 weeks with Transvaginal   Assessment and Plan   1. Normal intrauterine pregnancy on prenatal ultrasound in first trimester   2. Vaginal bleeding affecting early pregnancy   3. [redacted] weeks gestation of pregnancy   4. Candidiasis of vagina during pregnancy    -Discharge home in stable condition -Rx for terazol sent to patient's pharmacy -First trimester precautions discussed -Patient advised to follow-up with [facility] in 2 weeks for repeat ultrasound, order placed -Patient may return to MAU as needed or if her condition were to change or worsen   Wende Mott CNM 07/26/2020, 4:46 PM

## 2020-07-27 LAB — GC/CHLAMYDIA PROBE AMP (~~LOC~~) NOT AT ARMC
Chlamydia: NEGATIVE
Comment: NEGATIVE
Comment: NORMAL
Neisseria Gonorrhea: NEGATIVE

## 2020-07-29 ENCOUNTER — Encounter: Payer: Self-pay | Admitting: *Deleted

## 2020-08-06 ENCOUNTER — Other Ambulatory Visit: Payer: Self-pay

## 2020-08-06 ENCOUNTER — Inpatient Hospital Stay (HOSPITAL_COMMUNITY)
Admission: AD | Admit: 2020-08-06 | Discharge: 2020-08-06 | Disposition: A | Discharge status: Home / Self Care | Payer: Medicaid Other | Attending: Obstetrics and Gynecology | Admitting: Obstetrics and Gynecology

## 2020-08-06 ENCOUNTER — Encounter (HOSPITAL_COMMUNITY): Payer: Self-pay | Admitting: Obstetrics and Gynecology

## 2020-08-06 DIAGNOSIS — O26891 Other specified pregnancy related conditions, first trimester: Secondary | ICD-10-CM | POA: Diagnosis not present

## 2020-08-06 DIAGNOSIS — R109 Unspecified abdominal pain: Secondary | ICD-10-CM | POA: Diagnosis not present

## 2020-08-06 DIAGNOSIS — Z3A01 Less than 8 weeks gestation of pregnancy: Secondary | ICD-10-CM | POA: Diagnosis not present

## 2020-08-06 LAB — URINALYSIS, ROUTINE W REFLEX MICROSCOPIC
Bilirubin Urine: NEGATIVE
Glucose, UA: NEGATIVE mg/dL
Ketones, ur: NEGATIVE mg/dL
Leukocytes,Ua: NEGATIVE
Nitrite: NEGATIVE
Protein, ur: NEGATIVE mg/dL
Specific Gravity, Urine: 1.023 (ref 1.005–1.030)
pH: 6 (ref 5.0–8.0)

## 2020-08-06 NOTE — MAU Note (Addendum)
PT SAYS HAS PINK D/C WHEN WIPING - STARTED ON 1-24.  ALSO HAS LOWER ABD AND BACK  CRAMPING - STARTED MON- TODAY WORSE. TOOK TYLENOL XS 1 TAB- AT 630PM- NO RELIEF. [facility]- NO APPOINTMENT - BUT PLANS TO GO TO [facility] .            ALSO SAYS SHE WAS HERE 2 WEEKS AGO- SUSPECTED A SAB- SHE HAS AN APPOINTMENT ON Monday  FOR U/S.

## 2020-08-06 NOTE — Discharge Instructions (Signed)
Abdominal Pain During Pregnancy Belly (abdominal) pain is common during pregnancy. There are many possible causes. Some causes are more serious than others. Sometimes the cause is not known. Always tell your doctor if you have belly pain. Follow these instructions at home:  Do not have sex or put anything in your vagina until your pain goes away completely.  Get plenty of rest until your pain gets better.  Drink enough fluid to keep your pee (urine) pale yellow.  Take over-the-counter and prescription medicines only as told by your doctor.  Keep all follow-up visits.   Contact a doctor if:  You keep having pain after resting.  Your pain gets worse after resting.  You have lower belly pain that: ? Comes and goes at regular times. ? Spreads to your back. ? Feels like menstrual cramps.  You have pain or burning when you pee (urinate). Get help right away if:  You have a fever or chills.  You feel like it is hard to breathe.  You have bleeding from your vagina.  You are leaking fluid or tissue from your vagina.  You vomit for more than 24 hours.  You have watery poop (diarrhea) for more than 24 hours.  Your baby is moving less than usual.  You feel very weak or faint.  You have very bad pain in your upper belly. Summary  Belly pain is common during pregnancy. There are many possible causes.  If you have belly pain during pregnancy, tell your doctor right away.  Keep all follow-up visits. This information is not intended to replace advice given to you by your health care provider. Make sure you discuss any questions you have with your health care provider. Document Revised: 03/03/2020 Document Reviewed: 03/03/2020 Elsevier Patient Education  2021 Elsevier Inc.  

## 2020-08-06 NOTE — MAU Provider Note (Signed)
History     [id number]  Arrival date and time: 08/06/20 1903    Chief Complaint  Patient presents with  . Abdominal Pain     HPI Haley Potts is a 25 y.o. at 40w5dby 5wk [place] PMHx notable for PTD, who presents for abdominal pain.   Seen in the MAU on 07/26/2020 with abdominal pain and bleeding. Had TVUS that showed early gestational sac, discharged home with return precautions and plan for follow up [place] 08/10/2020.   Today patient reports she has had one week of worsening lower abdominal cramping that became acutely worse today so she decided to present even though she has an [place] early next week. She endorses some mild spotting with wiping but no frank vaginal bleeding.    A/Positive/-- (05/13 1351)  OB History    Gravida  5   Para  3   Term  2   Preterm  1   AB  1   Living  4     SAB  1   IAB  0   Ectopic  0   Multiple  1   Live Births  4           Past Medical History:  Diagnosis Date  . Depression    Post partum   * lasted 4 months    Past Surgical History:  Procedure Laterality Date  . WISDOM TOOTH EXTRACTION      History reviewed. No pertinent family history.  Social History   Socioeconomic History  . Marital status: Single    Spouse name: Not on file  . Number of children: Not on file  . Years of education: Not on file  . Highest education level: Not on file  Occupational History  . Not on file  Tobacco Use  . Smoking status: Never Smoker  . Smokeless tobacco: Never Used  Vaping Use  . Vaping Use: Never used  Substance and Sexual Activity  . Alcohol use: No    Alcohol/week: 0.0 standard drinks  . Drug use: No  . Sexual activity: Not Currently    Partners: Male    Birth control/protection: None  Other Topics Concern  . Not on file  Social History Narrative  . Not on file   Social Determinants of Health   Financial Resource Strain: Not on file  Food Insecurity: Not on file  Transportation Needs: Not on file   Physical Activity: Not on file  Stress: Not on file  Social Connections: Not on file  Intimate Partner Violence: Not on file    No Known Allergies  No current facility-administered medications on file prior to encounter.   Current Outpatient Medications on File Prior to Encounter  Medication Sig Dispense Refill  . acetaminophen (TYLENOL) 325 MG tablet Take 2 tablets (650 mg total) by mouth every 6 (six) hours as needed (for pain scale < 4). (Patient not taking: Reported on 06/01/2020) 30 tablet 0  . aspirin EC 81 MG tablet Take 1 tablet (81 mg total) by mouth daily. (Patient not taking: Reported on 06/01/2020) 30 tablet 6  . Blood Pressure Monitoring (BLOOD PRESSURE KIT) DEVI 1 kit by Does not apply route once a week. Check Blood Pressure regularly and record readings into the Babys[employer] App.  Large Cuff.  DX O90.0 (Patient not taking: Reported on 06/10/2020) 1 each 0  . cyclobenzaprine (FLEXERIL) 10 MG tablet Take 1 tablet (10 mg total) by mouth 2 (two) times daily as needed for muscle  spasms. (Patient not taking: Reported on 04/22/2020) 20 tablet 0  . Misc. Devices MISC Dispense one maternity belt for patient (Patient not taking: Reported on 04/29/2020) 1 each 0  . Prenatal Vit-Fe Fumarate-FA (PRENATAL MULTIVITAMIN) TABS tablet Take 1 tablet by mouth daily at 12 noon. (Patient not taking: Reported on 06/10/2020)    . terconazole (TERAZOL 7) 0.4 % vaginal cream Place 1 applicator vaginally at bedtime. 45 g 0     ROS Pertinent positives and negative per HPI, all others reviewed and negative  Physical Exam   BP 111/63 (BP Location: Right Arm)   Pulse 66   Temp 98.8 F (37.1 C) (Oral)   Resp 18   Ht 5' 4"  (1.626 m)   Wt 69.3 kg   LMP 06/13/2020 (Approximate)   BMI 26.23 kg/m   Patient Vitals for the past 24 hrs:  BP Temp Temp src Pulse Resp Height Weight  08/06/20 2017 111/63 - - 66 - - -  08/06/20 1923 111/65 98.8 F (37.1 C) Oral 71 18 5' 4"  (1.626 m) 69.3 kg    Physical  Exam Vitals reviewed.  Constitutional:      General: She is not in acute distress.    Appearance: She is well-developed and well-nourished. She is not diaphoretic.  Eyes:     General: No scleral icterus. Pulmonary:     Effort: Pulmonary effort is normal. No respiratory distress.  Abdominal:     General: There is no distension.     Palpations: Abdomen is soft.     Tenderness: There is no abdominal tenderness. There is no guarding or rebound.  Musculoskeletal:        General: No edema.  Skin:    General: Skin is warm and dry.  Neurological:     Mental Status: She is alert.     Coordination: Coordination normal.  Psychiatric:        Mood and Affect: Mood and affect normal.      Cervical Exam    Bedside Ultrasound Pt informed that the ultrasound is considered a limited OB ultrasound and is not intended to be a complete ultrasound exam.  Patient also informed that the ultrasound is not being completed with the intent of assessing for fetal or placental anomalies or any pelvic abnormalities.  Explained that the purpose of today's ultrasound is to assess for  viability.  Patient acknowledges the purpose of the exam and the limitations of the study.    My interpretation: viable IUP seen, FHR 143 bpm, CRL measuring 7w   Labs Results for orders placed or performed during the hospital encounter of 08/06/20 (from the past 24 hour(s))  Urinalysis, Routine w reflex microscopic Urine, Clean Catch     Status: Abnormal   Collection Time: 08/06/20  7:41 PM  Result Value Ref Range   Color, Urine YELLOW YELLOW   APPearance HAZY (A) CLEAR   Specific Gravity, Urine 1.023 1.005 - 1.030   pH 6.0 5.0 - 8.0   Glucose, UA NEGATIVE NEGATIVE mg/dL   Hgb urine dipstick SMALL (A) NEGATIVE   Bilirubin Urine NEGATIVE NEGATIVE   Ketones, ur NEGATIVE NEGATIVE mg/dL   Protein, ur NEGATIVE NEGATIVE mg/dL   Nitrite NEGATIVE NEGATIVE   Leukocytes,Ua NEGATIVE NEGATIVE   RBC / HPF 0-5 0 - 5 RBC/hpf   WBC,  UA 0-5 0 - 5 WBC/hpf   Bacteria, UA RARE (A) NONE SEEN   Squamous Epithelial / LPF 0-5 0 - 5   Mucus PRESENT  Imaging No results found.  MAU Course  Procedures  Lab Orders     Urinalysis, Routine w reflex microscopic Urine, Clean Catch No orders of the defined types were placed in this encounter.  Imaging Orders  No imaging studies ordered today    MDM mild  Assessment and Plan  #Abdominal pain, first trimester Patient reassured that viable IUP is seen on bedside US, and that cramping is common in early pregnancy. Encouraged her to keep ultrasound appointment, routine return preacutions.   #FWB Viable IUP with FHR 143 bpm on BSUS  Discharged home in stable condition.   Clarnce Flock, MD/MPH 08/06/20 8:59 PM  Allergies as of 08/06/2020   No Known Allergies     Medication List    STOP taking these medications   cyclobenzaprine 10 MG tablet Commonly known as: FLEXERIL     TAKE these medications   acetaminophen 325 MG tablet Commonly known as: Tylenol Take 2 tablets (650 mg total) by mouth every 6 (six) hours as needed (for pain scale < 4).   aspirin EC 81 MG tablet Take 1 tablet (81 mg total) by mouth daily.   Blood Pressure Kit Devi 1 kit by Does not apply route once a week. Check Blood Pressure regularly and record readings into the Babyscripts App.  Large Cuff.  DX O90.0   Misc. Devices Misc Dispense one maternity belt for patient   prenatal multivitamin Tabs tablet Take 1 tablet by mouth daily at 12 noon.   terconazole 0.4 % vaginal cream Commonly known as: Terazol 7 Place 1 applicator vaginally at bedtime.

## 2020-08-06 NOTE — MAU Note (Signed)
Patient left without signing AVS

## 2020-08-10 ENCOUNTER — Other Ambulatory Visit: Payer: Self-pay

## 2020-08-10 ENCOUNTER — Ambulatory Visit (INDEPENDENT_AMBULATORY_CARE_PROVIDER_SITE_OTHER): Payer: Medicaid Other | Admitting: *Deleted

## 2020-08-10 ENCOUNTER — Encounter: Payer: Self-pay | Admitting: *Deleted

## 2020-08-10 ENCOUNTER — Ambulatory Visit
Admission: RE | Admit: 2020-08-10 | Discharge: 2020-08-10 | Disposition: A | Discharge status: Home / Self Care | Payer: Medicaid Other | Source: Ambulatory Visit | Attending: Obstetrics and Gynecology | Admitting: Obstetrics and Gynecology

## 2020-08-10 VITALS — BP 106/69 | HR 58 | Ht 64.0 in

## 2020-08-10 DIAGNOSIS — Z3491 Encounter for supervision of normal pregnancy, unspecified, first trimester: Secondary | ICD-10-CM | POA: Diagnosis not present

## 2020-08-10 DIAGNOSIS — Z712 Person consulting for explanation of examination or test findings: Secondary | ICD-10-CM | POA: Diagnosis not present

## 2020-08-10 DIAGNOSIS — Z3A08 8 weeks gestation of pregnancy: Secondary | ICD-10-CM | POA: Diagnosis not present

## 2020-08-10 DIAGNOSIS — O209 Hemorrhage in early pregnancy, unspecified: Secondary | ICD-10-CM | POA: Diagnosis not present

## 2020-08-10 NOTE — Progress Notes (Addendum)
[place] results reviewed by Nolene Bernheim, NP. Pt informed of viable intrauterine pregnancy. Pt is established @ [facility] and may now schedule prenatal care. Pt voiced understanding of information given.    Chart reviewed for nurse visit. Agree with plan of care.   Currie Paris, NP 08/11/2020 1:23 PM

## 2020-09-21 ENCOUNTER — Encounter: Payer: Self-pay | Admitting: Obstetrics & Gynecology

## 2020-09-21 ENCOUNTER — Ambulatory Visit (INDEPENDENT_AMBULATORY_CARE_PROVIDER_SITE_OTHER): Payer: Medicaid Other | Admitting: Obstetrics & Gynecology

## 2020-09-21 ENCOUNTER — Other Ambulatory Visit: Payer: Self-pay

## 2020-09-21 VITALS — BP 105/72 | HR 81 | Wt 156.0 lb

## 2020-09-21 DIAGNOSIS — R1013 Epigastric pain: Secondary | ICD-10-CM | POA: Diagnosis not present

## 2020-09-21 DIAGNOSIS — K439 Ventral hernia without obstruction or gangrene: Secondary | ICD-10-CM | POA: Diagnosis not present

## 2020-09-21 NOTE — Progress Notes (Unsigned)
Patient ID: Haley Potts, female   DOB: 08-16-1995, 25 y.o.   MRN: [id number]  Chief Complaint  Patient presents with  . Abdominal Pain    HPI Haley Potts is a 25 y.o. female.  [id number] Patient's last menstrual period was 06/13/2020 (approximate). She recently had TAB and has no problem with bleeding or pelvic pain. For about a week she has had epigastric pain and a midline bulge above the umbilicus, no n&v HPI  Past Medical History:  Diagnosis Date  . Depression    Post partum   * lasted 4 months    Past Surgical History:  Procedure Laterality Date  . WISDOM TOOTH EXTRACTION      No family history on file.  Social History Social History   Tobacco Use  . Smoking status: Never Smoker  . Smokeless tobacco: Never Used  Vaping Use  . Vaping Use: Never used  Substance Use Topics  . Alcohol use: No    Alcohol/week: 0.0 standard drinks  . Drug use: No    No Known Allergies  Current Outpatient Medications  Medication Sig Dispense Refill  . acetaminophen (TYLENOL) 325 MG tablet Take 2 tablets (650 mg total) by mouth every 6 (six) hours as needed (for pain scale < 4). (Patient not taking: Reported on 06/01/2020) 30 tablet 0  . aspirin EC 81 MG tablet Take 1 tablet (81 mg total) by mouth daily. (Patient not taking: Reported on 06/01/2020) 30 tablet 6  . Blood Pressure Monitoring (BLOOD PRESSURE KIT) DEVI 1 kit by Does not apply route once a week. Check Blood Pressure regularly and record readings into the Babyscripts App.  Large Cuff.  DX O90.0 (Patient not taking: Reported on 06/10/2020) 1 each 0  . Misc. Devices MISC Dispense one maternity belt for patient (Patient not taking: Reported on 04/29/2020) 1 each 0  . Prenatal Vit-Fe Fumarate-FA (PRENATAL MULTIVITAMIN) TABS tablet Take 1 tablet by mouth daily at 12 noon. (Patient not taking: Reported on 06/10/2020)    . terconazole (TERAZOL 7) 0.4 % vaginal cream Place 1 applicator vaginally at bedtime. 45 g 0   No current  facility-administered medications for this visit.    Review of Systems Review of Systems  Constitutional: Negative.   Respiratory: Negative.   Gastrointestinal: Positive for abdominal distention (above umbilicus) and abdominal pain. Negative for constipation and nausea.  Genitourinary: Negative.     Blood pressure 105/72, pulse 81, weight 156 lb (70.8 kg), last menstrual period 06/13/2020, unknown if currently breastfeeding.  Physical Exam Physical Exam Constitutional:      Appearance: She is well-developed.  Abdominal:     Palpations: Abdomen is soft. There is no mass.     Hernia: A hernia is present. Hernia is present in the ventral area.       Comments: Area of suspected hernia, not tender  Neurological:     Mental Status: She is alert.     Data Reviewed   Assessment Possible epigastric ventral hernia Orders Placed This Encounter  Procedures  . CT ABDOMEN PELVIS W WO CONTRAST    This exam should ONLY be ordered for initial diagnosis or follow up of known pancreatic/liver/renal/bladder masses.    Standing Status:   Future    Standing Expiration Date:   01/21/2021    Order Specific Question:   If indicated for the ordered procedure, I authorize the administration of contrast media per Radiology protocol    Answer:   Yes    Order Specific Question:  Is patient pregnant?    Answer:   No    Order Specific Question:   Preferred imaging location?    Answer:   [facility]    Order Specific Question:   Release to patient    Answer:   Immediate    Order Specific Question:   Is Oral Contrast requested for this exam?    Answer:   Yes, Per Radiology protocol     Plan After study complete f/u with PCP    Haley Potts 09/21/2020, 1:58 PM

## 2020-09-21 NOTE — Progress Notes (Signed)
RGYN patient presents for problem per patient c/o abdominal pain possible hernia.  Notes shape pain that comes and goes pain is 5-6/10x.  Pt recently had TAB 09/05/20.

## 2020-09-24 NOTE — Patient Instructions (Signed)
Hernia, Adult   A hernia happens when tissue inside your body pushes out through a weak spot in your belly muscles (abdominal wall). This makes a round lump (bulge). The lump may be: In a scar from surgery that was done in your belly (incisional hernia). Near your belly button (umbilical hernia). In your groin (inguinal hernia). Your groin is the area where your leg meets your lower belly (abdomen). This kind of hernia could also be: In your scrotum, if you are female. In folds of skin around your vagina, if you are female. In your upper thigh (femoral hernia). Inside your belly (hiatal hernia). This happens when your stomach slides above the muscle between your belly and your chest (diaphragm). If your hernia is small and it does not cause pain, you may not need treatment. If your hernia is large or it causes pain, you may need surgery. Follow these instructions at home: Activity Avoid stretching or overusing (straining) the muscles near your hernia. Straining can happen when you: Lift something heavy. Poop (have a bowel movement). Do not lift anything that is heavier than 10 lb (4.5 kg), or the limit that you are told, until your doctor says that it is safe. Use the strength of your legs when you lift something heavy. Do not use only your back muscles to lift. General instructions Do these things if told by your doctor so you do not have trouble pooping (constipation): Drink enough fluid to keep your pee (urine) pale yellow. Eat foods that are high in fiber. These include fresh fruits and vegetables, whole grains, and beans. Limit foods that are high in fat and processed sugars. These include foods that are fried or sweet. Take medicine for trouble pooping. When you cough, try to cough gently. You may try to push your hernia in by very gently pressing on it when you are lying down. Do not try to force the bulge back in if it will not push in easily. If you are overweight, work with your  doctor to lose weight safely. Do not use any products that have nicotine or tobacco in them. These include cigarettes and e-cigarettes. If you need help quitting, ask your doctor. If you will be having surgery (hernia repair), watch your hernia for changes in shape, size, or color. Tell your doctor if you see any changes. Take over-the-counter and prescription medicines only as told by your doctor. Keep all follow-up visits as told by your doctor. Contact a doctor if: You get new pain, swelling, or redness near your hernia. You poop fewer times in a week than normal. You have trouble pooping. You have poop (stool) that is more dry than normal. You have poop that is harder or larger than normal. Get help right away if: You have a fever. You have belly pain that gets worse. You feel sick to your stomach (nauseous). You throw up (vomit). Your hernia cannot be pushed in by very gently pressing on it when you are lying down. Do not try to force the bulge back in if it will not push in easily. Your hernia: Changes in shape or size. Changes color. Feels hard or it hurts when you touch it. These symptoms may represent a serious problem that is an emergency. Do not wait to see if the symptoms will go away. Get medical help right away. Call your local emergency services (911 in the [place] Summary A hernia happens when tissue inside your body pushes out through a weak spot in the   belly muscles. This creates a bulge. If your hernia is small and it does not hurt, you may not need treatment. If your hernia is large or it hurts, you may need surgery. If you will be having surgery, watch your hernia for changes in shape, size, or color. Tell your doctor about any changes. This information is not intended to replace advice given to you by your health care provider. Make sure you discuss any questions you have with your health care provider. Document Revised: 10/11/2018 Document Reviewed:  03/22/2017 Elsevier Patient Education  2021 Elsevier Inc.  

## 2020-09-29 ENCOUNTER — Other Ambulatory Visit: Payer: Self-pay | Admitting: *Deleted

## 2020-09-29 DIAGNOSIS — R109 Unspecified abdominal pain: Secondary | ICD-10-CM

## 2020-09-29 NOTE — Progress Notes (Signed)
Change in CT order per Radiology request.  Approved by Dr Debroah Loop.

## 2020-10-06 ENCOUNTER — Ambulatory Visit (HOSPITAL_BASED_OUTPATIENT_CLINIC_OR_DEPARTMENT_OTHER): Admission: RE | Admit: 2020-10-06 | Payer: Medicaid Other | Source: Ambulatory Visit

## 2020-10-13 ENCOUNTER — Other Ambulatory Visit: Payer: Self-pay

## 2020-10-13 ENCOUNTER — Ambulatory Visit (HOSPITAL_BASED_OUTPATIENT_CLINIC_OR_DEPARTMENT_OTHER)
Admission: RE | Admit: 2020-10-13 | Discharge: 2020-10-13 | Disposition: A | Discharge status: Home / Self Care | Payer: Medicaid Other | Source: Ambulatory Visit | Attending: Obstetrics & Gynecology | Admitting: Obstetrics & Gynecology

## 2020-10-13 DIAGNOSIS — R109 Unspecified abdominal pain: Secondary | ICD-10-CM | POA: Insufficient documentation

## 2020-10-13 MED ORDER — IOHEXOL 300 MG/ML  SOLN
80.0000 mL | Freq: Once | INTRAMUSCULAR | Status: AC | PRN
  Administered 2020-10-13: 80 mL via INTRAVENOUS

## 2021-03-19 ENCOUNTER — Other Ambulatory Visit: Payer: Self-pay

## 2021-03-19 ENCOUNTER — Inpatient Hospital Stay (HOSPITAL_COMMUNITY)
Admission: AD | Admit: 2021-03-19 | Discharge: 2021-03-19 | Disposition: A | Discharge status: Home / Self Care | Payer: Medicaid Other | Attending: Obstetrics and Gynecology | Admitting: Obstetrics and Gynecology

## 2021-03-19 ENCOUNTER — Encounter (HOSPITAL_COMMUNITY): Payer: Self-pay | Admitting: Obstetrics and Gynecology

## 2021-03-19 ENCOUNTER — Inpatient Hospital Stay (HOSPITAL_COMMUNITY): Payer: Medicaid Other

## 2021-03-19 DIAGNOSIS — O26899 Other specified pregnancy related conditions, unspecified trimester: Secondary | ICD-10-CM

## 2021-03-19 DIAGNOSIS — O210 Mild hyperemesis gravidarum: Secondary | ICD-10-CM

## 2021-03-19 DIAGNOSIS — O219 Vomiting of pregnancy, unspecified: Secondary | ICD-10-CM | POA: Diagnosis not present

## 2021-03-19 DIAGNOSIS — Z3491 Encounter for supervision of normal pregnancy, unspecified, first trimester: Secondary | ICD-10-CM

## 2021-03-19 DIAGNOSIS — Z3A08 8 weeks gestation of pregnancy: Secondary | ICD-10-CM | POA: Diagnosis not present

## 2021-03-19 DIAGNOSIS — R109 Unspecified abdominal pain: Secondary | ICD-10-CM | POA: Diagnosis not present

## 2021-03-19 DIAGNOSIS — O26891 Other specified pregnancy related conditions, first trimester: Secondary | ICD-10-CM | POA: Diagnosis not present

## 2021-03-19 LAB — CBC
HCT: 38.8 % (ref 36.0–46.0)
Hemoglobin: 13 g/dL (ref 12.0–15.0)
MCH: 29.1 pg (ref 26.0–34.0)
MCHC: 33.5 g/dL (ref 30.0–36.0)
MCV: 86.8 fL (ref 80.0–100.0)
Platelets: 299 10*3/uL (ref 150–400)
RBC: 4.47 MIL/uL (ref 3.87–5.11)
RDW: 12.6 % (ref 11.5–15.5)
WBC: 8.4 10*3/uL (ref 4.0–10.5)
nRBC: 0 % (ref 0.0–0.2)

## 2021-03-19 LAB — URINALYSIS, ROUTINE W REFLEX MICROSCOPIC
Bilirubin Urine: NEGATIVE
Glucose, UA: NEGATIVE mg/dL
Ketones, ur: NEGATIVE mg/dL
Leukocytes,Ua: NEGATIVE
Nitrite: NEGATIVE
Protein, ur: NEGATIVE mg/dL
Specific Gravity, Urine: 1.027 (ref 1.005–1.030)
pH: 5 (ref 5.0–8.0)

## 2021-03-19 LAB — POCT PREGNANCY, URINE: Preg Test, Ur: POSITIVE — AB

## 2021-03-19 LAB — HCG, QUANTITATIVE, PREGNANCY: hCG, Beta Chain, Quant, S: 148526 m[IU]/mL — ABNORMAL HIGH (ref <5)

## 2021-03-19 MED ORDER — METOCLOPRAMIDE HCL 10 MG PO TABS: 10.0000 mg | ORAL_TABLET | Freq: Three times a day (TID) | ORAL | 0 refills | Status: DC | PRN

## 2021-03-19 NOTE — Discharge Instructions (Signed)

## 2021-03-19 NOTE — MAU Note (Signed)
Took a preg test a few wks ago, has been in denial.  Has been having sharp pains in lower abd, some low back pain and she can't keep nothing down.

## 2021-03-19 NOTE — MAU Provider Note (Signed)
History     CSN: [id number]  Arrival date and time: 03/19/21 1511   Event Date/Time   First Provider Initiated Contact with Patient 03/19/21 1718      Chief Complaint  Patient presents with   Abdominal Pain   Back Pain   Nausea   Possible Pregnancy   HPI Haley Potts is a 25 y.o. [id number] at [redacted]w[redacted]d who presents with abdominal cramping & nausea/vomiting. Pains started a few days ago. Reports sharp intermittent pains in her lower abdomen. Rates pain 5/10. Hasn't treated symptoms. Also reports nausea & vomiting for the last few weeks. Vomits 1-2 times per day. Hasn't treated symptoms.  Denies vaginal bleeding.   OB History     Gravida  6   Para  3   Term  2   Preterm  1   AB  2   Living  4      SAB  1   IAB  1   Ectopic  0   Multiple  1   Live Births  4           Past Medical History:  Diagnosis Date   Depression    Post partum   * lasted 4 months    Past Surgical History:  Procedure Laterality Date   WISDOM TOOTH EXTRACTION      History reviewed. No pertinent family history.  Social History   Tobacco Use   Smoking status: Never   Smokeless tobacco: Never  Vaping Use   Vaping Use: Never used  Substance Use Topics   Alcohol use: No    Alcohol/week: 0.0 standard drinks   Drug use: No    Allergies: No Known Allergies  No medications prior to admission.    Review of Systems  Constitutional: Negative.   Gastrointestinal:  Positive for abdominal pain, nausea and vomiting. Negative for diarrhea.  Genitourinary: Negative.   Physical Exam   Blood pressure 113/71, pulse 80, temperature 98.9 F (37.2 C), temperature source Oral, resp. rate 17, height 5\' 4"  (1.626 m), weight 74.7 kg, last menstrual period 01/19/2021, SpO2 98 %, unknown if currently breastfeeding.  Physical Exam Vitals and nursing note reviewed.  Constitutional:      Appearance: She is well-developed. She is not ill-appearing.  HENT:     Head: Normocephalic and  atraumatic.  Pulmonary:     Effort: Pulmonary effort is normal. No respiratory distress.  Abdominal:     General: Abdomen is flat.     Palpations: Abdomen is soft.     Tenderness: There is no abdominal tenderness.  Skin:    General: Skin is warm and dry.  Neurological:     Mental Status: She is alert.  Psychiatric:        Mood and Affect: Mood normal.        Behavior: Behavior normal.    MAU Course  Procedures Results for orders placed or performed during the hospital encounter of 03/19/21 (from the past 24 hour(s))  Urinalysis, Routine w reflex microscopic Urine, Clean Catch     Status: Abnormal   Collection Time: 03/19/21  4:13 PM  Result Value Ref Range   Color, Urine YELLOW YELLOW   APPearance HAZY (A) CLEAR   Specific Gravity, Urine 1.027 1.005 - 1.030   pH 5.0 5.0 - 8.0   Glucose, UA NEGATIVE NEGATIVE mg/dL   Hgb urine dipstick MODERATE (A) NEGATIVE   Bilirubin Urine NEGATIVE NEGATIVE   Ketones, ur NEGATIVE NEGATIVE mg/dL   Protein, ur NEGATIVE NEGATIVE  mg/dL   Nitrite NEGATIVE NEGATIVE   Leukocytes,Ua NEGATIVE NEGATIVE   RBC / HPF 6-10 0 - 5 RBC/hpf   WBC, UA 0-5 0 - 5 WBC/hpf   Bacteria, UA RARE (A) NONE SEEN   Squamous Epithelial / LPF 6-10 0 - 5   Mucus PRESENT   Pregnancy, urine POC     Status: Abnormal   Collection Time: 03/19/21  4:15 PM  Result Value Ref Range   Preg Test, Ur POSITIVE (A) NEGATIVE  CBC     Status: None   Collection Time: 03/19/21  5:52 PM  Result Value Ref Range   WBC 8.4 4.0 - 10.5 K/uL   RBC 4.47 3.87 - 5.11 MIL/uL   Hemoglobin 13.0 12.0 - 15.0 g/dL   HCT 16.1 09.6 - 04.5 %   MCV 86.8 80.0 - 100.0 fL   MCH 29.1 26.0 - 34.0 pg   MCHC 33.5 30.0 - 36.0 g/dL   RDW 40.9 81.1 - 91.4 %   Platelets 299 150 - 400 K/uL   nRBC 0.0 0.0 - 0.2 %  hCG, quantitative, pregnancy     Status: Abnormal   Collection Time: 03/19/21  5:52 PM  Result Value Ref Range   hCG, Beta Chain, Quant, S 148,526 (H) <5 mIU/mL   US OB LESS THAN 14 WEEKS WITH OB  TRANSVAGINAL  Result Date: 03/19/2021 CLINICAL DATA:  Abdomen pain EXAM: OBSTETRIC <14 WK [place] AND TRANSVAGINAL OB US TECHNIQUE: Both transabdominal and transvaginal ultrasound examinations were performed for complete evaluation of the gestation as well as the maternal uterus, adnexal regions, and pelvic cul-de-sac. Transvaginal technique was performed to assess early pregnancy. COMPARISON:  None. FINDINGS: Intrauterine gestational sac: Single Yolk sac:  Visualized Embryo:  Visualized Cardiac Activity: Visualized Heart Rate: 175 bpm CRL:  21.7 mm   8 w   5 d                  [place] EDC: 10/24/2021 Subchorionic hemorrhage:  None visualized. Maternal uterus/adnexae: Ovaries are within normal limits. Right ovary measures 4.5 x 2.2 x 2 cm. Left ovary measures 3.6 x 2.5 x 2 cm. No significant free fluid IMPRESSION: Single viable intrauterine pregnancy as above. No specific abnormality is seen Electronically Signed   By: Jasmine Pang M.D.   On: 03/19/2021 18:43    MDM +UPT UA, CBC, ABO/Rh, quant hCG, and [place] today to rule out ectopic pregnancy which can be life threatening.   Ultrasound shows live IUP measuring [redacted]w[redacted]d.   Assessment and Plan   1. Normal IUP (intrauterine pregnancy) on prenatal ultrasound, first trimester  -start prenatal care  2. Abdominal cramping affecting pregnancy  -reviewed SAB precautions  3. Nausea and vomiting during pregnancy prior to [redacted] weeks gestation  -rx reglan  4. [redacted] weeks gestation of pregnancy      Judeth Horn 03/19/2021, 5:18 PM

## 2021-05-25 DIAGNOSIS — F321 Major depressive disorder, single episode, moderate: Secondary | ICD-10-CM | POA: Diagnosis not present

## 2021-05-26 DIAGNOSIS — F321 Major depressive disorder, single episode, moderate: Secondary | ICD-10-CM | POA: Diagnosis not present

## 2021-05-27 DIAGNOSIS — F321 Major depressive disorder, single episode, moderate: Secondary | ICD-10-CM | POA: Diagnosis not present

## 2021-05-28 DIAGNOSIS — F321 Major depressive disorder, single episode, moderate: Secondary | ICD-10-CM | POA: Diagnosis not present

## 2021-05-29 DIAGNOSIS — F321 Major depressive disorder, single episode, moderate: Secondary | ICD-10-CM | POA: Diagnosis not present

## 2021-06-01 DIAGNOSIS — F321 Major depressive disorder, single episode, moderate: Secondary | ICD-10-CM | POA: Diagnosis not present

## 2021-06-02 DIAGNOSIS — F321 Major depressive disorder, single episode, moderate: Secondary | ICD-10-CM | POA: Diagnosis not present

## 2021-06-03 DIAGNOSIS — F321 Major depressive disorder, single episode, moderate: Secondary | ICD-10-CM | POA: Diagnosis not present

## 2021-06-04 DIAGNOSIS — F321 Major depressive disorder, single episode, moderate: Secondary | ICD-10-CM | POA: Diagnosis not present

## 2021-06-14 DIAGNOSIS — F321 Major depressive disorder, single episode, moderate: Secondary | ICD-10-CM | POA: Diagnosis not present

## 2021-06-15 DIAGNOSIS — F321 Major depressive disorder, single episode, moderate: Secondary | ICD-10-CM | POA: Diagnosis not present

## 2021-06-16 DIAGNOSIS — F321 Major depressive disorder, single episode, moderate: Secondary | ICD-10-CM | POA: Diagnosis not present

## 2021-06-17 DIAGNOSIS — F321 Major depressive disorder, single episode, moderate: Secondary | ICD-10-CM | POA: Diagnosis not present

## 2021-06-21 DIAGNOSIS — F321 Major depressive disorder, single episode, moderate: Secondary | ICD-10-CM | POA: Diagnosis not present

## 2021-06-22 DIAGNOSIS — F321 Major depressive disorder, single episode, moderate: Secondary | ICD-10-CM | POA: Diagnosis not present

## 2021-06-23 DIAGNOSIS — F321 Major depressive disorder, single episode, moderate: Secondary | ICD-10-CM | POA: Diagnosis not present

## 2021-06-24 DIAGNOSIS — F321 Major depressive disorder, single episode, moderate: Secondary | ICD-10-CM | POA: Diagnosis not present

## 2021-06-28 DIAGNOSIS — F321 Major depressive disorder, single episode, moderate: Secondary | ICD-10-CM | POA: Diagnosis not present

## 2021-06-29 DIAGNOSIS — F321 Major depressive disorder, single episode, moderate: Secondary | ICD-10-CM | POA: Diagnosis not present

## 2021-06-30 DIAGNOSIS — F321 Major depressive disorder, single episode, moderate: Secondary | ICD-10-CM | POA: Diagnosis not present

## 2021-07-01 DIAGNOSIS — F321 Major depressive disorder, single episode, moderate: Secondary | ICD-10-CM | POA: Diagnosis not present

## 2021-07-04 NOTE — L&D Delivery Note (Signed)
[facility] Delivery Note  Haley Potts is a 26 y.o. [id number] s/p SVD at [redacted]w[redacted]d. She was admitted for SOL.   ROM: 0h 64m with clear fluid GBS Status:  Positive/-- (09/13 1418) Maximum Maternal Temperature:  Temp (24hrs), Avg:98.2 F (36.8 C), Min:98.2 F (36.8 C), Max:98.2 F (36.8 C)    Labor Progress: Patient arrived at 5.5 cm dilation and was monitored expectantly  Delivery Date/Time: 04/09/2022 at 1716 Delivery: Called to room and patient was complete and pushing. Head delivered in ROA position. No nuchal cord present. Shoulder and body delivered in usual fashion. Infant with spontaneous cry, placed on mother's abdomen, dried and stimulated. Cord clamped x 2 after 1-minute delay, and cut by provider. Cord blood drawn. Placenta delivered spontaneously with gentle cord traction. Fundus firm with massage and Pitocin. Labia, perineum, vagina, and cervix inspected with no lacerations.   Placenta: spontaneous, intact, 3 vessel cord Complications: Lower uterine segment was boggy, swept and Methergine given Lacerations: None EBL: 227 Analgesia: None   Infant: APGAR (1 MIN):  8 APGAR (5 MINS):  9 APGAR (10 MINS):    Weight: Pending  Gifford Shave, MD  04/09/2022 6:09 PM

## 2021-07-05 DIAGNOSIS — F321 Major depressive disorder, single episode, moderate: Secondary | ICD-10-CM | POA: Diagnosis not present

## 2021-07-06 DIAGNOSIS — F321 Major depressive disorder, single episode, moderate: Secondary | ICD-10-CM | POA: Diagnosis not present

## 2021-07-07 DIAGNOSIS — F321 Major depressive disorder, single episode, moderate: Secondary | ICD-10-CM | POA: Diagnosis not present

## 2021-07-08 DIAGNOSIS — F321 Major depressive disorder, single episode, moderate: Secondary | ICD-10-CM | POA: Diagnosis not present

## 2021-07-13 DIAGNOSIS — F321 Major depressive disorder, single episode, moderate: Secondary | ICD-10-CM | POA: Diagnosis not present

## 2021-07-14 DIAGNOSIS — F321 Major depressive disorder, single episode, moderate: Secondary | ICD-10-CM | POA: Diagnosis not present

## 2021-07-15 DIAGNOSIS — F321 Major depressive disorder, single episode, moderate: Secondary | ICD-10-CM | POA: Diagnosis not present

## 2021-07-16 DIAGNOSIS — F321 Major depressive disorder, single episode, moderate: Secondary | ICD-10-CM | POA: Diagnosis not present

## 2021-07-19 DIAGNOSIS — F321 Major depressive disorder, single episode, moderate: Secondary | ICD-10-CM | POA: Diagnosis not present

## 2021-07-20 DIAGNOSIS — F321 Major depressive disorder, single episode, moderate: Secondary | ICD-10-CM | POA: Diagnosis not present

## 2021-07-21 DIAGNOSIS — F321 Major depressive disorder, single episode, moderate: Secondary | ICD-10-CM | POA: Diagnosis not present

## 2021-07-22 DIAGNOSIS — F321 Major depressive disorder, single episode, moderate: Secondary | ICD-10-CM | POA: Diagnosis not present

## 2021-07-26 DIAGNOSIS — F321 Major depressive disorder, single episode, moderate: Secondary | ICD-10-CM | POA: Diagnosis not present

## 2021-07-27 DIAGNOSIS — F321 Major depressive disorder, single episode, moderate: Secondary | ICD-10-CM | POA: Diagnosis not present

## 2021-07-28 DIAGNOSIS — F321 Major depressive disorder, single episode, moderate: Secondary | ICD-10-CM | POA: Diagnosis not present

## 2021-07-29 DIAGNOSIS — F321 Major depressive disorder, single episode, moderate: Secondary | ICD-10-CM | POA: Diagnosis not present

## 2021-08-02 DIAGNOSIS — F321 Major depressive disorder, single episode, moderate: Secondary | ICD-10-CM | POA: Diagnosis not present

## 2021-08-03 DIAGNOSIS — F321 Major depressive disorder, single episode, moderate: Secondary | ICD-10-CM | POA: Diagnosis not present

## 2021-08-04 DIAGNOSIS — F321 Major depressive disorder, single episode, moderate: Secondary | ICD-10-CM | POA: Diagnosis not present

## 2021-08-05 DIAGNOSIS — F321 Major depressive disorder, single episode, moderate: Secondary | ICD-10-CM | POA: Diagnosis not present

## 2021-08-09 DIAGNOSIS — F321 Major depressive disorder, single episode, moderate: Secondary | ICD-10-CM | POA: Diagnosis not present

## 2021-08-10 DIAGNOSIS — F321 Major depressive disorder, single episode, moderate: Secondary | ICD-10-CM | POA: Diagnosis not present

## 2021-08-11 DIAGNOSIS — F321 Major depressive disorder, single episode, moderate: Secondary | ICD-10-CM | POA: Diagnosis not present

## 2021-08-12 ENCOUNTER — Other Ambulatory Visit: Payer: Self-pay

## 2021-08-12 ENCOUNTER — Ambulatory Visit (INDEPENDENT_AMBULATORY_CARE_PROVIDER_SITE_OTHER): Payer: Medicaid Other | Admitting: *Deleted

## 2021-08-12 VITALS — BP 119/83 | HR 81

## 2021-08-12 DIAGNOSIS — F321 Major depressive disorder, single episode, moderate: Secondary | ICD-10-CM | POA: Diagnosis not present

## 2021-08-12 DIAGNOSIS — N912 Amenorrhea, unspecified: Secondary | ICD-10-CM | POA: Diagnosis not present

## 2021-08-12 DIAGNOSIS — Z3200 Encounter for pregnancy test, result unknown: Secondary | ICD-10-CM

## 2021-08-12 LAB — POCT URINE PREGNANCY: Preg Test, Ur: POSITIVE — AB

## 2021-08-12 MED ORDER — PRENATE DHA 18-0.6-0.4-300 MG PO CAPS: 1.0000 | ORAL_CAPSULE | Freq: Every day | ORAL | 12 refills | Status: DC

## 2021-08-12 NOTE — Progress Notes (Signed)
Haley Potts presents today for UPT. She has no unusual complaints. LMP: Approximately 07/02/21    OBJECTIVE: Appears well, in no apparent distress.  OB History     Gravida  6   Para  3   Term  2   Preterm  1   AB  2   Living  4      SAB  1   IAB  1   Ectopic  0   Multiple  1   Live Births  4          Home UPT Result: positive In-Office UPT result: positive I have reviewed the patient's medical, obstetrical, social, and family histories, and medications.   ASSESSMENT: Positive pregnancy test  PLAN Prenatal care to be completed at: [facility] PNV RX sent. Declines nausea RX. OB Intake and [place] for viability and dating scheduled at check out.

## 2021-08-16 DIAGNOSIS — F321 Major depressive disorder, single episode, moderate: Secondary | ICD-10-CM | POA: Diagnosis not present

## 2021-08-17 DIAGNOSIS — F321 Major depressive disorder, single episode, moderate: Secondary | ICD-10-CM | POA: Diagnosis not present

## 2021-08-18 DIAGNOSIS — F321 Major depressive disorder, single episode, moderate: Secondary | ICD-10-CM | POA: Diagnosis not present

## 2021-08-19 DIAGNOSIS — F321 Major depressive disorder, single episode, moderate: Secondary | ICD-10-CM | POA: Diagnosis not present

## 2021-08-20 DIAGNOSIS — F321 Major depressive disorder, single episode, moderate: Secondary | ICD-10-CM | POA: Diagnosis not present

## 2021-08-21 DIAGNOSIS — Z3480 Encounter for supervision of other normal pregnancy, unspecified trimester: Secondary | ICD-10-CM | POA: Diagnosis not present

## 2021-08-23 ENCOUNTER — Inpatient Hospital Stay (HOSPITAL_COMMUNITY)
Admission: AD | Admit: 2021-08-23 | Discharge: 2021-08-23 | Disposition: A | Discharge status: Home / Self Care | Payer: Medicaid Other | Attending: Family Medicine | Admitting: Family Medicine

## 2021-08-23 ENCOUNTER — Other Ambulatory Visit: Payer: Self-pay

## 2021-08-23 ENCOUNTER — Encounter (HOSPITAL_COMMUNITY): Payer: Self-pay | Admitting: Family Medicine

## 2021-08-23 DIAGNOSIS — Z3A01 Less than 8 weeks gestation of pregnancy: Secondary | ICD-10-CM | POA: Insufficient documentation

## 2021-08-23 DIAGNOSIS — O219 Vomiting of pregnancy, unspecified: Secondary | ICD-10-CM | POA: Diagnosis not present

## 2021-08-23 DIAGNOSIS — F321 Major depressive disorder, single episode, moderate: Secondary | ICD-10-CM | POA: Diagnosis not present

## 2021-08-23 HISTORY — DX: Anemia, unspecified: D64.9

## 2021-08-23 HISTORY — DX: Unspecified ovarian cyst, unspecified side: N83.209

## 2021-08-23 LAB — URINALYSIS, ROUTINE W REFLEX MICROSCOPIC
Bilirubin Urine: NEGATIVE
Glucose, UA: NEGATIVE mg/dL
Ketones, ur: 5 mg/dL — AB
Leukocytes,Ua: NEGATIVE
Nitrite: NEGATIVE
Protein, ur: 30 mg/dL — AB
Specific Gravity, Urine: 1.027 (ref 1.005–1.030)
pH: 5 (ref 5.0–8.0)

## 2021-08-23 MED ORDER — VITAMIN B-6 50 MG PO TABS: 50.0000 mg | ORAL_TABLET | Freq: Every day | ORAL | 1 refills | Status: AC

## 2021-08-23 MED ORDER — ONDANSETRON 4 MG PO TBDP: 4.0000 mg | ORAL_TABLET | Freq: Once | ORAL | 0 refills | Status: DC

## 2021-08-23 MED ORDER — ONDANSETRON 4 MG PO TBDP
4.0000 mg | ORAL_TABLET | Freq: Once | ORAL | Status: AC
  Administered 2021-08-23: 4 mg via ORAL
  Filled 2021-08-23: qty 1

## 2021-08-23 MED ORDER — DOXYLAMINE-PYRIDOXINE 10-10 MG PO TBEC: 2.0000 | DELAYED_RELEASE_TABLET | Freq: Two times a day (BID) | ORAL | 0 refills | Status: AC | PRN

## 2021-08-23 MED ORDER — ONDANSETRON 4 MG PO TBDP: 4.0000 mg | ORAL_TABLET | Freq: Once | ORAL | 0 refills | Status: AC

## 2021-08-23 NOTE — MAU Note (Signed)
Haley Potts is a 26 y.o. at [redacted]w[redacted]d here in MAU reporting: has not been able to eat since Thursday because of loss of appetite. If she tries to eat she throws up. States feels weak. 3 episodes of emesis in the past 24 hours. Denies pain and bleeding.  Onset of complaint: ongoing  Pain score: 0/10  Vitals:   08/23/21 0853  BP: 114/63  Pulse: 66  Resp: 16  Temp: 98.4 F (36.9 C)  SpO2: 99%     Lab orders placed from triage: none

## 2021-08-23 NOTE — MAU Provider Note (Signed)
History     CSN: [id number]  Arrival date and time: 08/23/21 [id number]   Event Date/Time   First Provider Initiated Contact with Patient 08/23/21 1002      Chief Complaint  26 yo F G7P2134 at [redacted]w[redacted]d by LMP who presents to MAU for four days of nausea and ~2-3 episodes of non-intractable vomiting a day   Nausea   HPI 26 yo F G7P2134 at [redacted]w[redacted]d by LMP who presents to MAU for four days of nausea and ~2-3 episodes of non-intractable vomiting a day  Three episodes of vomiting since yesterday Vomiting everyday since thurs 3-4 times a day Minimal amount Feels nausea intermittently throughout day Hasn't tried any medications Had similar nausea in prior pregnancies Able to keep fluids down  Denies abdominal pain Denies vaginal bleeding Denies urinary symptoms Last ate last night - ate some chips that she wasn't able to keep down   OB History     Gravida  7   Para  3   Term  2   Preterm  1   AB  3   Living  4      SAB  1   IAB  2   Ectopic  0   Multiple  1   Live Births  4           Past Medical History:  Diagnosis Date   Anemia    Depression    Post partum   * lasted 4 months, with first preg   Ovarian cyst    Preterm labor     Past Surgical History:  Procedure Laterality Date   INDUCED ABORTION     WISDOM TOOTH EXTRACTION      Family History  Problem Relation Age of Onset   Healthy Mother    Healthy Father     Social History   Tobacco Use   Smoking status: Never   Smokeless tobacco: Never  Vaping Use   Vaping Use: Never used  Substance Use Topics   Alcohol use: Not Currently   Drug use: No    Allergies: No Known Allergies  Medications Prior to Admission  Medication Sig Dispense Refill Last Dose   Prenat-FeAsp-Meth-FA-DHA w/o A (PRENATE DHA) 18-0.6-0.4-300 MG CAPS Take 1 capsule by mouth daily. 30 capsule 12 08/21/2021   metoCLOPramide (REGLAN) 10 MG tablet Take 1 tablet (10 mg total) by mouth every 8 (eight) hours as needed for up to 20 days for nausea. 60 tablet 0     Review of Systems  Constitutional:  Negative for chills and fever.   Respiratory:  Negative for cough and shortness of breath.   Cardiovascular:  Negative for chest pain.  Gastrointestinal:  Positive for nausea and vomiting. Negative for abdominal pain, constipation and diarrhea.  Endocrine: Negative for polyuria.  Genitourinary:  Negative for dysuria, flank pain, frequency and hematuria.  Skin:  Negative for rash.  Neurological:  Negative for weakness, light-headedness and headaches.  Psychiatric/Behavioral:  Negative for agitation.   Physical Exam   Blood pressure: 114/63 pulse (!) 56, temperature 98.4 F (36.9 C), temperature source Oral, resp. rate 16, height 5\' 4"  (1.626 m), weight 77.7 kg, last menstrual period 07/02/2021, SpO2 99 %, unknown if currently breastfeeding.  Physical Exam Vitals and nursing note reviewed.  Constitutional:      Appearance: Normal appearance. She is not toxic-appearing or diaphoretic.     Comments: Sitting up in bed on phone with empty emesis bag in hand, does not appear distressed  HENT:     Head: Normocephalic.  Cardiovascular:     Rate and Rhythm: Normal rate.     Pulses: Normal pulses.  Pulmonary:     Effort: Pulmonary effort is normal. No respiratory distress.  Abdominal:     General: Abdomen is flat. There is no distension.     Tenderness: There is no abdominal tenderness. There is no right CVA tenderness, left CVA tenderness or guarding.  Skin:    General: Skin is warm.     Capillary Refill: Capillary refill takes less than 2 seconds.  Neurological:     General: No focal deficit present.     Mental Status: She is alert and oriented to person, place, and time.  Psychiatric:        Mood and Affect: Mood normal.    MAU Course  Procedures   MDM 26 yo F [id number] at [redacted]w[redacted]d by LMP who presents to MAU for four days of nausea and ~2-3 episodes of non-intractable vomiting a day. Sometimes triggered by foods. On exam well appearing, no episodes of vomiting while in MAU for last hour. Vital signs stable. UA with  normal specific gravity. Able to keep fluids down.Treated with ODT zofran and sent prescriptions for Diclegis and PRN Zofran to patient's pharmacy.    Assessment and Plan  Nausea and vomiting in first trimester 26 yo F G7P2134 at [redacted]w[redacted]d by LMP who presents to MAU for four days of nausea and ~2-3 episodes of non-intractable vomiting a day. On exam well appearing, no episodes of vomiting while in MAU for last hour. Vital signs stable. UA with normal specific gravity.  - ODT Zofran here - PO trial with crackers and water - Sent prescriptions for Diclegis and PRN Zofran to patient's pharmacy - Discussed avoiding trigger foods including greasy and spicy foods - counseled on continuing to drink fluids throughout day and keeping bottle of water with her so she can continue to drink water throughout day - discussed return precautions including intractable vomiting and not able to keep fluids down - patient has initial OB appt on 3/29 at [facility]  Renard Matter, MD, MPH OB Fellow, Faculty Practice

## 2021-08-23 NOTE — Discharge Instructions (Signed)
You came to the MAU because you are having nausea in your first trimester. We checked your vital signs which were normal. We gave you some pills that helped you feel better. We also sent you prescriptions for two different pills. You can take the Diclegis every night for help with nausea and sleep. If you need additional medications you can take Zofran as needed.  The nausea also usually gets better over time and as you enter your second trimester. In the meantime it might help to avoid food triggers including greasy foods and spicy foods.

## 2021-08-24 DIAGNOSIS — F321 Major depressive disorder, single episode, moderate: Secondary | ICD-10-CM | POA: Diagnosis not present

## 2021-08-25 DIAGNOSIS — F321 Major depressive disorder, single episode, moderate: Secondary | ICD-10-CM | POA: Diagnosis not present

## 2021-08-26 DIAGNOSIS — F321 Major depressive disorder, single episode, moderate: Secondary | ICD-10-CM | POA: Diagnosis not present

## 2021-08-27 DIAGNOSIS — F321 Major depressive disorder, single episode, moderate: Secondary | ICD-10-CM | POA: Diagnosis not present

## 2021-08-30 DIAGNOSIS — F321 Major depressive disorder, single episode, moderate: Secondary | ICD-10-CM | POA: Diagnosis not present

## 2021-08-31 ENCOUNTER — Inpatient Hospital Stay (HOSPITAL_COMMUNITY)
Admission: AD | Admit: 2021-08-31 | Discharge: 2021-08-31 | Disposition: A | Discharge status: Home / Self Care | Payer: Medicaid Other | Attending: Obstetrics and Gynecology | Admitting: Obstetrics and Gynecology

## 2021-08-31 ENCOUNTER — Other Ambulatory Visit: Payer: Self-pay

## 2021-08-31 ENCOUNTER — Encounter (HOSPITAL_COMMUNITY): Payer: Self-pay | Admitting: Obstetrics and Gynecology

## 2021-08-31 DIAGNOSIS — F321 Major depressive disorder, single episode, moderate: Secondary | ICD-10-CM | POA: Diagnosis not present

## 2021-08-31 DIAGNOSIS — Z3491 Encounter for supervision of normal pregnancy, unspecified, first trimester: Secondary | ICD-10-CM

## 2021-08-31 DIAGNOSIS — Z3A08 8 weeks gestation of pregnancy: Secondary | ICD-10-CM | POA: Insufficient documentation

## 2021-08-31 DIAGNOSIS — R109 Unspecified abdominal pain: Secondary | ICD-10-CM | POA: Diagnosis present

## 2021-08-31 DIAGNOSIS — O26891 Other specified pregnancy related conditions, first trimester: Secondary | ICD-10-CM | POA: Insufficient documentation

## 2021-08-31 DIAGNOSIS — M7918 Myalgia, other site: Secondary | ICD-10-CM | POA: Diagnosis not present

## 2021-08-31 LAB — URINALYSIS, ROUTINE W REFLEX MICROSCOPIC
Bilirubin Urine: NEGATIVE
Glucose, UA: NEGATIVE mg/dL
Ketones, ur: NEGATIVE mg/dL
Leukocytes,Ua: NEGATIVE
Nitrite: NEGATIVE
Protein, ur: NEGATIVE mg/dL
Specific Gravity, Urine: 1.009 (ref 1.005–1.030)
pH: 6 (ref 5.0–8.0)

## 2021-08-31 NOTE — Discharge Instructions (Signed)

## 2021-08-31 NOTE — MAU Note (Signed)
PT SAYS  SINCE LAST Tuesday - HAS LOWER ABD PAIN- FEELS CRAMPING- 7. TOOK REG TYLENOL 1 TAB YESTERDAY

## 2021-08-31 NOTE — MAU Provider Note (Signed)
History     CSN: [id number]  Arrival date and time: 08/31/21 [id number]   Event Date/Time   First Provider Initiated Contact with Patient 08/31/21 2031      Chief Complaint  Patient presents with   Abdominal Pain   HPI Haley Potts is a 26 y.o. [id number] at [redacted]w[redacted]d who presents with lower abdominal cramping. She states it started after she left MAU last week. She states the pain is sharp and shooting in nature and worse when she walks at work. She denies any bleeding or leaking. She has not tried anything for the pain. She denies any urinary complaints, denies constipation or diarrhea.   OB History     Gravida  7   Para  3   Term  2   Preterm  1   AB  3   Living  4      SAB  1   IAB  2   Ectopic  0   Multiple  1   Live Births  4           Past Medical History:  Diagnosis Date   Anemia    Depression    Post partum   * lasted 4 months, with first preg   Ovarian cyst    Preterm labor     Past Surgical History:  Procedure Laterality Date   INDUCED ABORTION     WISDOM TOOTH EXTRACTION      Family History  Problem Relation Age of Onset   Healthy Mother    Healthy Father     Social History   Tobacco Use   Smoking status: Never   Smokeless tobacco: Never  Vaping Use   Vaping Use: Never used  Substance Use Topics   Alcohol use: Not Currently   Drug use: No    Allergies: No Known Allergies  Medications Prior to Admission  Medication Sig Dispense Refill Last Dose   Prenat-FeAsp-Meth-FA-DHA w/o A (PRENATE DHA) 18-0.6-0.4-300 MG CAPS Take 1 capsule by mouth daily. 30 capsule 12 08/31/2021   Doxylamine-Pyridoxine 10-10 MG TBEC Take 2 tablets by mouth 2 (two) times daily as needed (nausea). 120 tablet 0 08/29/2021   pyridOXINE (VITAMIN B-6) 50 MG tablet Take 1 tablet (50 mg total) by mouth daily. 30 tablet 1 NONE    Review of Systems  Constitutional: Negative.  Negative for fatigue and fever.  HENT: Negative.    Respiratory: Negative.  Negative  for shortness of breath.   Cardiovascular: Negative.  Negative for chest pain.  Gastrointestinal:  Positive for abdominal pain. Negative for constipation, diarrhea, nausea and vomiting.  Genitourinary: Negative.  Negative for dysuria, vaginal bleeding and vaginal discharge.  Neurological: Negative.  Negative for dizziness and headaches.  Physical Exam   Blood pressure 120/62, pulse 65, temperature 98.7 F (37.1 C), temperature source Oral, resp. rate 20, height 5\' 4"  (1.626 m), weight 77.1 kg, last menstrual period 07/02/2021, unknown if currently breastfeeding.  Physical Exam Vitals and nursing note reviewed.  Constitutional:      General: She is not in acute distress.    Appearance: She is well-developed.  HENT:     Head: Normocephalic.  Eyes:     Pupils: Pupils are equal, round, and reactive to light.  Cardiovascular:     Rate and Rhythm: Normal rate and regular rhythm.     Heart sounds: Normal heart sounds.  Pulmonary:     Effort: Pulmonary effort is normal. No respiratory distress.     Breath sounds: Normal  breath sounds.  Abdominal:     General: Bowel sounds are normal. There is no distension.     Palpations: Abdomen is soft.     Tenderness: There is no abdominal tenderness.  Skin:    General: Skin is warm and dry.  Neurological:     Mental Status: She is alert and oriented to person, place, and time.  Psychiatric:        Mood and Affect: Mood normal.        Behavior: Behavior normal.        Thought Content: Thought content normal.        Judgment: Judgment normal.    MAU Course  Procedures Results for orders placed or performed during the hospital encounter of 08/31/21 (from the past 24 hour(s))  Urinalysis, Routine w reflex microscopic     Status: Abnormal   Collection Time: 08/31/21  8:01 PM  Result Value Ref Range   Color, Urine YELLOW YELLOW   APPearance CLEAR CLEAR   Specific Gravity, Urine 1.009 1.005 - 1.030   pH 6.0 5.0 - 8.0   Glucose, UA NEGATIVE  NEGATIVE mg/dL   Hgb urine dipstick SMALL (A) NEGATIVE   Bilirubin Urine NEGATIVE NEGATIVE   Ketones, ur NEGATIVE NEGATIVE mg/dL   Protein, ur NEGATIVE NEGATIVE mg/dL   Nitrite NEGATIVE NEGATIVE   Leukocytes,Ua NEGATIVE NEGATIVE   RBC / HPF 0-5 0 - 5 RBC/hpf   WBC, UA 0-5 0 - 5 WBC/hpf   Bacteria, UA RARE (A) NONE SEEN   Squamous Epithelial / LPF 0-5 0 - 5   Mucus PRESENT      MDM UA, UC Pt informed that the ultrasound is considered a limited OB ultrasound and is not intended to be a complete ultrasound exam.  Patient also informed that the ultrasound is not being completed with the intent of assessing for fetal or placental anomalies or any pelvic abnormalities.  Explained that the purpose of todays ultrasound is to assess for  viability.  Patient acknowledges the purpose of the exam and the limitations of the study.     Viable fetus measuring 8 weeks with FHR of 154 bpm  Patient declines pain medication while in MAU  Assessment and Plan   1. Normal intrauterine pregnancy on prenatal ultrasound in first trimester   2. Musculoskeletal pain   3. [redacted] weeks gestation of pregnancy    -Discharge home in stable condition -Abdominal pain precautions discussed -Patient advised to follow-up with OB as scheduled for prenatal care -Patient may return to MAU as needed or if her condition were to change or worsen   Wende Mott CNM 08/31/2021, 8:31 PM

## 2021-09-01 DIAGNOSIS — F321 Major depressive disorder, single episode, moderate: Secondary | ICD-10-CM | POA: Diagnosis not present

## 2021-09-02 DIAGNOSIS — F321 Major depressive disorder, single episode, moderate: Secondary | ICD-10-CM | POA: Diagnosis not present

## 2021-09-02 LAB — CULTURE, OB URINE: Culture: <10000 — AB

## 2021-09-03 DIAGNOSIS — F321 Major depressive disorder, single episode, moderate: Secondary | ICD-10-CM | POA: Diagnosis not present

## 2021-09-06 DIAGNOSIS — F321 Major depressive disorder, single episode, moderate: Secondary | ICD-10-CM | POA: Diagnosis not present

## 2021-09-07 DIAGNOSIS — F321 Major depressive disorder, single episode, moderate: Secondary | ICD-10-CM | POA: Diagnosis not present

## 2021-09-08 DIAGNOSIS — F321 Major depressive disorder, single episode, moderate: Secondary | ICD-10-CM | POA: Diagnosis not present

## 2021-09-09 DIAGNOSIS — F321 Major depressive disorder, single episode, moderate: Secondary | ICD-10-CM | POA: Diagnosis not present

## 2021-09-10 DIAGNOSIS — F321 Major depressive disorder, single episode, moderate: Secondary | ICD-10-CM | POA: Diagnosis not present

## 2021-09-13 ENCOUNTER — Telehealth: Payer: Self-pay

## 2021-09-13 DIAGNOSIS — F321 Major depressive disorder, single episode, moderate: Secondary | ICD-10-CM | POA: Diagnosis not present

## 2021-09-13 NOTE — Telephone Encounter (Signed)
Called patient and left a detailed message on vm informing patient that her appointment on 3/20 will be changed to a virtual appointment. ?

## 2021-09-14 DIAGNOSIS — F321 Major depressive disorder, single episode, moderate: Secondary | ICD-10-CM | POA: Diagnosis not present

## 2021-09-15 DIAGNOSIS — F321 Major depressive disorder, single episode, moderate: Secondary | ICD-10-CM | POA: Diagnosis not present

## 2021-09-16 DIAGNOSIS — F321 Major depressive disorder, single episode, moderate: Secondary | ICD-10-CM | POA: Diagnosis not present

## 2021-09-17 ENCOUNTER — Telehealth: Payer: Self-pay | Admitting: *Deleted

## 2021-09-17 DIAGNOSIS — F321 Major depressive disorder, single episode, moderate: Secondary | ICD-10-CM | POA: Diagnosis not present

## 2021-09-17 NOTE — Telephone Encounter (Signed)
Pt had left message with nurse line with complaints of vaginal pain. ? ?Attempt to return call. No answer, LVM she can contact office as needed.  ?

## 2021-09-18 DIAGNOSIS — F321 Major depressive disorder, single episode, moderate: Secondary | ICD-10-CM | POA: Diagnosis not present

## 2021-09-20 ENCOUNTER — Ambulatory Visit (INDEPENDENT_AMBULATORY_CARE_PROVIDER_SITE_OTHER): Payer: Medicaid Other

## 2021-09-20 ENCOUNTER — Other Ambulatory Visit: Payer: Self-pay

## 2021-09-20 ENCOUNTER — Ambulatory Visit (INDEPENDENT_AMBULATORY_CARE_PROVIDER_SITE_OTHER): Payer: Medicaid Other | Admitting: Licensed Clinical Social Worker

## 2021-09-20 VITALS — BP 120/79 | HR 81 | Ht 64.0 in | Wt 170.8 lb

## 2021-09-20 DIAGNOSIS — F4321 Adjustment disorder with depressed mood: Secondary | ICD-10-CM

## 2021-09-20 DIAGNOSIS — Z3481 Encounter for supervision of other normal pregnancy, first trimester: Secondary | ICD-10-CM

## 2021-09-20 DIAGNOSIS — Z3480 Encounter for supervision of other normal pregnancy, unspecified trimester: Secondary | ICD-10-CM | POA: Insufficient documentation

## 2021-09-20 DIAGNOSIS — Z3491 Encounter for supervision of normal pregnancy, unspecified, first trimester: Secondary | ICD-10-CM | POA: Insufficient documentation

## 2021-09-20 DIAGNOSIS — F321 Major depressive disorder, single episode, moderate: Secondary | ICD-10-CM | POA: Diagnosis not present

## 2021-09-20 MED ORDER — BLOOD PRESSURE KIT DEVI: 1.0000 | 0 refills | Status: DC

## 2021-09-20 NOTE — Progress Notes (Signed)
New OB Intake ? ?I connected with  Haley Potts on 09/20/21 at 10:15 AM EDT by in person and verified that I am speaking with the correct person using two identifiers. Nurse [facility] and pt is located at [place]. ? ?I discussed the limitations, risks, security and privacy concerns of performing an evaluation and management service by telephone and the availability of in person appointments. I also discussed with the patient that there may be a patient responsible charge related to this service. The patient expressed understanding and agreed to proceed. ? ?I explained I am completing New OB Intake today. We discussed her EDD of 04/08/22 that is based on LMP of 07/02/21. Pt is G7/P2134. I reviewed her allergies, medications, Medical/Surgical/OB history, and appropriate screenings. I informed her of [facility] services. Based on history, this is a/an  pregnancy uncomplicated .  ? ?Patient Active Problem List  ? Diagnosis Date Noted  ? 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 ? ?Concerns addressed today ? ?Delivery Plans:  ?Plans to deliver at [facility].  ? ?MyChart/Babyscripts ?MyChart access verified. I explained pt will have some visits in office and some virtually. Babyscripts instructions given and order placed. Patient verifies receipt of registration text/e-mail. Account successfully created and app downloaded. ? ?Blood Pressure Cuff  ?Blood pressure cuff ordered for patient to pick-up from [employer]. Explained after first prenatal appt pt will check weekly and document in Babyscripts. ? ?Weight scale: Patient does have weight scale.  ? ?Anatomy US ?Explained first scheduled [place] will be around 19 weeks.  ? ? ?Labs ?Discussed Avelina Laine genetic screening with patient. Would like both Panorama and Horizon drawn at new OB visit.Also if interested in genetic testing, tell patient she will need AFP 15-21 weeks to complete genetic testing .Routine prenatal labs needed. ? ?Covid Vaccine ?Patient has not covid  vaccine.   ?  ?Is patient interested in [place]? Yes  "Interested in WB - Schedule next visit with CNM" on sticky note ? ?Informed patient of [facility] website  and placed link in her AVS.  ? ?Social Determinants of Health ?Food Insecurity: Patient denies food insecurity. ?WIC Referral: Patient is interested in referral to [facility].  ?Transportation: Patient denies transportation needs. ?Childcare: Discussed no children allowed at ultrasound appointments. Offered childcare services; patient declines childcare services at this time. ? ?Send link to Pregnancy Navigators ? ? ?Placed OB Box on problem list and updated ? ?First visit review ?I reviewed new OB appt with pt. I explained she will have a pelvic exam, ob bloodwork with genetic screening, and PAP smear. Explained pt will be seen by Leticia Penna at first visit; encounter routed to appropriate provider. Explained that patient will be seen by pregnancy navigator following visit with provider. [facility] information placed in AVS.  ? ?Hamilton Capri, RN ?09/20/2021  10:04 AM  ?

## 2021-09-21 DIAGNOSIS — F321 Major depressive disorder, single episode, moderate: Secondary | ICD-10-CM | POA: Diagnosis not present

## 2021-09-21 NOTE — BH Specialist Note (Signed)
Integrated Behavioral Health Initial In-Person Visit ? ?MRN: [id number] ?Name: Haley Potts ? ?Number of Integrated Behavioral Health Clinician visits: 1 ?Session Start time:   11:30am ?Session End time: 12:08pm ?Total time in minutes: 38 mins in person at [facility]  ? ?Types of Service: Individual psychotherapy ? ?Interpretor:No. Interpretor Name and Language: none ? ? Warm Hand Off Completed. ?  ? ?  ? ? ?Subjective: ?Haley Potts is a 26 y.o. female accompanied by n/a ?Patient was referred by A Burch RN for depressed mood and situational stress. ?Patient reports the following symptoms/concerns: depressed mood, feeling overwhelmed, increased stress and lack of social support  ?Duration of problem: over one year ; Severity of problem: mild ? ?Objective: ?Mood: Depressed and Affect: Appropriate ?Risk of harm to self or others: No plan to harm self or others ? ?Life Context: ?Family and Social: Lives with partner and children ?School/Work: employed full time ?Self-Care: n/a ?Life Changes: new pregnancy ? ?Patient and/or Family's Strengths/Protective Factors: ?Concrete supports in place (healthy food, safe environments, etc.) ? ?Goals Addressed: ?Patient will: ?Reduce symptoms of: anxiety and stress ?Increase knowledge and/or ability of: coping skills and stress reduction  ?Demonstrate ability to: Increase healthy adjustment to current life circumstances ? ?Progress towards Goals: ?Ongoing ? ?Interventions: ?Interventions utilized: Supportive Counseling  ?Standardized Assessments completed: PHQ 9 ? ?Patient and/or Family Response: Ms. Augspurger responded well to visit  ? ? ?Assessment: ?Patient currently experiencing adjustment disorder with depressed mood  ?  ?Patient may benefit from integrated behavioral health. ? ?Plan: ?Follow up with behavioral health clinician on : as needed  ?Behavioral recommendations: prioritize rest, stress reducing activity walking, listening to music and journal writing  ?Referral(s):  [facility] (In Clinic) ?"From scale of 1-10, how likely are you to follow plan?":   ? ?Gwyndolyn Saxon, LCSW ? ? ? ? ? ? ? ? ?

## 2021-09-22 DIAGNOSIS — F321 Major depressive disorder, single episode, moderate: Secondary | ICD-10-CM | POA: Diagnosis not present

## 2021-09-28 DIAGNOSIS — F321 Major depressive disorder, single episode, moderate: Secondary | ICD-10-CM | POA: Diagnosis not present

## 2021-09-29 ENCOUNTER — Other Ambulatory Visit: Payer: Self-pay

## 2021-09-29 ENCOUNTER — Ambulatory Visit (INDEPENDENT_AMBULATORY_CARE_PROVIDER_SITE_OTHER): Payer: Medicaid Other | Admitting: Family Medicine

## 2021-09-29 ENCOUNTER — Other Ambulatory Visit (HOSPITAL_COMMUNITY)
Admission: RE | Admit: 2021-09-29 | Discharge: 2021-09-29 | Disposition: A | Discharge status: Home / Self Care | Payer: Medicaid Other | Source: Ambulatory Visit | Attending: Family Medicine | Admitting: Family Medicine

## 2021-09-29 VITALS — BP 118/74 | HR 82 | Wt 171.0 lb

## 2021-09-29 DIAGNOSIS — Z01419 Encounter for gynecological examination (general) (routine) without abnormal findings: Secondary | ICD-10-CM | POA: Diagnosis not present

## 2021-09-29 DIAGNOSIS — O09211 Supervision of pregnancy with history of pre-term labor, first trimester: Secondary | ICD-10-CM | POA: Diagnosis not present

## 2021-09-29 DIAGNOSIS — M6208 Separation of muscle (nontraumatic), other site: Secondary | ICD-10-CM | POA: Diagnosis not present

## 2021-09-29 DIAGNOSIS — Z148 Genetic carrier of other disease: Secondary | ICD-10-CM

## 2021-09-29 DIAGNOSIS — F321 Major depressive disorder, single episode, moderate: Secondary | ICD-10-CM | POA: Diagnosis not present

## 2021-09-29 DIAGNOSIS — Z3481 Encounter for supervision of other normal pregnancy, first trimester: Secondary | ICD-10-CM | POA: Diagnosis not present

## 2021-09-29 DIAGNOSIS — Z3A12 12 weeks gestation of pregnancy: Secondary | ICD-10-CM

## 2021-09-29 NOTE — Progress Notes (Addendum)
? ?History:  ? Haley Potts is a 26 y.o. [phone] at 33w5dby LMP being seen today for her first obstetrical visit.  Her obstetrical history is significant for previous pre-term twins at 399w5dtwo full term deliveries prior to this. Carrier for SMA. Patient does intend to breast and formula feed. Pregnancy history fully reviewed. Considering BTL for contraception.  ? ?Patient reports she overall is doing well. Wants to know if an abdominal hernia will impact this pregnancy. Occasionally has some sharp pain in her lower abdomen randomly that goes away quickly.  ?  ?  ?HISTORY: ?OB History  ?Gravida Para Term Preterm AB Living  ?_0 ?SAB IAB Ectopic Multiple Live Births  ?1 2 0 1 4  ?  ?# Outcome Date GA Lbr Len/2nd Weight Sex Delivery Anes PTL Lv  ?7 Current           ?6 IAB 09/2020          ?5A Preterm 05/01/20 3563w5dF Vag-Spont  Y LIV  ?5B Preterm 05/01/20 35w40w5d Vag-Breech  Y LIV  ?4 Term 06/18/19 38w569w5d1 / 00:02 7 lb 6.2 oz (3.351 kg) M Vag-Spont EPI  LIV  ?   Name: GOODETAMAIYA, BUMP Apgar1: 8  Apgar5: 9  ?3 SAB 07/01/18 [redacted]w[redacted]d [redacted]w[redacted]d   ?2 Term 01/24/17 [redacted]w[redacted]d [redacted]w[redacted]d/ 00:23 7 lb 11.8 oz (3.51 kg) F Vag-Spont EPI  LIV  ?   Name: Korson,GLIZABETH, FELLNERpgar1: 7  Apgar5: 8  ?1 IAB           ?  ?Last pap smear was done 12/2018 and was normal ? ?Past Medical History:  ?Diagnosis Date  ? Anemia   ? Depression   ? Post partum   * lasted 4 months, with first preg  ? Ovarian cyst   ? Preterm labor   ? ?Past Surgical History:  ?Procedure Laterality Date  ? INDUCED ABORTION    ? WISDOM TOOTH EXTRACTION    ? ?Family History  ?Problem Relation Age of Onset  ? Healthy Mother   ? Healthy Father   ? ?Social History  ? ?Tobacco Use  ? Smoking status: Never  ? Smokeless tobacco: Never  ?Vaping Use  ? Vaping Use: Never used  ?Substance Use Topics  ? Alcohol use: Not Currently  ? Drug use: No  ? ?No Known Allergies ?Current Outpatient Medications on File Prior to Visit  ?Medication Sig Dispense Refill  ?  Prenatal MV-Min-Fe Fum-FA-DHA (PRENATAL 1 PO) Take 1 tablet by mouth daily. gummy    ? Blood Pressure Monitoring (BLOOD PRESSURE KIT) DEVI 1 kit by Does not apply route once a week. 1 each 0  ? Doxylamine-Pyridoxine 10-10 MG TBEC Take 2 tablets by mouth 2 (two) times daily as needed (nausea). (Patient not taking: Reported on 09/29/2021) 120 tablet 0  ? ondansetron (ZOFRAN-ODT) 4 MG disintegrating tablet Take by mouth daily. (Patient not taking: Reported on 09/29/2021)    ? pyridOXINE (VITAMIN B-6) 50 MG tablet Take 1 tablet (50 mg total) by mouth daily. (Patient not taking: Reported on 09/29/2021) 30 tablet 1  ? ?No current facility-administered medications on file prior to visit.  ?Indications for ASA therapy (per uptodate) ?One of the following: ?Previous pregnancy with preeclampsia, especially early onset and with an adverse outcome No ?Multifetal gestation No ?Chronic hypertension No ?Type 1 or 2 diabetes mellitus  No ?Chronic kidney disease No ?Autoimmune disease (antiphospholipid syndrome, systemic lupus erythematosus) No ? ?Two or more of the following: ?Nulliparity No ?Obesity (body mass index >30 kg/m2) yes ?Family history of preeclampsia in mother or sister No ?Age ?35 years No ?Sociodemographic characteristics (African American race, low socioeconomic level) Yes ?Personal risk factors (eg, previous pregnancy with low birth weight or small for gestational age infant, previous adverse pregnancy outcome [eg, stillbirth], interval >10 years between pregnancies) No ? ?Review of Systems ?Pertinent items noted in HPI and remainder of comprehensive ROS otherwise negative. ?Physical Exam:  ? ?Vitals:  ? 09/29/21 1033  ?BP: 118/74  ?Pulse: 82  ?Weight: 171 lb (77.6 kg)  ? ?FHT: 156 bpm  ? ?Constitutional: Well-developed, well-nourished pregnant female in no acute distress.  ?HEENT: pupils equal and round  ?Skin: normal color and turgor, no rash ?Cardiovascular: normal rate  ?Respiratory: normal effort ?GI: Abd soft,  non-tender, no hernia present but rectus muscles separate with valsalva  ?MS: Extremities nontender, no edema, normal ROM ?Neurologic: Alert and oriented x 4.  ?Pelvic: NEFG, physiologic discharge, no blood, cervix clean. Pap/swabs collected ? ?Assessment:  ?  ?Pregnancy: [id number] ?Patient Active Problem List  ? Diagnosis Date Noted  ? Supervision of normal pregnancy in first trimester 09/20/2021  ? 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 ?  ?Plan:  ?  ?1. Encounter for supervision of other normal pregnancy in first trimester ?Start ASA.  ?- CBC/D/Plt+RPR+Rh+ABO+RubIgG... ?- Culture, OB Urine ?- Genetic Screening ? ?2. Encounter for cervical Pap smear with pelvic exam ?Normal pelvic.  ?- Cytology - PAP ?- Cervicovaginal ancillary only( [place]) ? ?3. [redacted] weeks gestation of pregnancy ? ?4. 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 ?5. Previous preterm delivery, antepartum, first trimester ?53w5dwith twin gestation. Two full term deliveries prior to this.  ? ?6. Diastasis Recti   ?Reassured this is not a hernia but rather rectus muscle separation and should not impact pregnancy.   ? ?Initial labs drawn. ?Continue prenatal vitamins. ?Problem list reviewed and updated. ?Genetic Screening discussed, ordered  ?Ultrasound discussed; fetal anatomic survey: ordered. ?Anticipatory guidance about prenatal visits given including labs, ultrasounds, and testing. ?Discussed usage of Babyscripts and virtual visits as additional source of managing and completing prenatal visits in midst of coronavirus and pandemic.   ?Encouraged to complete MyChart Registration for her ability to review results, send requests, and have questions addressed.  ?The [facility] with multiple MDs and Advanced Practice Providers was explained to patient; also emphasized that residents, students are part of our team. ?Routine obstetric precautions reviewed. Encouraged to seek out care at office or emergency room  [facility] for urgent and/or emergent concerns. ? ?Return in about 4 weeks (around 10/27/2021) for [place]  ?  ? ?SDarrelyn Hillock DO ?Ob Fellow  ? ?10/01/2021  8:13 AM ? ? ?  ? ? ? ?

## 2021-09-29 NOTE — Progress Notes (Signed)
New OB ?Intake previously completed ?OB Panel, OB Urine, GC/CC today ?Genetic testing offered and accepted ?Depression and anxiety screen positive, has referral to [facility] ?Reports some abdominal pain intermittently, sometimes shooting pain. Reports hx of hernia. ?

## 2021-09-30 DIAGNOSIS — F321 Major depressive disorder, single episode, moderate: Secondary | ICD-10-CM | POA: Diagnosis not present

## 2021-09-30 LAB — CBC/D/PLT+RPR+RH+ABO+RUBIGG...
Antibody Screen: NEGATIVE
Basophils Absolute: 0 10*3/uL (ref 0.0–0.2)
Basos: 0 %
EOS (ABSOLUTE): 0.2 10*3/uL (ref 0.0–0.4)
Eos: 2 %
HCV Ab: NONREACTIVE
HIV Screen 4th Generation wRfx: NONREACTIVE
Hematocrit: 37.3 % (ref 34.0–46.6)
Hemoglobin: 12.6 g/dL (ref 11.1–15.9)
Hepatitis B Surface Ag: NEGATIVE
Immature Grans (Abs): 0 10*3/uL (ref 0.0–0.1)
Immature Granulocytes: 0 %
Lymphocytes Absolute: 2.4 10*3/uL (ref 0.7–3.1)
Lymphs: 33 %
MCH: 28.6 pg (ref 26.6–33.0)
MCHC: 33.8 g/dL (ref 31.5–35.7)
MCV: 85 fL (ref 79–97)
Monocytes Absolute: 0.5 10*3/uL (ref 0.1–0.9)
Monocytes: 7 %
Neutrophils Absolute: 4.2 10*3/uL (ref 1.4–7.0)
Neutrophils: 58 %
Platelets: 277 10*3/uL (ref 150–450)
RBC: 4.4 x10E6/uL (ref 3.77–5.28)
RDW: 13 % (ref 11.7–15.4)
RPR Ser Ql: NONREACTIVE
Rh Factor: POSITIVE
Rubella Antibodies, IGG: 12 index (ref >0.99)
WBC: 7.3 10*3/uL (ref 3.4–10.8)

## 2021-09-30 LAB — CERVICOVAGINAL ANCILLARY ONLY
Chlamydia: NEGATIVE
Comment: NEGATIVE
Comment: NEGATIVE
Comment: NORMAL
Neisseria Gonorrhea: NEGATIVE
Trichomonas: NEGATIVE

## 2021-09-30 LAB — CYTOLOGY - PAP: Diagnosis: NEGATIVE

## 2021-09-30 LAB — HCV INTERPRETATION

## 2021-09-30 MED ORDER — ASPIRIN EC 81 MG PO TBEC: 81.0000 mg | DELAYED_RELEASE_TABLET | Freq: Every day | ORAL | 2 refills | Status: DC

## 2021-10-01 ENCOUNTER — Encounter: Payer: Self-pay | Admitting: Family Medicine

## 2021-10-01 DIAGNOSIS — F321 Major depressive disorder, single episode, moderate: Secondary | ICD-10-CM | POA: Diagnosis not present

## 2021-10-01 LAB — URINE CULTURE, OB REFLEX

## 2021-10-01 LAB — CULTURE, OB URINE

## 2021-10-05 ENCOUNTER — Telehealth: Payer: Self-pay | Admitting: Emergency Medicine

## 2021-10-05 ENCOUNTER — Encounter: Payer: Self-pay | Admitting: Family Medicine

## 2021-10-05 NOTE — Telephone Encounter (Signed)
Attempted call to f/u from [place] message. LM to return call to office. ?

## 2021-10-11 DIAGNOSIS — F321 Major depressive disorder, single episode, moderate: Secondary | ICD-10-CM | POA: Diagnosis not present

## 2021-10-12 DIAGNOSIS — F321 Major depressive disorder, single episode, moderate: Secondary | ICD-10-CM | POA: Diagnosis not present

## 2021-10-13 DIAGNOSIS — F321 Major depressive disorder, single episode, moderate: Secondary | ICD-10-CM | POA: Diagnosis not present

## 2021-10-14 DIAGNOSIS — F321 Major depressive disorder, single episode, moderate: Secondary | ICD-10-CM | POA: Diagnosis not present

## 2021-10-15 DIAGNOSIS — F321 Major depressive disorder, single episode, moderate: Secondary | ICD-10-CM | POA: Diagnosis not present

## 2021-10-18 DIAGNOSIS — F321 Major depressive disorder, single episode, moderate: Secondary | ICD-10-CM | POA: Diagnosis not present

## 2021-10-19 DIAGNOSIS — F321 Major depressive disorder, single episode, moderate: Secondary | ICD-10-CM | POA: Diagnosis not present

## 2021-10-20 DIAGNOSIS — F321 Major depressive disorder, single episode, moderate: Secondary | ICD-10-CM | POA: Diagnosis not present

## 2021-10-21 DIAGNOSIS — F321 Major depressive disorder, single episode, moderate: Secondary | ICD-10-CM | POA: Diagnosis not present

## 2021-10-22 DIAGNOSIS — F321 Major depressive disorder, single episode, moderate: Secondary | ICD-10-CM | POA: Diagnosis not present

## 2021-10-25 DIAGNOSIS — F321 Major depressive disorder, single episode, moderate: Secondary | ICD-10-CM | POA: Diagnosis not present

## 2021-10-26 DIAGNOSIS — F321 Major depressive disorder, single episode, moderate: Secondary | ICD-10-CM | POA: Diagnosis not present

## 2021-10-27 ENCOUNTER — Ambulatory Visit (INDEPENDENT_AMBULATORY_CARE_PROVIDER_SITE_OTHER): Payer: Medicaid Other | Admitting: Advanced Practice Midwife

## 2021-10-27 VITALS — BP 102/65 | HR 71 | Wt 172.0 lb

## 2021-10-27 DIAGNOSIS — Z3481 Encounter for supervision of other normal pregnancy, first trimester: Secondary | ICD-10-CM

## 2021-10-27 DIAGNOSIS — F321 Major depressive disorder, single episode, moderate: Secondary | ICD-10-CM | POA: Diagnosis not present

## 2021-10-27 DIAGNOSIS — Z3A16 16 weeks gestation of pregnancy: Secondary | ICD-10-CM

## 2021-10-27 NOTE — Progress Notes (Signed)
? ?  PRENATAL VISIT NOTE ? ?Subjective:  ?Haley Potts is a 26 y.o. [phone] at [redacted]w[redacted]d being seen today for ongoing prenatal care.  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 ? ?Patient reports  swelling in her hands .  Contractions: Not present. Vag. Bleeding: None.   . Denies leaking of fluid.  ? ?The following portions of the patient's history were reviewed and updated as appropriate: allergies, current medications, past family history, past medical history, past social history, past surgical history and problem list.  ? ?Objective:  ? ?Vitals:  ? 10/27/21 1336  ?BP: 102/65  ?Pulse: 71  ?Weight: 172 lb (78 kg)  ? ? ?Fetal Status: Fetal Heart Rate (bpm): 155        ? ?General:  Alert, oriented and cooperative. Patient is in no acute distress.  ?Skin: Skin is warm and dry. No rash noted.   ?Cardiovascular: Normal heart rate noted  ?Respiratory: Normal respiratory effort, no problems with respiration noted  ?Abdomen: Soft, gravid, appropriate for gestational age.  Pain/Pressure: Absent     ?Pelvic: Cervical exam deferred        ?Extremities: Normal range of motion.  Edema: Trace  ?Mental Status: Normal mood and affect. Normal behavior. Normal judgment and thought content.  ? ?Assessment and Plan:  ?Pregnancy: [id number] at [redacted]w[redacted]d ?1. Encounter for supervision of other normal pregnancy in first trimester ?--Anticipatory guidance about next visits/weeks of pregnancy given.  ? ?2. [redacted] weeks gestation of pregnancy ? ? ?Preterm labor symptoms and general obstetric precautions including but not limited to vaginal bleeding, contractions, leaking of fluid and fetal movement were reviewed in detail with the patient. ?Please refer to After Visit Summary for other counseling recommendations.  ? ?Return in about 4 weeks (around 11/24/2021) for LOB, Any provider. ? ?Future Appointments   ?Date Time Provider Department Center  ?11/15/2021  2:30 PM [facility] NURSE [facility]  ?11/15/2021  2:45 PM [facility]4 [facility]  ?11/24/2021  1:50 PM Corlis Hove, NP [facility] None  ? ? ? ?Sharen Counter, CNM  ?

## 2021-10-28 DIAGNOSIS — F321 Major depressive disorder, single episode, moderate: Secondary | ICD-10-CM | POA: Diagnosis not present

## 2021-10-29 DIAGNOSIS — F321 Major depressive disorder, single episode, moderate: Secondary | ICD-10-CM | POA: Diagnosis not present

## 2021-10-29 LAB — AFP, SERUM, OPEN SPINA BIFIDA
AFP MoM: 0.95
AFP Value: 36.3 ng/mL
Gest. Age on Collection Date: 16.7 weeks
Maternal Age At EDD: 25.2 yr
OSBR Risk 1 IN: 10000
Test Results:: NEGATIVE
Weight: 172 [lb_av]

## 2021-11-01 DIAGNOSIS — F321 Major depressive disorder, single episode, moderate: Secondary | ICD-10-CM | POA: Diagnosis not present

## 2021-11-02 DIAGNOSIS — F321 Major depressive disorder, single episode, moderate: Secondary | ICD-10-CM | POA: Diagnosis not present

## 2021-11-03 DIAGNOSIS — F321 Major depressive disorder, single episode, moderate: Secondary | ICD-10-CM | POA: Diagnosis not present

## 2021-11-04 DIAGNOSIS — F321 Major depressive disorder, single episode, moderate: Secondary | ICD-10-CM | POA: Diagnosis not present

## 2021-11-05 DIAGNOSIS — F321 Major depressive disorder, single episode, moderate: Secondary | ICD-10-CM | POA: Diagnosis not present

## 2021-11-06 DIAGNOSIS — F321 Major depressive disorder, single episode, moderate: Secondary | ICD-10-CM | POA: Diagnosis not present

## 2021-11-08 DIAGNOSIS — F321 Major depressive disorder, single episode, moderate: Secondary | ICD-10-CM | POA: Diagnosis not present

## 2021-11-09 DIAGNOSIS — F321 Major depressive disorder, single episode, moderate: Secondary | ICD-10-CM | POA: Diagnosis not present

## 2021-11-10 DIAGNOSIS — F321 Major depressive disorder, single episode, moderate: Secondary | ICD-10-CM | POA: Diagnosis not present

## 2021-11-11 DIAGNOSIS — F321 Major depressive disorder, single episode, moderate: Secondary | ICD-10-CM | POA: Diagnosis not present

## 2021-11-12 DIAGNOSIS — F321 Major depressive disorder, single episode, moderate: Secondary | ICD-10-CM | POA: Diagnosis not present

## 2021-11-15 ENCOUNTER — Ambulatory Visit: Payer: Medicaid Other | Attending: Advanced Practice Midwife

## 2021-11-15 ENCOUNTER — Encounter: Payer: Self-pay | Admitting: *Deleted

## 2021-11-15 ENCOUNTER — Encounter: Payer: Self-pay | Admitting: Advanced Practice Midwife

## 2021-11-15 ENCOUNTER — Ambulatory Visit: Payer: Medicaid Other | Admitting: *Deleted

## 2021-11-15 ENCOUNTER — Other Ambulatory Visit: Payer: Self-pay | Admitting: *Deleted

## 2021-11-15 VITALS — BP 114/57 | HR 68

## 2021-11-15 DIAGNOSIS — Z148 Genetic carrier of other disease: Secondary | ICD-10-CM | POA: Insufficient documentation

## 2021-11-15 DIAGNOSIS — Z362 Encounter for other antenatal screening follow-up: Secondary | ICD-10-CM

## 2021-11-15 DIAGNOSIS — Z3481 Encounter for supervision of other normal pregnancy, first trimester: Secondary | ICD-10-CM | POA: Insufficient documentation

## 2021-11-15 DIAGNOSIS — O43122 Velamentous insertion of umbilical cord, second trimester: Secondary | ICD-10-CM | POA: Diagnosis not present

## 2021-11-15 DIAGNOSIS — Z363 Encounter for antenatal screening for malformations: Secondary | ICD-10-CM | POA: Diagnosis not present

## 2021-11-15 DIAGNOSIS — O99212 Obesity complicating pregnancy, second trimester: Secondary | ICD-10-CM | POA: Insufficient documentation

## 2021-11-15 DIAGNOSIS — O43199 Other malformation of placenta, unspecified trimester: Secondary | ICD-10-CM | POA: Insufficient documentation

## 2021-11-15 DIAGNOSIS — Z3A19 19 weeks gestation of pregnancy: Secondary | ICD-10-CM | POA: Diagnosis not present

## 2021-11-15 DIAGNOSIS — F321 Major depressive disorder, single episode, moderate: Secondary | ICD-10-CM | POA: Diagnosis not present

## 2021-11-16 DIAGNOSIS — F321 Major depressive disorder, single episode, moderate: Secondary | ICD-10-CM | POA: Diagnosis not present

## 2021-11-17 DIAGNOSIS — F321 Major depressive disorder, single episode, moderate: Secondary | ICD-10-CM | POA: Diagnosis not present

## 2021-11-18 DIAGNOSIS — F321 Major depressive disorder, single episode, moderate: Secondary | ICD-10-CM | POA: Diagnosis not present

## 2021-11-19 DIAGNOSIS — F321 Major depressive disorder, single episode, moderate: Secondary | ICD-10-CM | POA: Diagnosis not present

## 2021-11-22 DIAGNOSIS — F321 Major depressive disorder, single episode, moderate: Secondary | ICD-10-CM | POA: Diagnosis not present

## 2021-11-23 DIAGNOSIS — F321 Major depressive disorder, single episode, moderate: Secondary | ICD-10-CM | POA: Diagnosis not present

## 2021-11-24 ENCOUNTER — Encounter: Payer: Self-pay | Admitting: Student

## 2021-11-24 ENCOUNTER — Ambulatory Visit (INDEPENDENT_AMBULATORY_CARE_PROVIDER_SITE_OTHER): Payer: Medicaid Other | Admitting: Student

## 2021-11-24 VITALS — BP 107/67 | HR 96 | Wt 170.0 lb

## 2021-11-24 DIAGNOSIS — Z148 Genetic carrier of other disease: Secondary | ICD-10-CM

## 2021-11-24 DIAGNOSIS — Z3481 Encounter for supervision of other normal pregnancy, first trimester: Secondary | ICD-10-CM

## 2021-11-24 DIAGNOSIS — O43199 Other malformation of placenta, unspecified trimester: Secondary | ICD-10-CM

## 2021-11-24 DIAGNOSIS — F321 Major depressive disorder, single episode, moderate: Secondary | ICD-10-CM | POA: Diagnosis not present

## 2021-11-24 DIAGNOSIS — Z3A20 20 weeks gestation of pregnancy: Secondary | ICD-10-CM

## 2021-11-24 NOTE — Progress Notes (Signed)
Pt reports fetal movement, denies pain.  

## 2021-11-24 NOTE — Patient Instructions (Signed)

## 2021-11-24 NOTE — Progress Notes (Signed)
   PRENATAL VISIT NOTE  Subjective:  Haley Potts is a 26 y.o. [phone] at [redacted]w[redacted]d being seen today for ongoing prenatal care.  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  Patient reports no complaints.  Contractions: Not present. Vag. Bleeding: None.  Movement: Present. Denies leaking of fluid.   The following portions of the patient's history were reviewed and updated as appropriate: allergies, current medications, past family history, past medical history, past social history, past surgical history and problem list.   Objective:   Vitals:   11/24/21 1359  BP: 107/67  Pulse: 96  Weight: 170 lb (77.1 kg)    Fetal Status: Fetal Heart Rate (bpm): 152 Fundal Height: 21 cm Movement: Present     General:  Alert, oriented and cooperative. Patient is in no acute distress.  Skin: Skin is warm and dry. No rash noted.   Cardiovascular: Normal heart rate noted  Respiratory: Normal respiratory effort, no problems with respiration noted  Abdomen: Soft, gravid, appropriate for gestational age.  Pain/Pressure: Absent     Pelvic: Cervical exam deferred        Extremities: Normal range of motion.  Edema: Trace  Mental Status: Normal mood and affect. Normal behavior. Normal judgment and thought content.   Assessment and Plan:  Pregnancy: [id number] at [redacted]w[redacted]d 1. Encounter for supervision of other normal pregnancy in first trimester - N/V has resolved  2. 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 Marginal insertion of umbilical cord affecting management of mother - Follow-up growth [place] scheduled 6/19  4. [redacted] weeks gestation of pregnancy  - Pt is interested in waterbirth.  No contraindications at this time per chart review/patient assessment.   - Pt to enroll in class, see CNMs for most visits in the  office.  - Discussed waterbirth as option for low-risk pregnancy.  Reviewed conditions that may arise during pregnancy that will risk pt out of waterbirth including hypertension, diabetes, fetal growth restriction <10%ile, etc.   Preterm labor symptoms and general obstetric precautions including but not limited to vaginal bleeding, contractions, leaking of fluid and fetal movement were reviewed in detail with the patient. Please refer to After Visit Summary for other counseling recommendations.   Return in about 4 weeks (around 12/22/2021) for LOB, In-Person, Midwife preferred.  Future Appointments  Date Time Provider Department Center  12/20/2021  3:15 PM [facility] NURSE [facility]  12/20/2021  3:30 PM [facility]2 [facility]    Vonzella Nipple, PA-C

## 2021-11-25 DIAGNOSIS — F321 Major depressive disorder, single episode, moderate: Secondary | ICD-10-CM | POA: Diagnosis not present

## 2021-11-26 DIAGNOSIS — F321 Major depressive disorder, single episode, moderate: Secondary | ICD-10-CM | POA: Diagnosis not present

## 2021-11-29 DIAGNOSIS — F321 Major depressive disorder, single episode, moderate: Secondary | ICD-10-CM | POA: Diagnosis not present

## 2021-11-30 DIAGNOSIS — F321 Major depressive disorder, single episode, moderate: Secondary | ICD-10-CM | POA: Diagnosis not present

## 2021-12-01 DIAGNOSIS — F321 Major depressive disorder, single episode, moderate: Secondary | ICD-10-CM | POA: Diagnosis not present

## 2021-12-02 DIAGNOSIS — F321 Major depressive disorder, single episode, moderate: Secondary | ICD-10-CM | POA: Diagnosis not present

## 2021-12-03 DIAGNOSIS — F321 Major depressive disorder, single episode, moderate: Secondary | ICD-10-CM | POA: Diagnosis not present

## 2021-12-06 DIAGNOSIS — F321 Major depressive disorder, single episode, moderate: Secondary | ICD-10-CM | POA: Diagnosis not present

## 2021-12-07 DIAGNOSIS — F321 Major depressive disorder, single episode, moderate: Secondary | ICD-10-CM | POA: Diagnosis not present

## 2021-12-08 DIAGNOSIS — F321 Major depressive disorder, single episode, moderate: Secondary | ICD-10-CM | POA: Diagnosis not present

## 2021-12-09 DIAGNOSIS — F321 Major depressive disorder, single episode, moderate: Secondary | ICD-10-CM | POA: Diagnosis not present

## 2021-12-10 DIAGNOSIS — F321 Major depressive disorder, single episode, moderate: Secondary | ICD-10-CM | POA: Diagnosis not present

## 2021-12-13 DIAGNOSIS — F321 Major depressive disorder, single episode, moderate: Secondary | ICD-10-CM | POA: Diagnosis not present

## 2021-12-14 DIAGNOSIS — F321 Major depressive disorder, single episode, moderate: Secondary | ICD-10-CM | POA: Diagnosis not present

## 2021-12-15 DIAGNOSIS — F321 Major depressive disorder, single episode, moderate: Secondary | ICD-10-CM | POA: Diagnosis not present

## 2021-12-16 DIAGNOSIS — F321 Major depressive disorder, single episode, moderate: Secondary | ICD-10-CM | POA: Diagnosis not present

## 2021-12-17 DIAGNOSIS — F321 Major depressive disorder, single episode, moderate: Secondary | ICD-10-CM | POA: Diagnosis not present

## 2021-12-20 ENCOUNTER — Ambulatory Visit: Payer: Medicaid Other | Admitting: *Deleted

## 2021-12-20 ENCOUNTER — Encounter: Payer: Self-pay | Admitting: *Deleted

## 2021-12-20 ENCOUNTER — Ambulatory Visit: Payer: Medicaid Other | Attending: Obstetrics

## 2021-12-20 VITALS — BP 107/55 | HR 56

## 2021-12-20 DIAGNOSIS — Z148 Genetic carrier of other disease: Secondary | ICD-10-CM | POA: Diagnosis not present

## 2021-12-20 DIAGNOSIS — Z3481 Encounter for supervision of other normal pregnancy, first trimester: Secondary | ICD-10-CM | POA: Insufficient documentation

## 2021-12-20 DIAGNOSIS — O43122 Velamentous insertion of umbilical cord, second trimester: Secondary | ICD-10-CM | POA: Insufficient documentation

## 2021-12-20 DIAGNOSIS — Z363 Encounter for antenatal screening for malformations: Secondary | ICD-10-CM | POA: Insufficient documentation

## 2021-12-20 DIAGNOSIS — O43199 Other malformation of placenta, unspecified trimester: Secondary | ICD-10-CM

## 2021-12-20 DIAGNOSIS — Z362 Encounter for other antenatal screening follow-up: Secondary | ICD-10-CM | POA: Insufficient documentation

## 2021-12-20 DIAGNOSIS — E669 Obesity, unspecified: Secondary | ICD-10-CM | POA: Diagnosis not present

## 2021-12-20 DIAGNOSIS — O285 Abnormal chromosomal and genetic finding on antenatal screening of mother: Secondary | ICD-10-CM | POA: Diagnosis not present

## 2021-12-20 DIAGNOSIS — O99212 Obesity complicating pregnancy, second trimester: Secondary | ICD-10-CM | POA: Diagnosis not present

## 2021-12-20 DIAGNOSIS — F321 Major depressive disorder, single episode, moderate: Secondary | ICD-10-CM | POA: Diagnosis not present

## 2021-12-20 DIAGNOSIS — O43192 Other malformation of placenta, second trimester: Secondary | ICD-10-CM

## 2021-12-20 DIAGNOSIS — Z3A24 24 weeks gestation of pregnancy: Secondary | ICD-10-CM | POA: Diagnosis not present

## 2021-12-21 ENCOUNTER — Other Ambulatory Visit: Payer: Self-pay | Admitting: *Deleted

## 2021-12-21 DIAGNOSIS — F321 Major depressive disorder, single episode, moderate: Secondary | ICD-10-CM | POA: Diagnosis not present

## 2021-12-21 DIAGNOSIS — O43193 Other malformation of placenta, third trimester: Secondary | ICD-10-CM

## 2021-12-22 ENCOUNTER — Encounter: Payer: Medicaid Other | Admitting: Student

## 2021-12-22 DIAGNOSIS — F321 Major depressive disorder, single episode, moderate: Secondary | ICD-10-CM | POA: Diagnosis not present

## 2021-12-23 DIAGNOSIS — F321 Major depressive disorder, single episode, moderate: Secondary | ICD-10-CM | POA: Diagnosis not present

## 2021-12-24 DIAGNOSIS — F321 Major depressive disorder, single episode, moderate: Secondary | ICD-10-CM | POA: Diagnosis not present

## 2021-12-27 DIAGNOSIS — F321 Major depressive disorder, single episode, moderate: Secondary | ICD-10-CM | POA: Diagnosis not present

## 2021-12-28 DIAGNOSIS — F321 Major depressive disorder, single episode, moderate: Secondary | ICD-10-CM | POA: Diagnosis not present

## 2021-12-29 DIAGNOSIS — F321 Major depressive disorder, single episode, moderate: Secondary | ICD-10-CM | POA: Diagnosis not present

## 2021-12-30 DIAGNOSIS — F321 Major depressive disorder, single episode, moderate: Secondary | ICD-10-CM | POA: Diagnosis not present

## 2021-12-31 DIAGNOSIS — F321 Major depressive disorder, single episode, moderate: Secondary | ICD-10-CM | POA: Diagnosis not present

## 2022-01-03 DIAGNOSIS — F321 Major depressive disorder, single episode, moderate: Secondary | ICD-10-CM | POA: Diagnosis not present

## 2022-01-05 DIAGNOSIS — F321 Major depressive disorder, single episode, moderate: Secondary | ICD-10-CM | POA: Diagnosis not present

## 2022-01-06 ENCOUNTER — Ambulatory Visit (INDEPENDENT_AMBULATORY_CARE_PROVIDER_SITE_OTHER): Payer: Medicaid Other | Admitting: Certified Nurse Midwife

## 2022-01-06 VITALS — BP 102/63 | HR 76 | Wt 169.0 lb

## 2022-01-06 DIAGNOSIS — Z3A26 26 weeks gestation of pregnancy: Secondary | ICD-10-CM

## 2022-01-06 DIAGNOSIS — Z3492 Encounter for supervision of normal pregnancy, unspecified, second trimester: Secondary | ICD-10-CM

## 2022-01-06 DIAGNOSIS — F321 Major depressive disorder, single episode, moderate: Secondary | ICD-10-CM | POA: Diagnosis not present

## 2022-01-06 NOTE — Progress Notes (Signed)
   PRENATAL VISIT NOTE  Subjective:  Haley Potts is a 26 y.o. [phone] at [redacted]w[redacted]d being seen today for ongoing prenatal care.  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  Patient reports no complaints.  Contractions: Not present. Vag. Bleeding: None.  Movement: Present. Denies leaking of fluid.   The following portions of the patient's history were reviewed and updated as appropriate: allergies, current medications, past family history, past medical history, past social history, past surgical history and problem list.   Objective:   Vitals:   01/06/22 1531  BP: 102/63  Pulse: 76  Weight: 169 lb (76.7 kg)    Fetal Status: Fetal Heart Rate (bpm): 154 Fundal Height: 26 cm Movement: Present     General:  Alert, oriented and cooperative. Patient is in no acute distress.  Skin: Skin is warm and dry. No rash noted.   Cardiovascular: Normal heart rate noted  Respiratory: Normal respiratory effort, no problems with respiration noted  Abdomen: Soft, gravid, appropriate for gestational age.  Pain/Pressure: Absent     Pelvic: Cervical exam deferred        Extremities: Normal range of motion.  Edema: None  Mental Status: Normal mood and affect. Normal behavior. Normal judgment and thought content.   Assessment and Plan:  Pregnancy: [id number] at [redacted]w[redacted]d 1. Encounter for supervision of low-risk pregnancy in second trimester - Doing well, feeling regular and vigorous fetal movement   2. [redacted] weeks gestation of pregnancy - Routine OB care including anticipatory guidance re GTT at next visit  Preterm labor symptoms and general obstetric precautions including but not limited to vaginal bleeding, contractions, leaking of fluid and fetal movement were reviewed in detail with the  patient. Please refer to After Visit Summary for other counseling recommendations.   Return in about 2 weeks (around 01/20/2022) for IN-PERSON, LOB/GTT.  Future Appointments  Date Time Provider Department Center  01/27/2022  8:35 AM Levie Heritage, DO [facility] None  01/31/2022 11:15 AM [facility] NURSE [facility]  01/31/2022 11:30 AM [facility] US3 [facility]    Bernerd Limbo, CNM

## 2022-01-07 DIAGNOSIS — F321 Major depressive disorder, single episode, moderate: Secondary | ICD-10-CM | POA: Diagnosis not present

## 2022-01-08 DIAGNOSIS — F321 Major depressive disorder, single episode, moderate: Secondary | ICD-10-CM | POA: Diagnosis not present

## 2022-01-09 DIAGNOSIS — F321 Major depressive disorder, single episode, moderate: Secondary | ICD-10-CM | POA: Diagnosis not present

## 2022-01-10 DIAGNOSIS — F321 Major depressive disorder, single episode, moderate: Secondary | ICD-10-CM | POA: Diagnosis not present

## 2022-01-11 DIAGNOSIS — F321 Major depressive disorder, single episode, moderate: Secondary | ICD-10-CM | POA: Diagnosis not present

## 2022-01-12 DIAGNOSIS — F321 Major depressive disorder, single episode, moderate: Secondary | ICD-10-CM | POA: Diagnosis not present

## 2022-01-13 DIAGNOSIS — F321 Major depressive disorder, single episode, moderate: Secondary | ICD-10-CM | POA: Diagnosis not present

## 2022-01-14 DIAGNOSIS — F321 Major depressive disorder, single episode, moderate: Secondary | ICD-10-CM | POA: Diagnosis not present

## 2022-01-15 DIAGNOSIS — F321 Major depressive disorder, single episode, moderate: Secondary | ICD-10-CM | POA: Diagnosis not present

## 2022-01-16 DIAGNOSIS — F321 Major depressive disorder, single episode, moderate: Secondary | ICD-10-CM | POA: Diagnosis not present

## 2022-01-17 DIAGNOSIS — F321 Major depressive disorder, single episode, moderate: Secondary | ICD-10-CM | POA: Diagnosis not present

## 2022-01-18 DIAGNOSIS — F321 Major depressive disorder, single episode, moderate: Secondary | ICD-10-CM | POA: Diagnosis not present

## 2022-01-19 DIAGNOSIS — F321 Major depressive disorder, single episode, moderate: Secondary | ICD-10-CM | POA: Diagnosis not present

## 2022-01-20 DIAGNOSIS — F321 Major depressive disorder, single episode, moderate: Secondary | ICD-10-CM | POA: Diagnosis not present

## 2022-01-21 DIAGNOSIS — F321 Major depressive disorder, single episode, moderate: Secondary | ICD-10-CM | POA: Diagnosis not present

## 2022-01-22 DIAGNOSIS — F321 Major depressive disorder, single episode, moderate: Secondary | ICD-10-CM | POA: Diagnosis not present

## 2022-01-23 DIAGNOSIS — F321 Major depressive disorder, single episode, moderate: Secondary | ICD-10-CM | POA: Diagnosis not present

## 2022-01-24 DIAGNOSIS — F321 Major depressive disorder, single episode, moderate: Secondary | ICD-10-CM | POA: Diagnosis not present

## 2022-01-25 DIAGNOSIS — F321 Major depressive disorder, single episode, moderate: Secondary | ICD-10-CM | POA: Diagnosis not present

## 2022-01-26 DIAGNOSIS — F321 Major depressive disorder, single episode, moderate: Secondary | ICD-10-CM | POA: Diagnosis not present

## 2022-01-27 ENCOUNTER — Encounter: Payer: Self-pay | Admitting: Family Medicine

## 2022-01-27 ENCOUNTER — Ambulatory Visit (INDEPENDENT_AMBULATORY_CARE_PROVIDER_SITE_OTHER): Payer: Medicaid Other | Admitting: Family Medicine

## 2022-01-27 VITALS — BP 108/65 | HR 103 | Wt 174.0 lb

## 2022-01-27 DIAGNOSIS — F321 Major depressive disorder, single episode, moderate: Secondary | ICD-10-CM | POA: Diagnosis not present

## 2022-01-27 DIAGNOSIS — Z3480 Encounter for supervision of other normal pregnancy, unspecified trimester: Secondary | ICD-10-CM

## 2022-01-27 DIAGNOSIS — O43199 Other malformation of placenta, unspecified trimester: Secondary | ICD-10-CM

## 2022-01-27 NOTE — Progress Notes (Signed)
   PRENATAL VISIT NOTE  Subjective:  Haley Potts is a 26 y.o. [phone] at [redacted]w[redacted]d being seen today for ongoing prenatal care.  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  Patient reports no complaints.  Contractions: Not present. Vag. Bleeding: None.  Movement: Present. Denies leaking of fluid.   The following portions of the patient's history were reviewed and updated as appropriate: allergies, current medications, past family history, past medical history, past social history, past surgical history and problem list.   Objective:   Vitals:   01/27/22 0859  BP: 108/65  Pulse: (!) 103  Weight: 174 lb (78.9 kg)    Fetal Status: Fetal Heart Rate (bpm): 138   Movement: Present     General:  Alert, oriented and cooperative. Patient is in no acute distress.  Skin: Skin is warm and dry. No rash noted.   Cardiovascular: Normal heart rate noted  Respiratory: Normal respiratory effort, no problems with respiration noted  Abdomen: Soft, gravid, appropriate for gestational age.  Pain/Pressure: Present     Pelvic: Cervical exam deferred        Extremities: Normal range of motion.  Edema: None  Mental Status: Normal mood and affect. Normal behavior. Normal judgment and thought content.   Assessment and Plan:  Pregnancy: [id number] at [redacted]w[redacted]d 1. Supervision of other normal pregnancy, antepartum FHT and FH normal Planning on BTL - papers signed today. 2hr GTT today.  2. Marginal insertion of umbilical cord affecting management of mother EFW 29% Has growth [place] on 7/31.   Preterm labor symptoms and general obstetric precautions including but not limited to vaginal bleeding, contractions, leaking of fluid and fetal movement were reviewed in detail with the patient. Please refer to  After Visit Summary for other counseling recommendations.   No follow-ups on file.  Future Appointments  Date Time Provider Department Center  01/31/2022 11:15 AM [facility] NURSE [facility]  01/31/2022 11:30 AM [facility] US3 [facility]    Levie Heritage, DO

## 2022-01-27 NOTE — Progress Notes (Signed)
Pt presents for ROB and 2 gtt labs. Tdap offered; pt declined. Elevated GAD 7 = 8 declined counseling  BTL papers signed today

## 2022-01-28 DIAGNOSIS — F321 Major depressive disorder, single episode, moderate: Secondary | ICD-10-CM | POA: Diagnosis not present

## 2022-01-28 LAB — CBC
Hematocrit: 31.9 % — ABNORMAL LOW (ref 34.0–46.6)
Hemoglobin: 10.8 g/dL — ABNORMAL LOW (ref 11.1–15.9)
MCH: 28.5 pg (ref 26.6–33.0)
MCHC: 33.9 g/dL (ref 31.5–35.7)
MCV: 84 fL (ref 79–97)
Platelets: 292 10*3/uL (ref 150–450)
RBC: 3.79 x10E6/uL (ref 3.77–5.28)
RDW: 12.3 % (ref 11.7–15.4)
WBC: 8.2 10*3/uL (ref 3.4–10.8)

## 2022-01-28 LAB — GLUCOSE TOLERANCE, 2 HOURS W/ 1HR
Glucose, 1 hour: 123 mg/dL (ref 70–179)
Glucose, 2 hour: 128 mg/dL (ref 70–152)
Glucose, Fasting: 87 mg/dL (ref 70–91)

## 2022-01-28 LAB — HIV ANTIBODY (ROUTINE TESTING W REFLEX): HIV Screen 4th Generation wRfx: NONREACTIVE

## 2022-01-28 LAB — RPR: RPR Ser Ql: NONREACTIVE

## 2022-01-29 DIAGNOSIS — F321 Major depressive disorder, single episode, moderate: Secondary | ICD-10-CM | POA: Diagnosis not present

## 2022-01-30 DIAGNOSIS — F321 Major depressive disorder, single episode, moderate: Secondary | ICD-10-CM | POA: Diagnosis not present

## 2022-01-31 ENCOUNTER — Other Ambulatory Visit: Payer: Self-pay | Admitting: *Deleted

## 2022-01-31 ENCOUNTER — Ambulatory Visit: Payer: Medicaid Other | Admitting: *Deleted

## 2022-01-31 ENCOUNTER — Ambulatory Visit: Payer: Medicaid Other | Attending: Obstetrics

## 2022-01-31 VITALS — BP 118/57 | HR 89

## 2022-01-31 DIAGNOSIS — O43199 Other malformation of placenta, unspecified trimester: Secondary | ICD-10-CM

## 2022-01-31 DIAGNOSIS — O43193 Other malformation of placenta, third trimester: Secondary | ICD-10-CM

## 2022-01-31 DIAGNOSIS — Z3A30 30 weeks gestation of pregnancy: Secondary | ICD-10-CM | POA: Diagnosis not present

## 2022-01-31 DIAGNOSIS — Z148 Genetic carrier of other disease: Secondary | ICD-10-CM | POA: Diagnosis not present

## 2022-01-31 DIAGNOSIS — E669 Obesity, unspecified: Secondary | ICD-10-CM

## 2022-01-31 DIAGNOSIS — F321 Major depressive disorder, single episode, moderate: Secondary | ICD-10-CM | POA: Diagnosis not present

## 2022-01-31 DIAGNOSIS — O285 Abnormal chromosomal and genetic finding on antenatal screening of mother: Secondary | ICD-10-CM | POA: Diagnosis not present

## 2022-01-31 DIAGNOSIS — O99213 Obesity complicating pregnancy, third trimester: Secondary | ICD-10-CM

## 2022-02-01 DIAGNOSIS — F321 Major depressive disorder, single episode, moderate: Secondary | ICD-10-CM | POA: Diagnosis not present

## 2022-02-02 DIAGNOSIS — F321 Major depressive disorder, single episode, moderate: Secondary | ICD-10-CM | POA: Diagnosis not present

## 2022-02-03 DIAGNOSIS — F321 Major depressive disorder, single episode, moderate: Secondary | ICD-10-CM | POA: Diagnosis not present

## 2022-02-04 DIAGNOSIS — F321 Major depressive disorder, single episode, moderate: Secondary | ICD-10-CM | POA: Diagnosis not present

## 2022-02-05 DIAGNOSIS — F321 Major depressive disorder, single episode, moderate: Secondary | ICD-10-CM | POA: Diagnosis not present

## 2022-02-06 DIAGNOSIS — F321 Major depressive disorder, single episode, moderate: Secondary | ICD-10-CM | POA: Diagnosis not present

## 2022-02-07 DIAGNOSIS — F321 Major depressive disorder, single episode, moderate: Secondary | ICD-10-CM | POA: Diagnosis not present

## 2022-02-09 ENCOUNTER — Inpatient Hospital Stay (HOSPITAL_COMMUNITY)
Admission: AD | Admit: 2022-02-09 | Discharge: 2022-02-09 | Disposition: A | Discharge status: Home / Self Care | Payer: Medicaid Other | Attending: Obstetrics & Gynecology | Admitting: Obstetrics & Gynecology

## 2022-02-09 ENCOUNTER — Encounter (HOSPITAL_COMMUNITY): Payer: Self-pay | Admitting: Obstetrics & Gynecology

## 2022-02-09 DIAGNOSIS — R609 Edema, unspecified: Secondary | ICD-10-CM | POA: Diagnosis not present

## 2022-02-09 DIAGNOSIS — Z3A31 31 weeks gestation of pregnancy: Secondary | ICD-10-CM | POA: Diagnosis not present

## 2022-02-09 DIAGNOSIS — O26893 Other specified pregnancy related conditions, third trimester: Secondary | ICD-10-CM | POA: Diagnosis not present

## 2022-02-09 DIAGNOSIS — M7989 Other specified soft tissue disorders: Secondary | ICD-10-CM

## 2022-02-09 LAB — URINALYSIS, ROUTINE W REFLEX MICROSCOPIC
Bilirubin Urine: NEGATIVE
Glucose, UA: NEGATIVE mg/dL
Hgb urine dipstick: NEGATIVE
Ketones, ur: NEGATIVE mg/dL
Leukocytes,Ua: NEGATIVE
Nitrite: NEGATIVE
Protein, ur: 30 mg/dL — AB
Specific Gravity, Urine: 1.023 (ref 1.005–1.030)
pH: 6 (ref 5.0–8.0)

## 2022-02-09 NOTE — Discharge Instructions (Signed)

## 2022-02-09 NOTE — MAU Provider Note (Signed)
History     CSN: [id number]  Arrival date and time: 02/09/22 1615   Event Date/Time   First Provider Initiated Contact with Patient 02/09/22 1715      Chief Complaint  Patient presents with   Edema   HPI  Haley Potts is a 26 y.o. [id number] at [redacted]w[redacted]d who presents for evaluation of hand and feet swelling. Patient reports that she noticed around midday that her hands and feet were swollen. She reports that has since resolved. She denies any pain.    She denies any vaginal bleeding, discharge, and leaking of fluid. Denies any constipation, diarrhea or any urinary complaints. Reports normal fetal movement.   OB History     Gravida  7   Para  3   Term  2   Preterm  1   AB  3   Living  4      SAB  1   IAB  2   Ectopic  0   Multiple  1   Live Births  4           Past Medical History:  Diagnosis Date   Anemia    Depression    Post partum   * lasted 4 months, with first preg   Ovarian cyst    Preterm labor     Past Surgical History:  Procedure Laterality Date   INDUCED ABORTION     WISDOM TOOTH EXTRACTION      Family History  Problem Relation Age of Onset   Healthy Mother    Healthy Father     Social History   Tobacco Use   Smoking status: Never   Smokeless tobacco: Never  Vaping Use   Vaping Use: Never used  Substance Use Topics   Alcohol use: Not Currently   Drug use: No    Allergies: No Known Allergies  No medications prior to admission.    Review of Systems  Constitutional: Negative.  Negative for fatigue and fever.  HENT: Negative.    Respiratory: Negative.  Negative for shortness of breath.   Cardiovascular: Negative.  Negative for chest pain.  Gastrointestinal: Negative.  Negative for abdominal pain, constipation, diarrhea, nausea and vomiting.  Genitourinary: Negative.  Negative for dysuria, vaginal bleeding and vaginal discharge.  Neurological: Negative.  Negative for dizziness and headaches.   Physical Exam   Blood  pressure 111/63, pulse 87, temperature 98.1 F (36.7 C), temperature source Oral, resp. rate 20, height 5\' 4"  (1.626 m), weight 78.7 kg, last menstrual period 07/02/2021, SpO2 100 %, unknown if currently breastfeeding.  Patient Vitals for the past 24 hrs:  BP Temp Temp src Pulse Resp SpO2 Height Weight  02/09/22 1730 111/63 -- -- 87 -- -- -- --  02/09/22 1649 (!) 105/54 -- -- -- -- -- -- --  02/09/22 1639 107/62 98.1 F (36.7 C) Oral 92 20 100 % 5\' 4"  (1.626 m) 78.7 kg    Physical Exam Vitals and nursing note reviewed.  Constitutional:      General: She is not in acute distress.    Appearance: She is well-developed.  HENT:     Head: Normocephalic.  Eyes:     Pupils: Pupils are equal, round, and reactive to light.  Cardiovascular:     Rate and Rhythm: Normal rate and regular rhythm.     Heart sounds: Normal heart sounds.  Pulmonary:     Effort: Pulmonary effort is normal. No respiratory distress.     Breath sounds: Normal breath sounds.  Abdominal:     General: Bowel sounds are normal. There is no distension.     Palpations: Abdomen is soft.     Tenderness: There is no abdominal tenderness.  Skin:    General: Skin is warm and dry.  Neurological:     Mental Status: She is alert and oriented to person, place, and time.  Psychiatric:        Mood and Affect: Mood normal.        Behavior: Behavior normal.        Thought Content: Thought content normal.        Judgment: Judgment normal.     Fetal Tracing:  Baseline: 145 Variability: moderate Accels: 15x15 Decels: none  Toco: none   MAU Course  Procedures  Results for orders placed or performed during the hospital encounter of 02/09/22 (from the past 24 hour(s))  Urinalysis, Routine w reflex microscopic     Status: Abnormal   Collection Time: 02/09/22  5:01 PM  Result Value Ref Range   Color, Urine YELLOW YELLOW   APPearance HAZY (A) CLEAR   Specific Gravity, Urine 1.023 1.005 - 1.030   pH 6.0 5.0 - 8.0    Glucose, UA NEGATIVE NEGATIVE mg/dL   Hgb urine dipstick NEGATIVE NEGATIVE   Bilirubin Urine NEGATIVE NEGATIVE   Ketones, ur NEGATIVE NEGATIVE mg/dL   Protein, ur 30 (A) NEGATIVE mg/dL   Nitrite NEGATIVE NEGATIVE   Leukocytes,Ua NEGATIVE NEGATIVE   RBC / HPF 0-5 0 - 5 RBC/hpf   WBC, UA 0-5 0 - 5 WBC/hpf   Bacteria, UA FEW (A) NONE SEEN   Squamous Epithelial / LPF 6-10 0 - 5   Mucus PRESENT      MDM  UA  CNM provided reassurance of normalcy of intermittent swelling in pregnancy. Discussed warning signs of possible high risk conditions related to swelling and when to return to MAU  Assessment and Plan   1. Swelling of both hands   2. [redacted] weeks gestation of pregnancy     -Discharge home in stable condition -Third trimester precautions discussed -Patient advised to follow-up with OB as scheduled for prenatal care -Patient may return to MAU as needed or if her condition were to change or worsen  Rolm Bookbinder, CNM 02/09/2022, 6:46 PM

## 2022-02-09 NOTE — MAU Note (Signed)
.  Haley Potts is a 26 y.o. at [redacted]w[redacted]d here in MAU reporting: hands and feet swelling that has been on-going but got much worse today. She has been having on/off HA's but denies at this time. She reports that she does have dizziness and sees "dots" that comes and goes. She is also having lower abdominal cramping/pressure and pelvic pressure that started last week that is constant (8/10). Denies VB or LOF. Reports good FM.   Onset of complaint: On-going Pain score: 8/10 Vitals:   02/09/22 1639  BP: 107/62  Pulse: 92  Resp: 20  Temp: 98.1 F (36.7 C)  SpO2: 100%     FHT:148 Lab orders placed from triage:  UA

## 2022-02-14 ENCOUNTER — Encounter: Payer: Medicaid Other | Admitting: Obstetrics and Gynecology

## 2022-02-16 DIAGNOSIS — F321 Major depressive disorder, single episode, moderate: Secondary | ICD-10-CM | POA: Diagnosis not present

## 2022-02-17 DIAGNOSIS — F321 Major depressive disorder, single episode, moderate: Secondary | ICD-10-CM | POA: Diagnosis not present

## 2022-02-18 DIAGNOSIS — F321 Major depressive disorder, single episode, moderate: Secondary | ICD-10-CM | POA: Diagnosis not present

## 2022-03-02 ENCOUNTER — Ambulatory Visit (INDEPENDENT_AMBULATORY_CARE_PROVIDER_SITE_OTHER): Payer: Medicaid Other | Admitting: Obstetrics & Gynecology

## 2022-03-02 ENCOUNTER — Encounter: Payer: Self-pay | Admitting: Obstetrics & Gynecology

## 2022-03-02 VITALS — BP 99/62 | HR 87 | Wt 174.0 lb

## 2022-03-02 DIAGNOSIS — O43199 Other malformation of placenta, unspecified trimester: Secondary | ICD-10-CM

## 2022-03-02 DIAGNOSIS — O43193 Other malformation of placenta, third trimester: Secondary | ICD-10-CM

## 2022-03-02 DIAGNOSIS — Z3480 Encounter for supervision of other normal pregnancy, unspecified trimester: Secondary | ICD-10-CM

## 2022-03-02 DIAGNOSIS — Z3483 Encounter for supervision of other normal pregnancy, third trimester: Secondary | ICD-10-CM

## 2022-03-02 DIAGNOSIS — Z3A34 34 weeks gestation of pregnancy: Secondary | ICD-10-CM

## 2022-03-02 NOTE — Patient Instructions (Signed)
Return to office for any scheduled appointments. Call the office or go to the MAU at [facility] [place] if: You begin to have strong, frequent contractions Your water breaks.  Sometimes it is a big gush of fluid, sometimes it is just a trickle that keeps getting your underwear wet or running down your legs You have vaginal bleeding.  It is normal to have a small amount of spotting if your cervix was checked.  You do not feel your baby moving like normal.  If you do not, get something to eat and drink and lay down and focus on feeling your baby move.   If your baby is still not moving like normal, you should call the office or go to MAU. Any other obstetric concerns.  

## 2022-03-02 NOTE — Progress Notes (Signed)
PRENATAL VISIT NOTE  Subjective:  Haley Potts is a 26 y.o. [phone] at [redacted]w[redacted]d being seen today for ongoing prenatal care.  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  Patient reports no complaints.  Contractions: Irritability. Vag. Bleeding: None.  Movement: Present. Denies leaking of fluid.   The following portions of the patient's history were reviewed and updated as appropriate: allergies, current medications, past family history, past medical history, past social history, past surgical history and problem list.   Objective:   Vitals:   03/02/22 1407  BP: 99/62  Pulse: 87  Weight: 174 lb (78.9 kg)    Fetal Status: Fetal Heart Rate (bpm): 151   Movement: Present     General:  Alert, oriented and cooperative. Patient is in no acute distress.  Skin: Skin is warm and dry. No rash noted.   Cardiovascular: Normal heart rate noted  Respiratory: Normal respiratory effort, no problems with respiration noted  Abdomen: Soft, gravid, appropriate for gestational age.  Pain/Pressure: Present     Pelvic: Cervical exam deferred        Extremities: Normal range of motion.  Edema: Trace  Mental Status: Normal mood and affect. Normal behavior. Normal judgment and thought content.   [place] MFM OB FOLLOW UP  Result Date: 01/31/2022 ----------------------------------------------------------------------  OBSTETRICS REPORT                    (Corrected Final 01/31/2022 12:25 pm) ---------------------------------------------------------------------- Patient Info  ID #:       [id number]                          D.O.B.:  Mar 09, 1996 (26 yrs)  Name:       Haley Potts                Visit Date: 01/31/2022 11:49 am ---------------------------------------------------------------------- Performed By   Attending:        Tama High MD        Ref. Address:     [place]  [place]  Performed By:     Rodrigo Ran BS      [facility]                                 [facility] at                                                             [place] [facility]                                                             [facility]  Referred By:      [facility] ---------------------------------------------------------------------- Orders  #  Description                           Code        Ordered By  1  [place] MFM OB FOLLOW UP                   [phone]    YU FANG ----------------------------------------------------------------------  #  Order #                     Accession #                Episode #  1  [id number]                   [id number]                 [id number] ---------------------------------------------------------------------- Indications  [redacted] weeks gestation of pregnancy                [id number]  Obesity complicating pregnancy, third          O99.213  trimester (pregravid BMI 31)  Marginal insertion of umbilical cord affecting O43.193  management of mother in third trimester  Encounter for other antenatal screening        Z36.2  follow-up  Genetic carrier (SMA)                          Z14.8  LR NIPS/ Neg AFP ---------------------------------------------------------------------- Vital Signs  BP:          118/57 ---------------------------------------------------------------------- Fetal Evaluation  Num Of Fetuses:         1  Fetal Heart Rate(bpm):  167  Cardiac Activity:       Observed  Presentation:           Cephalic  Placenta:               Anterior  P. Cord Insertion:      Marginal insertion  Amniotic  Fluid  AFI FV:      Within normal limits  AFI Sum(cm)     %Tile  Largest Pocket(cm)  15.86           57          5.48  RUQ(cm)       RLQ(cm)       LUQ(cm)        LLQ(cm)  3.19          4.99          2.2            5.48 ---------------------------------------------------------------------- Biometry  BPD:     78.41  mm     G. Age:  31w 3d         71  %    CI:        73.42   %    70 - 86                                                          FL/HC:      19.8   %    19.2 - 21.4  HC:    290.78   mm     G. Age:  32w 0d         61  %    HC/AC:      1.12        0.99 - 1.21  AC:    259.29   mm     G. Age:  30w 1d         35  %    FL/BPD:     73.3   %    71 - 87  FL:      57.49  mm     G. Age:  30w 1d         27  %    FL/AC:      22.2   %    20 - 24  CER:        39  mm     G. Age:  31w 6d         70  %  LV:        3.8  mm  Est. FW:    1563  gm      3 lb 7 oz     36  % ---------------------------------------------------------------------- OB History  Gravidity:    7         Term:   2        Prem:   1        SAB:   1  TOP:          2        Living:  4 ---------------------------------------------------------------------- Gestational Age  LMP:           30w 3d        Date:  07/02/21                 EDD:   04/08/22  U/S Today:     31w 0d                                        EDD:   04/04/22  Best:          [name] 3d  Det. By:  LMP  (07/02/21)          EDD:   04/08/22 ---------------------------------------------------------------------- Anatomy  Cranium:               Appears normal         LVOT:                   Previously seen  Cavum:                 Appears normal         Aortic Arch:            Previously seen  Ventricles:            Appears normal         Ductal Arch:            Previously seen  Choroid Plexus:        Appears normal         Diaphragm:              Appears normal  Cerebellum:            Appears normal         Stomach:                Appears normal, left                                                                         sided  Posterior Fossa:       Previously seen        Abdomen:                Appears normal  Nuchal Fold:           Previously seen        Abdominal Wall:         Previously seen  Face:                  Appears normal         Cord Vessels:           Previously seen                         (orbits and profile)  Lips:                  Appears normal         Kidneys:                Appear normal  Palate:                Previously seen        Bladder:                Appears normal  Thoracic:              Appears normal         Spine:                  Previously seen  Heart:                 Appears normal  Upper Extremities:      Previously seen                         (4CH, axis, and                         situs)  RVOT:                  Previously seen        Lower Extremities:      Previously seen  Other:  Female gender previously seen. Nasal bone visualized. Lenses          visualized previously . VC, 3VV and 3VTV previously visualized. ---------------------------------------------------------------------- Cervix Uterus Adnexa  Cervix  Not visualized (advanced GA >24wks)  Uterus  No abnormality visualized.  Right Ovary  Not visualized.  Left Ovary  Not visualized.  Cul De Sac  No free fluid seen.  Adnexa  No abnormality visualized. ---------------------------------------------------------------------- Impression  Marginal cord insertion.  Patient does not have gestational  diabetes.  Fetal growth is appropriate for gestational age .Amniotic fluid  is normal and good fetal activity is seen .  We reassured the patient of the findings. ---------------------------------------------------------------------- Recommendations  -An appointment was made for her to return in 6 weeks for  fetal growth assessment. ----------------------------------------------------------------------                      Noralee Space, MD Electronically Signed Corrected Final Report  01/31/2022 12:25 pm  ----------------------------------------------------------------------  [place] MFM OB FOLLOW UP  Result Date: 12/20/2021 ----------------------------------------------------------------------  OBSTETRICS REPORT                       (Signed Final 12/20/2021 04:31 pm) ---------------------------------------------------------------------- Patient Info  ID #:       [id number]                          D.O.B.:  1996/01/10 (25 yrs)  Name:       Corey Harold                Visit Date: 12/20/2021 03:33 pm ---------------------------------------------------------------------- Performed By  Attending:        Ma Rings MD         Ref. Address:     [address]                                                             [address]                                                             [place]  [place]                                                             [id number]  Performed By:     Rolm Bookbinder RDMS     Location:         [facility]                                                             [facility] at                                                             [place] for                                                             Women  Referred By:      [facility] ---------------------------------------------------------------------- Orders  #  Description                           Code        Ordered By  1  [place] MFM OB FOLLOW UP                   [phone]    YU FANG ----------------------------------------------------------------------  #  Order #                     Accession #                Episode #  1  [id number]                   [id number]                 [id number] ---------------------------------------------------------------------- Indications  Obesity complicating pregnancy, second         O99.212  trimester (BMI 31)  Marginal insertion of umbilical cord affecting O43.192  management of mother in second trimester  [redacted] weeks  gestation of pregnancy                Z3A.24  Encounter for antenatal screening for          Z36.3  malformations  Genetic carrier (SMA)                          Z14.8  LR NIPS/ Neg AFP ---------------------------------------------------------------------- Vital Signs  BP:          107/55 ---------------------------------------------------------------------- Fetal Evaluation  Num Of Fetuses:         1  Fetal Heart Rate(bpm):  161  Cardiac Activity:  Observed  Presentation:           Breech  Placenta:               Anterior  P. Cord Insertion:      Visualized  Amniotic Fluid  AFI FV:      Within normal limits  AFI Sum(cm)     %Tile       Largest Pocket(cm)  14.97           52          6.53  RUQ(cm)       RLQ(cm)       LUQ(cm)        LLQ(cm)  4.17          6.53          2.72           1.55 ---------------------------------------------------------------------- Biometry  BPD:     60.21  mm     G. Age:  24w 4d         47  %    CI:        71.98   %    70 - 86                                                          FL/HC:      19.0   %    18.7 - 20.9  HC:    225.85   mm     G. Age:  24w 4d         37  %    HC/AC:      1.16        1.05 - 1.21  AC:    194.19   mm     G. Age:  24w 1d         31  %    FL/BPD:     71.2   %    71 - 87  FL:      42.88  mm     G. Age:  24w 0d         25  %    FL/AC:      22.1   %    20 - 24  Est. FW:     666  gm      1 lb 7 oz     29  % ---------------------------------------------------------------------- OB History  Gravidity:    7         Term:   2        Prem:   1        SAB:   1  TOP:          2        Living:  4 ---------------------------------------------------------------------- Gestational Age  LMP:           24w 3d        Date:  07/02/21                 EDD:   04/08/22  U/S Today:     24w 2d  EDD:   04/09/22  Best:          Sharmon Leyden 3d     Det. By:  LMP  (07/02/21)          EDD:   04/08/22  ---------------------------------------------------------------------- Anatomy  Cranium:               Appears normal         LVOT:                   Appears normal  Cavum:                 Appears normal         Aortic Arch:            Previously seen  Ventricles:            Appears normal         Ductal Arch:            Previously seen  Choroid Plexus:        Appears normal         Diaphragm:              Appears normal  Cerebellum:            Appears normal         Stomach:                Appears normal, left                                                                        sided  Posterior Fossa:       Previously seen        Abdomen:                Appears normal  Nuchal Fold:           Appears normal         Abdominal Wall:         Appears nml (cord                                                                        insert, abd wall)  Face:                  Appears normal         Cord Vessels:           Appears normal (3                         (orbits and profile)                           vessel cord)  Lips:                  Appears normal         Kidneys:  Appear normal  Palate:                Appears normal         Bladder:                Appears normal  Thoracic:              Appears normal         Spine:                  Appears normal  Heart:                 Appears normal         Upper Extremities:      Appears normal                         (4CH, axis, and                         situs)  RVOT:                  Appears normal         Lower Extremities:      Appears normal  Other:  Fetus appears to be female. Nasal bone visualized. Lenses visualized          previously . VC, 3VV and 3VTV visualized. Technically difficult due to          fetal position. ---------------------------------------------------------------------- Comments  This patient was seen for a follow up growth scan due to  maternal obesity and a marginal cord insertion. She denies  any problems since her last exam.   She was informed that the fetal growth and amniotic fluid  level appears appropriate for her gestational age.  A follow up exam was scheduled in 6 weeks. ----------------------------------------------------------------------                   Johnell Comings, MD Electronically Signed Final Report   12/20/2021 04:31 pm ----------------------------------------------------------------------   Assessment and Plan:  Pregnancy: [id number] at [redacted]w[redacted]d 1. Marginal insertion of umbilical cord affecting management of mother Follow up scan scheduled for 03/14/22, no other concerns.    2. [redacted] weeks gestation of pregnancy 3. Supervision of other normal pregnancy, antepartum Pelvic cultures next visit.  Preterm labor symptoms and general obstetric precautions including but not limited to vaginal bleeding, contractions, leaking of fluid and fetal movement were reviewed in detail with the patient. Please refer to After Visit Summary for other counseling recommendations.   Return in about 2 weeks (around 03/16/2022) for Pelvic cultures, OFFICE OB VISIT (MD or APP).  Future Appointments  Date Time Provider [place]  03/14/2022 12:45 PM [facility] NURSE [facility]  03/14/2022  1:00 PM [facility] US1 [facility] [place]    Verita Schneiders, MD

## 2022-03-14 ENCOUNTER — Ambulatory Visit: Payer: Medicaid Other | Admitting: *Deleted

## 2022-03-14 ENCOUNTER — Ambulatory Visit: Payer: Medicaid Other | Attending: Obstetrics and Gynecology

## 2022-03-14 VITALS — BP 112/64 | HR 89

## 2022-03-14 DIAGNOSIS — Z3480 Encounter for supervision of other normal pregnancy, unspecified trimester: Secondary | ICD-10-CM | POA: Insufficient documentation

## 2022-03-14 DIAGNOSIS — O285 Abnormal chromosomal and genetic finding on antenatal screening of mother: Secondary | ICD-10-CM

## 2022-03-14 DIAGNOSIS — O43123 Velamentous insertion of umbilical cord, third trimester: Secondary | ICD-10-CM | POA: Diagnosis not present

## 2022-03-14 DIAGNOSIS — O43193 Other malformation of placenta, third trimester: Secondary | ICD-10-CM

## 2022-03-14 DIAGNOSIS — O99213 Obesity complicating pregnancy, third trimester: Secondary | ICD-10-CM | POA: Diagnosis not present

## 2022-03-14 DIAGNOSIS — Z3A36 36 weeks gestation of pregnancy: Secondary | ICD-10-CM | POA: Diagnosis not present

## 2022-03-14 DIAGNOSIS — Z148 Genetic carrier of other disease: Secondary | ICD-10-CM | POA: Insufficient documentation

## 2022-03-14 DIAGNOSIS — O43199 Other malformation of placenta, unspecified trimester: Secondary | ICD-10-CM

## 2022-03-16 ENCOUNTER — Ambulatory Visit (INDEPENDENT_AMBULATORY_CARE_PROVIDER_SITE_OTHER): Payer: Medicaid Other | Admitting: Obstetrics & Gynecology

## 2022-03-16 ENCOUNTER — Other Ambulatory Visit (HOSPITAL_COMMUNITY)
Admission: RE | Admit: 2022-03-16 | Discharge: 2022-03-16 | Disposition: A | Discharge status: Home / Self Care | Payer: Medicaid Other | Source: Ambulatory Visit | Attending: Obstetrics & Gynecology | Admitting: Obstetrics & Gynecology

## 2022-03-16 VITALS — BP 105/69 | HR 98 | Wt 178.3 lb

## 2022-03-16 DIAGNOSIS — Z3A36 36 weeks gestation of pregnancy: Secondary | ICD-10-CM | POA: Insufficient documentation

## 2022-03-16 DIAGNOSIS — O43193 Other malformation of placenta, third trimester: Secondary | ICD-10-CM

## 2022-03-16 DIAGNOSIS — Z3480 Encounter for supervision of other normal pregnancy, unspecified trimester: Secondary | ICD-10-CM | POA: Insufficient documentation

## 2022-03-16 DIAGNOSIS — Z3483 Encounter for supervision of other normal pregnancy, third trimester: Secondary | ICD-10-CM

## 2022-03-16 DIAGNOSIS — O43199 Other malformation of placenta, unspecified trimester: Secondary | ICD-10-CM

## 2022-03-16 NOTE — Progress Notes (Signed)
Patient presents for ROB. Patient has no concerns today. Water birth Certificate given to front desk to scan into chart.

## 2022-03-16 NOTE — Progress Notes (Signed)
PRENATAL VISIT NOTE  Subjective:  Haley Potts is a 26 y.o. [phone] at [redacted]w[redacted]d being seen today for ongoing prenatal care.  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  Patient reports no complaints.  Contractions: Irritability. Vag. Bleeding: None.  Movement: Present. Denies leaking of fluid.   The following portions of the patient's history were reviewed and updated as appropriate: allergies, current medications, past family history, past medical history, past social history, past surgical history and problem list.   Objective:   Vitals:   03/16/22 1402  BP: 105/69  Pulse: 98  Weight: 178 lb 4.8 oz (80.9 kg)    Fetal Status: Fetal Heart Rate (bpm): 140   Movement: Present  Presentation: Vertex  General:  Alert, oriented and cooperative. Patient is in no acute distress.  Skin: Skin is warm and dry. No rash noted.   Cardiovascular: Normal heart rate noted  Respiratory: Normal respiratory effort, no problems with respiration noted  Abdomen: Soft, gravid, appropriate for gestational age.  Pain/Pressure: Present     Pelvic: Cervical exam performed in the presence of a chaperone Dilation: 1 Effacement (%): Thick Station: Ballotable. Cultures done.  Extremities: Normal range of motion.  Edema: Trace  Mental Status: Normal mood and affect. Normal behavior. Normal judgment and thought content.   [place] MFM OB FOLLOW UP  Result Date: 03/14/2022 ----------------------------------------------------------------------  OBSTETRICS REPORT                       (Signed Final 03/14/2022 01:47 pm) ---------------------------------------------------------------------- Patient Info  ID #:       [id number]                          D.O.B.:  March 04, 1996 (26 yrs)  Name:       Haley Potts                 Visit Date: 03/14/2022 12:57 pm ---------------------------------------------------------------------- Performed By  Attending:        Ma Rings MD         Ref. Address:     [address]                                                             [address]                                                             [place]  [phone]  Performed By:     Lowanda FosterBrittany Pharisien     Location[facility]         [facility]                    RDMS                                     [facility] at                                                             [facility]                                                             [facility]  [facility]:      [facility] ---------------------------------------------------------------------- Orders  #  Description                           Code        Ordered By  1  US MFM OB FOLLOW UP                   [id number]    [facility] ----------------------------------------------------------------------  #  Order #                     Accession #                Episode #  1  [id number]                   [phone]                 [id number] ---------------------------------------------------------------------- Indications  Obesity complicating pregnancy, third          O99.213  trimester (pregravid BMI 31)  Marginal insertion of umbilical cord affecting O43.193  management of mother in third trimester  Genetic carrier (SMA)                          Z14.8  LR NIPS/ Neg AFP  [redacted] weeks gestation of pregnancy                Z3A.36 ---------------------------------------------------------------------- Vital Signs                            Pulse:  89  BP:          112/64 ---------------------------------------------------------------------- Fetal Evaluation  Num Of Fetuses:         1  Fetal Heart Rate(bpm):  157  Cardiac Activity:       Observed  Presentation:            Cephalic  Placenta:               Anterior  P. Cord Insertion:      Marginal insertion prev seen  Amniotic Fluid  AFI FV:  Within normal limits  AFI Sum(cm)     %Tile       Largest Pocket(cm)  18.83           71          7.77  RUQ(cm)       RLQ(cm)       LUQ(cm)        LLQ(cm)  5.25          2.99          7.77           2.82 ---------------------------------------------------------------------- Biometry  BPD:     90.29  mm     G. Age:  36w 4d         66  %    CI:        78.69   %    70 - 86                                                          FL/HC:      21.4   %    20.1 - 22.1  HC:    321.89   mm     G. Age:  36w 2d         20  %    HC/AC:      1.02        0.93 - 1.11  AC:    314.18   mm     G. Age:  35w 2d         30  %    FL/BPD:     76.3   %    71 - 87  FL:      68.88  mm     G. Age:  35w 3d         21  %    FL/AC:      21.9   %    20 - 24  HUM:      60.1  mm     G. Age:  35w 0d         37  %  Est. FW:    2716  gm           6 lb     30  % ---------------------------------------------------------------------- OB History  Gravidity:    7         Term:   2        Prem:   1        SAB:   1  TOP:          2        Living:  4 ---------------------------------------------------------------------- Gestational Age  LMP:           36w 3d        Date:  07/02/21                 EDD:   04/08/22  U/S Today:     35w 6d                                        EDD:   04/12/22  Best:  36w 3d     Det. By:  LMP  (07/02/21)          EDD:   04/08/22 ---------------------------------------------------------------------- Anatomy  Cranium:               Appears normal         LVOT:                   Appears normal  Cavum:                 Appears normal         Aortic Arch:            Previously seen  Ventricles:            Previously seen        Ductal Arch:            Previously seen  Choroid Plexus:        Previously seen        Diaphragm:              Appears normal  Cerebellum:            Previously seen         Stomach:                Appears normal, left                                                                        sided  Posterior Fossa:       Previously seen        Abdomen:                Appears normal  Nuchal Fold:           Previously seen        Abdominal Wall:         Previously seen  Face:                  Orbits and profile     Cord Vessels:           Previously seen                         previously seen  Lips:                  Previously seen        Kidneys:                Appear normal  Palate:                Previously seen        Bladder:                Appears normal  Thoracic:              Appears normal         Spine:                  Previously seen  Heart:                 Appears normal  Upper Extremities:      Previously seen                         (4CH, axis, and                         situs)  RVOT:                  Appears normal         Lower Extremities:      Previously seen  Other:  Female gender previously seen. Nasal bone, lenses, VC, 3VV and          3VTV previously visualized. Technically difficult due to advanced          gestational age. ---------------------------------------------------------------------- Cervix Uterus Adnexa  Cervix  Not visualized (advanced GA >24wks)  Uterus  No abnormality visualized.  Right Ovary  Not visualized.  Left Ovary  Not visualized. ---------------------------------------------------------------------- Comments  This patient was seen for a follow up growth scan due to  maternal obesity and a marginal cord insertion. She denies  any problems since her last exam.  She was informed that the fetal growth and amniotic fluid  level appears appropriate for her gestational age.  As the fetal growth is within normal limits, no further exams  were scheduled in our office. ----------------------------------------------------------------------                   Ma Rings, MD Electronically Signed Final Report   03/14/2022 01:47 pm  ----------------------------------------------------------------------   Assessment and Plan:  Pregnancy: [id number] at [redacted]w[redacted]d 1. Marginal insertion of umbilical cord affecting management of mother Normal recent growth. No concerns.  2. [redacted] weeks gestation of pregnancy 3. Supervision of other normal pregnancy, antepartum Cultures done, will follow up results and manage accordingly. - Strep Gp B NAA - Cervicovaginal ancillary only( [place]) Preterm labor symptoms and general obstetric precautions including but not limited to vaginal bleeding, contractions, leaking of fluid and fetal movement were reviewed in detail with the patient. Please refer to After Visit Summary for other counseling recommendations.   Return in about 1 week (around 03/23/2022) for OFFICE OB VISIT (MD or APP).  No future appointments.  Jaynie Collins, MD

## 2022-03-16 NOTE — Patient Instructions (Signed)
Return to office for any scheduled appointments. Call the office or go to the MAU at [facility] if: You begin to have strong, frequent contractions Your water breaks.  Sometimes it is a big gush of fluid, sometimes it is just a trickle that keeps getting your underwear wet or running down your legs You have vaginal bleeding.  It is normal to have a small amount of spotting if your cervix was checked.  You do not feel your baby moving like normal.  If you do not, get something to eat and drink and lay down and focus on feeling your baby move.   If your baby is still not moving like normal, you should call the office or go to MAU. Any other obstetric concerns.  

## 2022-03-17 LAB — CERVICOVAGINAL ANCILLARY ONLY
Chlamydia: NEGATIVE
Comment: NEGATIVE
Comment: NORMAL
Neisseria Gonorrhea: NEGATIVE

## 2022-03-18 LAB — STREP GP B NAA: Strep Gp B NAA: POSITIVE — AB

## 2022-03-21 ENCOUNTER — Encounter: Payer: Self-pay | Admitting: Obstetrics and Gynecology

## 2022-03-22 ENCOUNTER — Ambulatory Visit (INDEPENDENT_AMBULATORY_CARE_PROVIDER_SITE_OTHER): Payer: Medicaid Other

## 2022-03-22 VITALS — BP 109/69 | HR 85 | Wt 175.0 lb

## 2022-03-22 DIAGNOSIS — Z3480 Encounter for supervision of other normal pregnancy, unspecified trimester: Secondary | ICD-10-CM

## 2022-03-22 DIAGNOSIS — Z3483 Encounter for supervision of other normal pregnancy, third trimester: Secondary | ICD-10-CM

## 2022-03-22 DIAGNOSIS — Z3A37 37 weeks gestation of pregnancy: Secondary | ICD-10-CM

## 2022-03-22 NOTE — Patient Instructions (Signed)
  Contraindications to Waterbirth at [facility]:   History of c-section  Preterm birth less than 37 weeks  Thick, particulate meconium-stained fluid  Maternal fever over 101 degrees Fahrenheit  Heavy bleeding or signs of placental abruption  Pre-eclampsia, Chronic hypertension or Gestational Hypertension  Any abnormal fetal heart rate pattern  If epidural analgesia is utilized during labor  Malpresentation  Multiple gestation pregnancy  Active communicable disease   Significant limitation to mobility  Preexisting Diabetes, A2 Gestational diabetes or Uncontrolled A1 Gestational diabetes  Any other indication based on medical provider discretion  Special Considerations: Some maternal conditions that may become contraindications by provider discretion are history of seizure or syncope, especially without documentation of management or resolution.  The patient will be required to leave the birthing tub if there is a situation warranting a more complete  fetal assessment, continuous fetal monitoring and/or the necessity for the infant to be delivered outside  the tub.   

## 2022-03-22 NOTE — Progress Notes (Signed)
   LOW-RISK PREGNANCY OFFICE VISIT  Patient name: Haley Potts MRN [id number]  Date of birth: [birth year] Chief Complaint:   Routine Prenatal Visit  Subjective:   Haley Potts is a 26 y.o. 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  Patient presents today with no complaints.  Patient endorses fetal movement. Patient endorses abdominal cramping, but is unsure if she is having contractions.  Patient denies vaginal concerns including abnormal discharge, leaking of fluid, and bleeding.  Contractions: Not present. Vag. Bleeding: None.  Movement: Present.  Reviewed past medical,surgical, social, obstetrical and family history as well as problem list, medications and allergies.  Objective   Vitals:   03/22/22 1337  BP: 109/69  Pulse: 85  Weight: 175 lb (79.4 kg)  Body mass index is 30.04 kg/m.  Total Weight Gain:-5 lb (-2.268 kg)         Physical Examination:   General appearance: Well appearing, and in no distress  Mental status: Alert, oriented to person, place, and time  Skin: Warm & dry  Cardiovascular: Normal heart rate noted  Respiratory: Normal respiratory effort, no distress  Abdomen: Soft, gravid, nontender, AGA with Fundal Height: 38 cm  Pelvic: Cervical exam deferred           Extremities: Edema: Trace  Fetal Status: Fetal Heart Rate (bpm): 165  Movement: Present   No results found for this or any previous visit (from the past 24 hour(s)).  Assessment & Plan:  Low-risk pregnancy of a 26 y.o., [id number] at [redacted]w[redacted]d with an Estimated Date of Delivery: 04/08/22   1. Supervision of other normal pregnancy, antepartum -Anticipatory guidance for  upcoming appts. -Patient to schedule next appt in 1 weeks for an in-person visit.   2. [redacted] weeks gestation of pregnancy -Doing well. - Pt interested in waterbirth and has attended the class.  - Reviewed conditions in labor that will risk her out of water immersion including thick meconium or blood stained amniotic fluid, non-reassuring fetal status on monitor, excessive bleeding, hypertension, dizziness, use of IV meds, damaged equipment or staffing that does not allow for water immersion, etc.  - The attending midwife must be on the unit for water immersion to begin; pt understands this may delay the start of water immersion. - Reminded pt that signing consent in labor at the hospital also acknowledges they will exit the tub if the attending midwife requests.     Meds: No orders of the defined types were placed in this encounter.  Labs/procedures today:  Lab Orders  No laboratory test(s) ordered today     Reviewed: Preterm labor symptoms and general obstetric precautions including but not limited to vaginal bleeding, contractions, leaking of fluid and fetal movement were reviewed in detail with the patient.  All questions were answered.  Follow-up: No follow-ups on file.  No orders of the defined types were placed in this encounter.  Maryann Conners MSN, CNM 03/22/2022

## 2022-03-28 ENCOUNTER — Encounter: Payer: Medicaid Other | Admitting: Advanced Practice Midwife

## 2022-03-28 NOTE — Progress Notes (Deleted)
   PRENATAL VISIT NOTE  Subjective:  Haley Potts is a 26 y.o. [phone] at [redacted]w[redacted]d being seen today for ongoing prenatal care.  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  Patient reports {sx:14538}.   .  .   . Denies leaking of fluid.   The following portions of the patient's history were reviewed and updated as appropriate: allergies, current medications, past family history, past medical history, past social history, past surgical history and problem list.   Objective:  There were no vitals filed for this visit.  Fetal Status:           General:  Alert, oriented and cooperative. Patient is in no acute distress.  Skin: Skin is warm and dry. No rash noted.   Cardiovascular: Normal heart rate noted  Respiratory: Normal respiratory effort, no problems with respiration noted  Abdomen: Soft, gravid, appropriate for gestational age.        Pelvic: {Blank single:19197::"Cervical exam performed in the presence of a chaperone","Cervical exam deferred"}        Extremities: Normal range of motion.     Mental Status: Normal mood and affect. Normal behavior. Normal judgment and thought content.   Assessment and Plan:  Pregnancy: [id number] at [redacted]w[redacted]d 1. Supervision of other normal pregnancy, antepartum ***  2. Marginal insertion of umbilical cord affecting management of mother --Normal growth  3. [redacted] weeks gestation of pregnancy   {Blank single:19197::"Term","Preterm"} labor symptoms and general obstetric precautions including but not limited to vaginal bleeding, contractions, leaking of fluid and fetal movement were reviewed in detail with the patient. Please refer to After Visit  Summary for other counseling recommendations.   No follow-ups on file.  Future Appointments  Date Time Provider [place]  03/28/2022  3:30 PM Leftwich-Kirby, Kathie Dike, CNM [facility] None    Fatima Blank, CNM

## 2022-04-04 ENCOUNTER — Inpatient Hospital Stay (HOSPITAL_COMMUNITY)
Admission: AD | Admit: 2022-04-04 | Discharge: 2022-04-04 | Disposition: A | Discharge status: Home / Self Care | Payer: Medicaid Other | Attending: Obstetrics and Gynecology | Admitting: Obstetrics and Gynecology

## 2022-04-04 ENCOUNTER — Encounter (HOSPITAL_COMMUNITY): Payer: Self-pay | Admitting: Obstetrics and Gynecology

## 2022-04-04 DIAGNOSIS — Z3A39 39 weeks gestation of pregnancy: Secondary | ICD-10-CM

## 2022-04-04 DIAGNOSIS — O479 False labor, unspecified: Secondary | ICD-10-CM | POA: Diagnosis not present

## 2022-04-04 DIAGNOSIS — O471 False labor at or after 37 completed weeks of gestation: Secondary | ICD-10-CM | POA: Diagnosis not present

## 2022-04-04 DIAGNOSIS — F321 Major depressive disorder, single episode, moderate: Secondary | ICD-10-CM | POA: Diagnosis not present

## 2022-04-04 DIAGNOSIS — Z3689 Encounter for other specified antenatal screening: Secondary | ICD-10-CM

## 2022-04-04 NOTE — MAU Provider Note (Signed)
Per RN: Ms. Haley Potts is a 26 y.o. [phone] at [redacted]w[redacted]d  who presents to MAU today for labor check. No VB or LOF. +FM.   O: BP 118/60   Pulse 93   Temp 99.6 F (37.6 C)   Resp 18   Ht 5\' 4"  (1.626 m)   Wt 80.3 kg   LMP 07/02/2021   BMI 30.38 kg/m   Cervical exam:  Dilation: 1 Effacement (%): Thick Cervical Position: Middle Station: Ballotable Presentation: Vertex Exam by:: K.Wilson,RN  Fetal Monitoring: Baseline: 150 Variability: mod Accelerations: + Decelerations: no Contractions: irreg   A: SIUP at [redacted]w[redacted]d  False labor Reactive NST  P: Discharge home Follow up @[facility]  this week-missed last appt, needs to call and reschedule Labor precautions  Julianne Handler, CNM 04/04/2022 7:00 PM

## 2022-04-04 NOTE — MAU Note (Signed)
.  Haley Potts is a 26 y.o. at [redacted]w[redacted]d here in MAU reporting: Her hand have been swollen for a week. Had miss last week appointment . Stated she has had increased pelvic prssure and pain. Not sure if they are ctx. . Wants to make sure everything is alright. Deneis any vagb leeding or leaking. Good fetal movement reported. Denies any HA or visual changes.  LMP:  Onset of complaint: 1 week Pain score: 7-8 Vitals:   04/04/22 1757  BP: 113/67  Pulse: (!) 103  Resp: 18  Temp: 99.6 F (37.6 C)     FHT:155 Lab orders placed from triage:

## 2022-04-04 NOTE — MAU Note (Signed)
I have communicated with M.Bhambri,CNM and reviewed vital signs:  Vitals:   04/04/22 1757 04/04/22 1823  BP: 113/67 118/60  Pulse: (!) 103 93  Resp: 18   Temp: 99.6 F (37.6 C)     Vaginal exam:  Dilation: 1 Effacement (%): Thick Cervical Position: Middle Station: Ballotable Presentation: Vertex Exam by:: K.Frederic Tones,RN,   Also reviewed contraction pattern and that non-stress test is reactive.  It has been documented that patient is contracting every 2-9 minutes with no cervical change since last exam 2 weeks ago not indicating active labor.  Patient c/o swelling in her hand. Hand are normal in appearence. VSS.  Based on this report provider has given order for discharge.  A discharge order and diagnosis entered by a provider.   Labor discharge instructions reviewed with patient.

## 2022-04-05 DIAGNOSIS — F321 Major depressive disorder, single episode, moderate: Secondary | ICD-10-CM | POA: Diagnosis not present

## 2022-04-06 ENCOUNTER — Ambulatory Visit (INDEPENDENT_AMBULATORY_CARE_PROVIDER_SITE_OTHER): Payer: Medicaid Other | Admitting: Obstetrics and Gynecology

## 2022-04-06 ENCOUNTER — Encounter: Payer: Self-pay | Admitting: Obstetrics and Gynecology

## 2022-04-06 VITALS — BP 120/71 | HR 85 | Wt 179.8 lb

## 2022-04-06 DIAGNOSIS — Z3483 Encounter for supervision of other normal pregnancy, third trimester: Secondary | ICD-10-CM

## 2022-04-06 DIAGNOSIS — Z3A39 39 weeks gestation of pregnancy: Secondary | ICD-10-CM

## 2022-04-06 DIAGNOSIS — O99820 Streptococcus B carrier state complicating pregnancy: Secondary | ICD-10-CM

## 2022-04-06 DIAGNOSIS — F321 Major depressive disorder, single episode, moderate: Secondary | ICD-10-CM | POA: Diagnosis not present

## 2022-04-06 DIAGNOSIS — Z3480 Encounter for supervision of other normal pregnancy, unspecified trimester: Secondary | ICD-10-CM

## 2022-04-06 NOTE — Patient Instructions (Signed)
Signs and Symptoms of Labor Labor is the body's natural process of moving the baby and the placenta out of the uterus. The process of labor usually starts when the baby is full-term, between 39 and 41 weeks of pregnancy. Signs and symptoms that you are close to going into labor As your body prepares for labor and the birth of your baby, you may notice the following symptoms in the weeks and days before true labor starts: Passing a small amount of thick, bloody mucus from your vagina. This is called normal bloody show or losing your mucus plug. This may happen more than a week before labor begins, or right before labor begins, as the opening of the cervix starts to widen (dilate). For some women, the entire mucus plug passes at once. For others, pieces of the mucus plug may gradually pass over several days. Your baby moving (dropping) lower in your pelvis to get into position for birth (lightening). When this happens, you may feel more pressure on your bladder and pelvic bone and less pressure on your ribs. This may make it easier to breathe. It may also cause you to need to urinate more often and have problems with bowel movements. Having "practice contractions," also called Braxton Hicks contractions or false labor. These occur at irregular (unevenly spaced) intervals that are more than 10 minutes apart. False labor contractions are common after exercise or sexual activity. They will stop if you change position, rest, or drink fluids. These contractions are usually mild and do not get stronger over time. They may feel like: A backache or back pain. Mild cramps, similar to menstrual cramps. Tightening or pressure in your abdomen. Other early symptoms include: Nausea or loss of appetite. Diarrhea. Having a sudden burst of energy, or feeling very tired. Mood changes. Having trouble sleeping. Signs and symptoms that labor has begun Signs that you are in labor may include: Having contractions that come  at regular (evenly spaced) intervals and increase in intensity. This may feel like more intense tightening or pressure in your abdomen that moves to your back. Contractions may also feel like rhythmic pain in your upper thighs or back that comes and goes at regular intervals. If you are delivering for the first time, this change in intensity of contractions often occurs at a more gradual pace. If you have given birth before, you may notice a more rapid progression of contraction changes. Feeling pressure in the vaginal area. Your water breaking (rupture of membranes). This is when the sac of fluid that surrounds your baby breaks. Fluid leaking from your vagina may be clear or blood-tinged. Labor usually starts within 24 hours of your water breaking, but it may take longer to begin. Some people may feel a sudden gush of fluid; others may notice repeatedly damp underwear. Follow these instructions at home:  When labor starts, or if your water breaks, call your health care provider or nurse care line. Based on your situation, they will determine when you should go in for an exam. During early labor, you may be able to rest and manage symptoms at home. Some strategies to try at home include: Breathing and relaxation techniques. Taking a warm bath or shower. Listening to music. Using a heating pad on the lower back for pain. If directed, apply heat to the area as often as told by your health care provider. Use the heat source that your health care provider recommends, such as a moist heat pack or a heating pad. Place a   towel between your skin and the heat source. Leave the heat on for 20-30 minutes. Remove the heat if your skin turns bright red. This is especially important if you are unable to feel pain, heat, or cold. You have a greater risk of getting burned. Contact a health care provider if: Your labor has started. Your water breaks. You have nausea, vomiting, or diarrhea. Get help right away  if: You have painful, regular contractions that are 5 minutes apart or less. Labor starts before you are [redacted] weeks along in your pregnancy. You have a fever. You have bright red blood coming from your vagina. You do not feel your baby moving. You have a severe headache with or without vision problems. You have chest pain or shortness of breath. These symptoms may represent a serious problem that is an emergency. Do not wait to see if the symptoms will go away. Get medical help right away. Call your local emergency services (911 in the [place] Do not drive yourself to the hospital. Summary Labor is your body's natural process of moving your baby and the placenta out of your uterus. The process of labor usually starts when your baby is full-term, between 39 and 40 weeks of pregnancy. When labor starts, or if your water breaks, call your health care provider or nurse care line. Based on your situation, they will determine when you should go in for an exam. This information is not intended to replace advice given to you by your health care provider. Make sure you discuss any questions you have with your health care provider. Document Revised: 11/03/2020 Document Reviewed: 11/03/2020 Elsevier Patient Education  2023 [employer] Inc[employer]  

## 2022-04-06 NOTE — Progress Notes (Signed)
Patient presents for ROB visit. No concerns at this time.   

## 2022-04-06 NOTE — Progress Notes (Signed)
   LOW-RISK PREGNANCY OFFICE VISIT Patient name: Haley Potts MRN [id number]  Date of birth: 09-16-1995 Chief Complaint:   Routine Prenatal Visit  History of Present Illness:   SHAYNNA Potts is a 26 y.o. [id number] female at [redacted]w[redacted]d with an Estimated Date of Delivery: 04/08/22 being seen today for ongoing management of a low-risk pregnancy.  Today she reports occasional contractions. Contractions: Irritability. Vag. Bleeding: None.  Movement: Present. denies leaking of fluid. Review of Systems:   Pertinent items are noted in HPI Denies abnormal vaginal discharge w/ itching/odor/irritation, headaches, visual changes, shortness of breath, chest pain, abdominal pain, severe nausea/vomiting, or problems with urination or bowel movements unless otherwise stated above. Pertinent History Reviewed:  Reviewed past medical,surgical, social, obstetrical and family history.  Reviewed problem list, medications and allergies. Physical Assessment:   Vitals:   04/06/22 1438  BP: 120/71  Pulse: 85  Weight: 179 lb 12.8 oz (81.6 kg)  Body mass index is 30.86 kg/m.        Physical Examination:   General appearance: Well appearing, and in no distress  Mental status: Alert, oriented to person, place, and time  Skin: Warm & dry  Cardiovascular: Normal heart rate noted  Respiratory: Normal respiratory effort, no distress  Abdomen: Soft, gravid, nontender  Pelvic: Cervical exam performed  Dilation: 1 Effacement (%): Thick Station: Ballotable  Extremities: Edema: Trace  Fetal Status: Fetal Heart Rate (bpm): 152 Fundal Height: 40 cm Movement: Present Presentation: Vertex  No results found for this or any previous visit (from the past 24 hour(s)).  Assessment & Plan:  1) Low-risk pregnancy [id number] at [redacted]w[redacted]d with an Estimated Date of Delivery: 04/08/22   2) Supervision of other normal pregnancy, antepartum - Information provided on s/sx's of labor   2. GBS (group B Streptococcus carrier), +RV culture,  currently pregnant - IP abx tx  3. [redacted] weeks gestation of pregnancy    Meds: No orders of the defined types were placed in this encounter.  Labs/procedures today: cervical exam  Plan:  Continue routine obstetrical care   Reviewed: Term labor symptoms and general obstetric precautions including but not limited to vaginal bleeding, contractions, leaking of fluid and fetal movement were reviewed in detail with the patient.  All questions were answered. Has home bp cuff. Check bp weekly, let us know if >140/90.   Follow-up: Return in about 1 week (around 04/13/2022) for Return OB visit.  No orders of the defined types were placed in this encounter.  Laury Deep MSN, CNM 04/06/2022 2:57 PM

## 2022-04-08 ENCOUNTER — Inpatient Hospital Stay (HOSPITAL_COMMUNITY): Admit: 2022-04-08 | Payer: Self-pay

## 2022-04-09 ENCOUNTER — Encounter (HOSPITAL_COMMUNITY): Payer: Self-pay | Admitting: Obstetrics and Gynecology

## 2022-04-09 ENCOUNTER — Inpatient Hospital Stay (HOSPITAL_COMMUNITY)
Admission: AD | Admit: 2022-04-09 | Discharge: 2022-04-11 | DRG: 768 | Disposition: A | Discharge status: Home / Self Care | Payer: Medicaid Other | Attending: Obstetrics and Gynecology | Admitting: Obstetrics and Gynecology

## 2022-04-09 ENCOUNTER — Other Ambulatory Visit: Payer: Self-pay

## 2022-04-09 DIAGNOSIS — Z7982 Long term (current) use of aspirin: Secondary | ICD-10-CM | POA: Diagnosis not present

## 2022-04-09 DIAGNOSIS — O99824 Streptococcus B carrier state complicating childbirth: Secondary | ICD-10-CM | POA: Diagnosis present

## 2022-04-09 DIAGNOSIS — Z148 Genetic carrier of other disease: Secondary | ICD-10-CM | POA: Diagnosis not present

## 2022-04-09 DIAGNOSIS — O480 Post-term pregnancy: Principal | ICD-10-CM | POA: Diagnosis present

## 2022-04-09 DIAGNOSIS — Z302 Encounter for sterilization: Secondary | ICD-10-CM

## 2022-04-09 DIAGNOSIS — Z3A40 40 weeks gestation of pregnancy: Secondary | ICD-10-CM | POA: Diagnosis not present

## 2022-04-09 DIAGNOSIS — O43123 Velamentous insertion of umbilical cord, third trimester: Secondary | ICD-10-CM | POA: Diagnosis present

## 2022-04-09 DIAGNOSIS — Z3480 Encounter for supervision of other normal pregnancy, unspecified trimester: Principal | ICD-10-CM

## 2022-04-09 DIAGNOSIS — O43199 Other malformation of placenta, unspecified trimester: Secondary | ICD-10-CM

## 2022-04-09 LAB — CBC
HCT: 32 % — ABNORMAL LOW (ref 36.0–46.0)
Hemoglobin: 10.3 g/dL — ABNORMAL LOW (ref 12.0–15.0)
MCH: 25.2 pg — ABNORMAL LOW (ref 26.0–34.0)
MCHC: 32.2 g/dL (ref 30.0–36.0)
MCV: 78.4 fL — ABNORMAL LOW (ref 80.0–100.0)
Platelets: 284 10*3/uL (ref 150–400)
RBC: 4.08 MIL/uL (ref 3.87–5.11)
RDW: 15 % (ref 11.5–15.5)
WBC: 7.8 10*3/uL (ref 4.0–10.5)
nRBC: 0.3 % — ABNORMAL HIGH (ref 0.0–0.2)

## 2022-04-09 LAB — TYPE AND SCREEN
ABO/RH(D): A POS
Antibody Screen: NEGATIVE

## 2022-04-09 MED ORDER — OXYTOCIN-SODIUM CHLORIDE 30-0.9 UT/500ML-% IV SOLN
2.5000 [IU]/h | INTRAVENOUS | Status: DC
  Administered 2022-04-09: 2.5 [IU]/h via INTRAVENOUS

## 2022-04-09 MED ORDER — FENTANYL CITRATE (PF) 100 MCG/2ML IJ SOLN
INTRAMUSCULAR | Status: AC
  Filled 2022-04-09: qty 2

## 2022-04-09 MED ORDER — LACTATED RINGERS IV SOLN: 500.0000 mL | INTRAVENOUS | Status: DC | PRN

## 2022-04-09 MED ORDER — OXYCODONE-ACETAMINOPHEN 5-325 MG PO TABS: 1.0000 | ORAL_TABLET | ORAL | Status: DC | PRN

## 2022-04-09 MED ORDER — OXYTOCIN-SODIUM CHLORIDE 30-0.9 UT/500ML-% IV SOLN
INTRAVENOUS | Status: AC
  Filled 2022-04-09: qty 500

## 2022-04-09 MED ORDER — DIBUCAINE (PERIANAL) 1 % EX OINT: 1.0000 | TOPICAL_OINTMENT | CUTANEOUS | Status: DC | PRN

## 2022-04-09 MED ORDER — FENTANYL CITRATE (PF) 100 MCG/2ML IJ SOLN: 100.0000 ug | Freq: Once | INTRAMUSCULAR | Status: DC

## 2022-04-09 MED ORDER — TETANUS-DIPHTH-ACELL PERTUSSIS 5-2.5-18.5 LF-MCG/0.5 IM SUSY: 0.5000 mL | PREFILLED_SYRINGE | Freq: Once | INTRAMUSCULAR | Status: DC

## 2022-04-09 MED ORDER — METHYLERGONOVINE MALEATE 0.2 MG/ML IJ SOLN
INTRAMUSCULAR | Status: AC
  Administered 2022-04-09: 0.2 mg
  Filled 2022-04-09: qty 1

## 2022-04-09 MED ORDER — SODIUM CHLORIDE 0.9 % IV SOLN
2.0000 g | Freq: Once | INTRAVENOUS | Status: AC
  Administered 2022-04-09: 2 g via INTRAVENOUS
  Filled 2022-04-09: qty 2000

## 2022-04-09 MED ORDER — IBUPROFEN 600 MG PO TABS
600.0000 mg | ORAL_TABLET | Freq: Four times a day (QID) | ORAL | Status: DC
  Administered 2022-04-09 – 2022-04-11 (×5): 600 mg via ORAL
  Filled 2022-04-09 (×5): qty 1

## 2022-04-09 MED ORDER — SOD CITRATE-CITRIC ACID 500-334 MG/5ML PO SOLN: 30.0000 mL | ORAL | Status: DC | PRN

## 2022-04-09 MED ORDER — ACETAMINOPHEN 325 MG PO TABS: 650.0000 mg | ORAL_TABLET | ORAL | Status: DC | PRN

## 2022-04-09 MED ORDER — ZOLPIDEM TARTRATE 5 MG PO TABS: 5.0000 mg | ORAL_TABLET | Freq: Every evening | ORAL | Status: DC | PRN

## 2022-04-09 MED ORDER — FENTANYL CITRATE (PF) 100 MCG/2ML IJ SOLN
100.0000 ug | Freq: Once | INTRAMUSCULAR | Status: AC
  Administered 2022-04-09 (×2): 100 ug via INTRAVENOUS

## 2022-04-09 MED ORDER — PRENATAL MULTIVITAMIN CH
1.0000 | ORAL_TABLET | Freq: Every day | ORAL | Status: DC
  Administered 2022-04-10 – 2022-04-11 (×2): 1 via ORAL
  Filled 2022-04-09 (×2): qty 1

## 2022-04-09 MED ORDER — FLEET ENEMA 7-19 GM/118ML RE ENEM: 1.0000 | ENEMA | RECTAL | Status: DC | PRN

## 2022-04-09 MED ORDER — BENZOCAINE-MENTHOL 20-0.5 % EX AERO: 1.0000 | INHALATION_SPRAY | CUTANEOUS | Status: DC | PRN

## 2022-04-09 MED ORDER — DIPHENHYDRAMINE HCL 25 MG PO CAPS: 25.0000 mg | ORAL_CAPSULE | Freq: Four times a day (QID) | ORAL | Status: DC | PRN

## 2022-04-09 MED ORDER — OXYTOCIN BOLUS FROM INFUSION
333.0000 mL | Freq: Once | INTRAVENOUS | Status: AC
  Administered 2022-04-09: 333 mL via INTRAVENOUS

## 2022-04-09 MED ORDER — TRANEXAMIC ACID-NACL 1000-0.7 MG/100ML-% IV SOLN
INTRAVENOUS | Status: AC
  Administered 2022-04-09: 1000 mg
  Filled 2022-04-09: qty 100

## 2022-04-09 MED ORDER — WITCH HAZEL-GLYCERIN EX PADS: 1.0000 | MEDICATED_PAD | CUTANEOUS | Status: DC | PRN

## 2022-04-09 MED ORDER — SENNOSIDES-DOCUSATE SODIUM 8.6-50 MG PO TABS
2.0000 | ORAL_TABLET | Freq: Every day | ORAL | Status: DC
  Administered 2022-04-10 – 2022-04-11 (×2): 2 via ORAL
  Filled 2022-04-09 (×3): qty 2

## 2022-04-09 MED ORDER — LACTATED RINGERS IV SOLN: INTRAVENOUS | Status: DC

## 2022-04-09 MED ORDER — LIDOCAINE HCL (PF) 1 % IJ SOLN: 30.0000 mL | INTRAMUSCULAR | Status: DC | PRN

## 2022-04-09 MED ORDER — SIMETHICONE 80 MG PO CHEW
80.0000 mg | CHEWABLE_TABLET | ORAL | Status: DC | PRN
  Administered 2022-04-11: 80 mg via ORAL
  Filled 2022-04-09: qty 1

## 2022-04-09 MED ORDER — OXYCODONE-ACETAMINOPHEN 5-325 MG PO TABS
2.0000 | ORAL_TABLET | ORAL | Status: DC | PRN
  Administered 2022-04-09: 2 via ORAL
  Filled 2022-04-09: qty 2

## 2022-04-09 MED ORDER — SODIUM CHLORIDE 0.9 % IV SOLN: 1.0000 g | INTRAVENOUS | Status: DC

## 2022-04-09 MED ORDER — COCONUT OIL OIL: 1.0000 | TOPICAL_OIL | Status: DC | PRN

## 2022-04-09 MED ORDER — ONDANSETRON HCL 4 MG/2ML IJ SOLN: 4.0000 mg | INTRAMUSCULAR | Status: DC | PRN

## 2022-04-09 MED ORDER — ONDANSETRON HCL 4 MG PO TABS: 4.0000 mg | ORAL_TABLET | ORAL | Status: DC | PRN

## 2022-04-09 MED ORDER — ONDANSETRON HCL 4 MG/2ML IJ SOLN: 4.0000 mg | Freq: Four times a day (QID) | INTRAMUSCULAR | Status: DC | PRN

## 2022-04-09 MED ORDER — CEFAZOLIN SODIUM-DEXTROSE 2-4 GM/100ML-% IV SOLN
2.0000 g | Freq: Once | INTRAVENOUS | Status: AC
  Administered 2022-04-09: 2 g via INTRAVENOUS
  Filled 2022-04-09: qty 100

## 2022-04-09 NOTE — MAU Note (Signed)
Haley Potts is a 26 y.o. at [redacted]w[redacted]d here in MAU reporting: contractions since 1200pm today that are progressively getting stronger and more frequent. Patient denies LOF or vaginal bleeding, patient feels fetal movement. Onset of complaint: 1200pm today Pain score: 8 Vitals:   04/09/22 1537 04/09/22 1538  BP:  122/80  Pulse:  (!) 108  Resp:  18  Temp: 98.2 F (36.8 C)   SpO2:  100%     FHT:150

## 2022-04-09 NOTE — H&P (Signed)
OBSTETRIC ADMISSION HISTORY AND PHYSICAL  Haley Potts is a 26 y.o. female [phone] with IUP at [redacted]w[redacted]d by LMP presenting for SOL. She reports +FMs, No LOF, no VB, no blurry vision, headaches or peripheral edema, and RUQ pain.  She plans on breast and formula feeding. She request BTL for birth control. She received her prenatal care at  [place]: By LMP --->  Estimated Date of Delivery: 04/08/22  Sono:    '@[redacted]w[redacted]d'$ , CWD, normal anatomy, cephalic presentation, anterior placenta, 2716 g, 30% EFW   Prenatal History/Complications: GBS positive   Past Medical History: Past Medical History:  Diagnosis Date   Anemia    Depression    Post partum   * lasted 4 months, with first preg   Ovarian cyst    Preterm labor     Past Surgical History: Past Surgical History:  Procedure Laterality Date   INDUCED ABORTION     WISDOM TOOTH EXTRACTION      Obstetrical History: OB History     Gravida  7   Para  3   Term  2   Preterm  1   AB  3   Living  4      SAB  1   IAB  2   Ectopic  0   Multiple  1   Live Births  4           Social History Social History   Socioeconomic History   Marital status: Single    Spouse name: Not on file   Number of children: Not on file   Years of education: Not on file   Highest education level: Not on file  Occupational History   Not on file  Tobacco Use   Smoking status: Never   Smokeless tobacco: Never  Vaping Use   Vaping Use: Never used  Substance and Sexual Activity   Alcohol use: Not Currently   Drug use: No   Sexual activity: Not Currently    Partners: Male    Birth control/protection: None  Other Topics Concern   Not on file  Social History Narrative   Not on file   Social Determinants of Health   Financial Resource Strain: Not on file  Food Insecurity: No Food Insecurity (12/07/2018)   Hunger Vital Sign    Worried About Running Out of Food in the Last Year: Never true    Ran Out of Food in the Last  Year: Never true  Transportation Needs: No Transportation Needs (12/07/2018)   PRAPARE - Hydrologist (Medical): No    Lack of Transportation (Non-Medical): No  Physical Activity: Not on file  Stress: Not on file  Social Connections: Not on file    Family History: Family History  Problem Relation Age of Onset   Healthy Mother    Healthy Father    Cancer Paternal Grandmother    Asthma Neg Hx    Diabetes Neg Hx    Heart disease Neg Hx    Hypertension Neg Hx    Stroke Neg Hx     Allergies: No Known Allergies  Medications Prior to Admission  Medication Sig Dispense Refill Last Dose   aspirin EC 81 MG tablet Take 1 tablet (81 mg total) by mouth daily. Swallow whole. 90 tablet 2 Past Week   Blood Pressure Monitoring (BLOOD PRESSURE KIT) DEVI 1 kit by Does not apply route once a week. 1 each 0    Prenatal MV-Min-Fe  Fum-FA-DHA (PRENATAL 1 PO) Take 1 tablet by mouth daily. gummy   More than a month     Review of Systems   All systems reviewed and negative except as stated in HPI  Blood pressure 122/80, pulse (!) 108, temperature 98.2 F (36.8 C), temperature source Oral, resp. rate 18, height 5' 4"  (1.626 m), weight 81.8 kg, last menstrual period 07/02/2021, SpO2 100 %, unknown if currently breastfeeding. General appearance: alert and moderate distress Lungs: Normal work of breathing  Heart: regular rate and rhythm Abdomen: soft, non-tender; gravid Extremities: Homans sign is negative, no sign of DVT Presentation: cephalic Fetal monitoringBaseline: 145 bpm, Variability: Good {> 6 bpm), Accelerations: Reactive, and Decelerations: Absent Uterine activityFrequency: Every 2-3 minutes Dilation: 5.5 Effacement (%): 80 Station: -2 Exam by:: Jerelyn Scott RN   Prenatal labs: ABO, Rh: A/Positive/-- (03/29 1124) Antibody: Negative (03/29 1124) Rubella: 12.00 (03/29 1124) RPR: Non Reactive (07/27 1010)  HBsAg: Negative (03/29 1124)  HIV: Non Reactive  (07/27 1010)  GBS: Positive/-- (09/13 1418)  1 hr Glucola Neg  Genetic screening  LR female  Anatomy US Marginal cord insertion, otherwise noraml   Prenatal Transfer Tool  Maternal Diabetes: No Genetic Screening: Normal Maternal Ultrasounds/Referrals: Normal Fetal Ultrasounds or other Referrals:  Referred to Materal Fetal Medicine  Maternal Substance Abuse:  No Significant Maternal Medications:  None Significant Maternal Lab Results:  Group B Strep positive Number of Prenatal Visits:greater than 3 verified prenatal visits Other Comments:  None  No results found for this or any previous visit (from the past 24 hour(s)).  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 [id number] at 20w1dhere for SOL.   #Labor:Progressing spontaneously. Will need GBS prophylaxis. No need for augmentation at this time. Would like water birth. Will check if it is possible but tub may not be available.  #Pain: Per patients request  #FWB: Cat 1  #ID:  GBS positive, PCN  #MOF: Breast and formula #MOC: BTL, papers signed 01/27/22  JConcepcion Living MD  04/09/2022, 3:53 PM

## 2022-04-09 NOTE — Discharge Summary (Signed)
Postpartum Discharge Summary  Date of Service updated***     Patient Name: Haley Potts DOB: 1996-03-12 MRN: [id number]  Date of admission: 04/09/2022 Delivery date:04/09/2022  Delivering provider: [facility]  Date of discharge: 04/09/2022  Admitting diagnosis: Normal labor and delivery [O80] Intrauterine pregnancy: [redacted]w[redacted]d    Secondary diagnosis:  Principal Problem:   Normal labor and delivery  Additional problems: ***    Discharge diagnosis: Term Pregnancy Delivered                                              Post partum procedures: planning for BTL Augmentation: AROM Complications: None  Hospital course: Onset of Labor With Vaginal Delivery      26y.o. yo [id number] 453w1das admitted in Active Labor on 04/09/2022. She was admitted at 5-6 cm and progressed quickly to complete. A bulging bag was noted at the introitus so an amniotomy was performed; she delivered over an intact perineum after that by Dr. CrCaron Presume Membrane Rupture Time/Date: 5:15 PM ,04/09/2022   Delivery Method:Vaginal, Spontaneous  Episiotomy: None  Lacerations:  None  Patient had a postpartum course complicated by postpartum hemorrhage, with methergine, TXA and jada placement.  She is ambulating, tolerating a regular diet, passing flatus, and urinating well. Patient is discharged home in stable condition on 04/09/22.  Newborn Data: Birth date:04/09/2022  Birth time:5:16 PM  Gender:Female  Living status:Living  Apgars:8 ,9  Weight:3560 g   Magnesium Sulfate received: No BMZ received: No Rhophylac:No MMR:N/A T-DaP: declined in pregnancy Flu: declined in pregnancy Transfusion:{Transfusion received:30440034}  Physical exam  Vitals:   04/09/22 1537 04/09/22 1538 04/09/22 1607 04/09/22 1745  BP:  122/80 120/72 123/70  Pulse:  (!) 108 92 91  Resp:  18 18 16   Temp: 98.2 F (36.8 C)     TempSrc: Oral     SpO2:  100%    Weight:      Height:       General: {Exam;  general:21111117} Lochia: {Desc; appropriate/inappropriate:30686::"appropriate"} Uterine Fundus: {Desc; firm/soft:30687} Incision: {Exam; incision:21111123} DVT Evaluation: {Exam; dvt:2111122} Labs: Lab Results  Component Value Date   WBC 7.8 04/09/2022   HGB 10.3 (L) 04/09/2022   HCT 32.0 (L) 04/09/2022   MCV 78.4 (L) 04/09/2022   PLT 284 04/09/2022      Latest Ref Rng & Units 03/23/2018   12:28 AM  CMP  Glucose 70 - 99 mg/dL 84   BUN 6 - 20 mg/dL 12   Creatinine 0.44 - 1.00 mg/dL 0.74   Sodium 135 - 145 mmol/L 141   Potassium 3.5 - 5.1 mmol/L 3.6   Chloride 98 - 111 mmol/L 107   CO2 22 - 32 mmol/L 27   Calcium 8.9 - 10.3 mg/dL 9.1   Total Protein 6.5 - 8.1 g/dL 8.0   Total Bilirubin 0.3 - 1.2 mg/dL 0.6   Alkaline Phos 38 - 126 U/L 57   AST 15 - 41 U/L 19   ALT 0 - 44 U/L 14    Edinburgh Score:    06/01/2020    1:47 PM  Edinburgh Postnatal Depression Scale Screening Tool  I have been able to laugh and see the funny side of things. 0  I have looked forward with enjoyment to things. 0  I have blamed myself unnecessarily when things went wrong. 0  I have  been anxious or worried for no good reason. 2  I have felt scared or panicky for no good reason. 2  Things have been getting on top of me. 0  I have been so unhappy that I have had difficulty sleeping. 2  I have felt sad or miserable. 0  I have been so unhappy that I have been crying. 0  The thought of harming myself has occurred to me. 0  Edinburgh Postnatal Depression Scale Total 6     After visit meds:  Allergies as of 04/09/2022   No Known Allergies   Med Rec must be completed prior to using this [facility]***        Discharge home in stable condition Infant Feeding: {Baby feeding:23562} Infant Disposition:{CHL IP OB HOME WITH BTCYEL:85909} Discharge instruction: per After Visit Summary and Postpartum booklet. Activity: Advance as tolerated. Pelvic rest for 6 weeks.  Diet: {OB PJPE:16244695} Future  Appointments: Future Appointments  Date Time Provider [place]  04/11/2022  2:30 PM Shelda Pal, DO [place] None   Follow up Visit:  Sent by KK  Please schedule this patient for a In person postpartum visit in 6 weeks with the following provider: Any provider. Additional Postpartum F/U: nothing   Low risk pregnancy complicated by:  nothing Delivery mode:  Vaginal, Spontaneous  Anticipated Birth Control:   planning for PP tubal ligation   04/09/2022 Starr Lake, CNM

## 2022-04-09 NOTE — Progress Notes (Signed)
Called to patient's room regarding continued bleeding.  On evaluation uterine fundus was firm but lower uterine segment continues to be boggy.  Lower uterine sleep completed.  An additional 391 cc of blood delivered.  TXA given in [place] placed.  She will remain in to suction for 1 hour.  Can remove suction, deflate balloon, and monitor for 30 minutes and if no continued bleeding after 30 minutes may remove JADA.

## 2022-04-09 NOTE — Progress Notes (Signed)
8:51 PM - reassessed patient. Bleeding has slowed down. Was able to void on her own in a bed pan about an hour ago, and again just now. Total of ~175cc   Some bleeding noted in the Jada tube, but none in the canister. Pad has very minimal bleeding. We will go ahead and deflate the cervical seal, RN to take out Corey Skains if bleeding remains well controlled after watching for 30 mins.   9:58 PM - Recheck on [place]. She is doing well. Corey Skains has been taken out. Check on her pad, very mininmal bleeding noted. Fundus firm and about 2cm below umbilicus.   Ass: well controlled post partum bleeding. Plan for CBC am.  Transfer to post partum to continue recovery.  Liliane Channel MD MPH OB Fellow, [place] for [place] 04/09/2022

## 2022-04-10 ENCOUNTER — Other Ambulatory Visit: Payer: Self-pay

## 2022-04-10 ENCOUNTER — Inpatient Hospital Stay (HOSPITAL_COMMUNITY): Payer: Medicaid Other | Admitting: Anesthesiology

## 2022-04-10 ENCOUNTER — Encounter (HOSPITAL_COMMUNITY): Payer: Self-pay | Admitting: Obstetrics and Gynecology

## 2022-04-10 ENCOUNTER — Encounter (HOSPITAL_COMMUNITY)
Admission: AD | Disposition: A | Discharge status: Home / Self Care | Payer: Self-pay | Source: Home / Self Care | Attending: Obstetrics and Gynecology

## 2022-04-10 DIAGNOSIS — Z302 Encounter for sterilization: Secondary | ICD-10-CM | POA: Diagnosis not present

## 2022-04-10 HISTORY — PX: TUBAL LIGATION: SHX77

## 2022-04-10 LAB — CBC
HCT: 33.6 % — ABNORMAL LOW (ref 36.0–46.0)
Hemoglobin: 10.5 g/dL — ABNORMAL LOW (ref 12.0–15.0)
MCH: 24.9 pg — ABNORMAL LOW (ref 26.0–34.0)
MCHC: 31.3 g/dL (ref 30.0–36.0)
MCV: 79.6 fL — ABNORMAL LOW (ref 80.0–100.0)
Platelets: 262 10*3/uL (ref 150–400)
RBC: 4.22 MIL/uL (ref 3.87–5.11)
RDW: 15.1 % (ref 11.5–15.5)
WBC: 12.3 10*3/uL — ABNORMAL HIGH (ref 4.0–10.5)
nRBC: 0.2 % (ref 0.0–0.2)

## 2022-04-10 LAB — RPR: RPR Ser Ql: NONREACTIVE

## 2022-04-10 SURGERY — LIGATION, FALLOPIAN TUBE, POSTPARTUM
Anesthesia: Spinal | Laterality: Bilateral

## 2022-04-10 MED ORDER — LACTATED RINGERS IV SOLN: INTRAVENOUS | Status: DC | PRN

## 2022-04-10 MED ORDER — OXYCODONE HCL 5 MG/5ML PO SOLN: 5.0000 mg | Freq: Once | ORAL | Status: DC | PRN

## 2022-04-10 MED ORDER — OXYCODONE HCL 5 MG PO TABS: 5.0000 mg | ORAL_TABLET | Freq: Once | ORAL | Status: DC | PRN

## 2022-04-10 MED ORDER — KETOROLAC TROMETHAMINE 30 MG/ML IJ SOLN
INTRAMUSCULAR | Status: DC | PRN
  Administered 2022-04-10: 15 mg via INTRAVENOUS

## 2022-04-10 MED ORDER — DEXMEDETOMIDINE HCL IN NACL 80 MCG/20ML IV SOLN
INTRAVENOUS | Status: DC | PRN
  Administered 2022-04-10: 8 ug via BUCCAL
  Administered 2022-04-10 (×2): 4 ug via BUCCAL

## 2022-04-10 MED ORDER — OXYCODONE-ACETAMINOPHEN 5-325 MG PO TABS
1.0000 | ORAL_TABLET | Freq: Four times a day (QID) | ORAL | Status: DC | PRN
  Administered 2022-04-10 – 2022-04-11 (×4): 1 via ORAL
  Filled 2022-04-10 (×4): qty 1

## 2022-04-10 MED ORDER — FENTANYL CITRATE (PF) 100 MCG/2ML IJ SOLN
INTRAMUSCULAR | Status: AC
  Filled 2022-04-10: qty 2

## 2022-04-10 MED ORDER — MIDAZOLAM HCL 5 MG/5ML IJ SOLN
INTRAMUSCULAR | Status: DC | PRN
  Administered 2022-04-10: 2 mg via INTRAVENOUS

## 2022-04-10 MED ORDER — METOCLOPRAMIDE HCL 10 MG PO TABS
10.0000 mg | ORAL_TABLET | Freq: Once | ORAL | Status: AC
  Administered 2022-04-10: 10 mg via ORAL
  Filled 2022-04-10: qty 1

## 2022-04-10 MED ORDER — SODIUM CHLORIDE 0.9 % IR SOLN
Status: DC | PRN
  Administered 2022-04-10: 1000 mL

## 2022-04-10 MED ORDER — ONDANSETRON HCL 4 MG/2ML IJ SOLN
INTRAMUSCULAR | Status: DC | PRN
  Administered 2022-04-10: 4 mg via INTRAVENOUS

## 2022-04-10 MED ORDER — HYDROMORPHONE HCL 1 MG/ML IJ SOLN: 0.2500 mg | INTRAMUSCULAR | Status: DC | PRN

## 2022-04-10 MED ORDER — FENTANYL CITRATE (PF) 100 MCG/2ML IJ SOLN
INTRAMUSCULAR | Status: DC | PRN
  Administered 2022-04-10 (×2): 50 ug via INTRAVENOUS

## 2022-04-10 MED ORDER — MIDAZOLAM HCL 2 MG/2ML IJ SOLN
INTRAMUSCULAR | Status: AC
  Filled 2022-04-10: qty 2

## 2022-04-10 MED ORDER — FAMOTIDINE 20 MG PO TABS
40.0000 mg | ORAL_TABLET | Freq: Once | ORAL | Status: AC
  Administered 2022-04-10: 40 mg via ORAL
  Filled 2022-04-10: qty 2

## 2022-04-10 MED ORDER — BUPIVACAINE HCL (PF) 0.25 % IJ SOLN
10.0000 mL | Freq: Once | INTRAMUSCULAR | Status: DC
  Filled 2022-04-10 (×2): qty 10

## 2022-04-10 MED ORDER — ONDANSETRON HCL 4 MG/2ML IJ SOLN
INTRAMUSCULAR | Status: AC
  Filled 2022-04-10: qty 2

## 2022-04-10 MED ORDER — LIDOCAINE HCL 1 % IJ SOLN
INTRAMUSCULAR | Status: AC
  Filled 2022-04-10: qty 20

## 2022-04-10 MED ORDER — LIDOCAINE HCL (PF) 1 % IJ SOLN
INTRAMUSCULAR | Status: DC | PRN
  Administered 2022-04-10: 20 mL

## 2022-04-10 MED ORDER — KETOROLAC TROMETHAMINE 30 MG/ML IJ SOLN
INTRAMUSCULAR | Status: AC
  Filled 2022-04-10: qty 1

## 2022-04-10 MED ORDER — LACTATED RINGERS IV SOLN: INTRAVENOUS | Status: DC

## 2022-04-10 MED ORDER — KETOROLAC TROMETHAMINE 30 MG/ML IJ SOLN: 30.0000 mg | Freq: Once | INTRAMUSCULAR | Status: DC | PRN

## 2022-04-10 MED ORDER — SOD CITRATE-CITRIC ACID 500-334 MG/5ML PO SOLN
30.0000 mL | ORAL | Status: DC
  Filled 2022-04-10: qty 30

## 2022-04-10 MED ORDER — BUPIVACAINE IN DEXTROSE 0.75-8.25 % IT SOLN
INTRATHECAL | Status: DC | PRN
  Administered 2022-04-10: 10.5 mg via INTRATHECAL

## 2022-04-10 MED ORDER — ONDANSETRON HCL 4 MG/2ML IJ SOLN: 4.0000 mg | Freq: Once | INTRAMUSCULAR | Status: DC | PRN

## 2022-04-10 MED ORDER — STERILE WATER FOR IRRIGATION IR SOLN
Status: DC | PRN
  Administered 2022-04-10: 1000 mL

## 2022-04-10 SURGICAL SUPPLY — 27 items
ADH SKN CLS APL DERMABOND .7 (GAUZE/BANDAGES/DRESSINGS) ×1
CLOTH BEACON ORANGE TIMEOUT ST (SAFETY) ×2 IMPLANT
DERMABOND ADVANCED .7 DNX12 (GAUZE/BANDAGES/DRESSINGS) ×2 IMPLANT
DRSG OPSITE POSTOP 3X4 (GAUZE/BANDAGES/DRESSINGS) ×2 IMPLANT
DURAPREP 26ML APPLICATOR (WOUND CARE) ×2 IMPLANT
ELECT REM PT RETURN 9FT ADLT (ELECTROSURGICAL) ×1
ELECTRODE REM PT RTRN 9FT ADLT (ELECTROSURGICAL) ×2 IMPLANT
GLOVE BIO SURGEON STRL SZ7 (GLOVE) ×2 IMPLANT
GLOVE BIOGEL PI IND STRL 7.0 (GLOVE) ×2 IMPLANT
GLOVE BIOGEL PI IND STRL 7.5 (GLOVE) ×2 IMPLANT
GOWN STRL REUS W/TWL LRG LVL3 (GOWN DISPOSABLE) ×4 IMPLANT
NEEDLE HYPO 22GX1.5 SAFETY (NEEDLE) ×2 IMPLANT
NS IRRIG 1000ML POUR BTL (IV SOLUTION) ×2 IMPLANT
PACK ABDOMINAL MINOR (CUSTOM PROCEDURE TRAY) ×2 IMPLANT
PENCIL BUTTON HOLSTER BLD 10FT (ELECTRODE) IMPLANT
PENCIL SMOKE EVACUATOR (MISCELLANEOUS) IMPLANT
PROTECTOR NERVE ULNAR (MISCELLANEOUS) ×2 IMPLANT
SPONGE LAP 4X18 RFD (DISPOSABLE) ×2 IMPLANT
SUT MNCRL AB 3-0 PS2 27 (SUTURE) IMPLANT
SUT MNCRL AB 4-0 PS2 18 (SUTURE) ×2 IMPLANT
SUT PLAIN 2 0 (SUTURE)
SUT PLAIN ABS 2-0 CT1 27XMFL (SUTURE) IMPLANT
SUT VICRYL 0 TIES 12 18 (SUTURE) IMPLANT
SUT VICRYL 0 UR6 27IN ABS (SUTURE) ×2 IMPLANT
SYR CONTROL 10ML LL (SYRINGE) ×2 IMPLANT
TOWEL OR 17X24 6PK STRL BLUE (TOWEL DISPOSABLE) ×4 IMPLANT
TRAY FOLEY CATH SILVER 14FR (SET/KITS/TRAYS/PACK) ×2 IMPLANT

## 2022-04-10 NOTE — Lactation Note (Signed)
This note was copied from a baby's chart. Lactation Consultation Note  Patient Name: Haley Potts [id number] Date: 04/10/2022   Age:25 hours  LC attempted to visit with the birth parent. Per the support person, the birth parent was out getting a tubal. Lactation will follow-up later.   Maternal Data    Feeding Nipple Type: Slow - flow  LATCH Score                    Lactation Tools Discussed/Used    Interventions    Discharge    Consult Status      [place] 04/10/2022, 1:44 PM

## 2022-04-10 NOTE — Progress Notes (Signed)
Daily Post Partum Note  04/10/2022 Haley Potts is a 26 y.o. [id number] PPD#1 s/p  SVD/intact perineum @ 40w1.  Pregnancy c/b peripartum bleeding due to atony and precipitous labor with jada placement and uterotonics given on L&D 24hr/overnight events:  none  Subjective:  Meeting all PP goals. NPO since 0300  Objective:    Current Vital Signs 24h Vital Sign Ranges  T 98.1 F (36.7 C) Temp  Avg: 98.2 F (36.8 C)  Min: 98.1 F (36.7 C)  Max: 98.3 F (36.8 C)  BP 118/60 BP  Min: 95/61  Max: 133/77  HR 68 Pulse  Avg: 82.7  Min: 68  Max: 128  RR 17 Resp  Avg: 17.3  Min: 16  Max: 18  SaO2 100 %   SpO2  Avg: 99.5 %  Min: 99 %  Max: 100 %       24 Hour I/O Current Shift I/O  Time Ins Outs 10/07 0701 - 10/08 0700 In: 421.2 [I.V.:421.2] Out: 1606 [Urine:325] No intake/output data recorded.   General: NAD Abdomen: nttp. Firm fundus at the umbilicus. Perineum: deferred Skin:  Warm and dry.  Cardiovascular: S1, S2 normal, no murmur, rub or gallop, regular rate and rhythm Respiratory:  Clear to auscultation bilateral. Normal respiratory effort Extremities: no c/c/e  Medications Current Facility-Administered Medications  Medication Dose Route Frequency Provider Last Rate Last Admin   acetaminophen (TYLENOL) tablet 650 mg  650 mg Oral Q4H PRN Marylene Land, CNM       benzocaine-Menthol (DERMOPLAST) 20-0.5 % topical spray 1 Application  1 Application Topical PRN Marylene Land, CNM       coconut oil  1 Application Topical PRN Marylene Land, CNM       witch hazel-glycerin (TUCKS) pad 1 Application  1 Application Topical PRN Marylene Land, CNM       And   dibucaine (NUPERCAINAL) 1 % rectal ointment 1 Application  1 Application Rectal PRN Marylene Land, CNM       diphenhydrAMINE (BENADRYL) capsule 25 mg  25 mg Oral Q6H PRN Marylene Land, CNM       fentaNYL (SUBLIMAZE) injection 100 mcg  100 mcg Intravenous Once  Cortland Bing, MD       ibuprofen (ADVIL) tablet 600 mg  600 mg Oral Q6H Marylene Land, CNM   600 mg at 04/10/22 0542   lactated ringers infusion   Intravenous Continuous Garibaldi Bing, MD       ondansetron [facility]) tablet 4 mg  4 mg Oral Q4H PRN Marylene Land, CNM       Or   ondansetron [facility]) injection 4 mg  4 mg Intravenous Q4H PRN Marylene Land, CNM       prenatal multivitamin tablet 1 tablet  1 tablet Oral Q1200 Kooistra, Charlesetta Garibaldi, CNM       senna-docusate (Senokot-S) tablet 2 tablet  2 tablet Oral Daily Marylene Land, CNM       simethicone [facility]) chewable tablet 80 mg  80 mg Oral PRN Marylene Land, CNM       sodium citrate-citric acid (ORACIT) solution 30 mL  30 mL Oral On Call to OR Bells Bing, MD       Tdap Leda Min) injection 0.5 mL  0.5 mL Intramuscular Once Kooistra, Charlesetta Garibaldi, CNM       zolpidem (AMBIEN) tablet 5 mg  5 mg Oral QHS PRN Marylene Land, CNM        Labs:  Recent Labs  Lab 04/09/22 1547 04/10/22 0436  WBC 7.8 12.3*  HGB 10.3* 10.5*  HCT 32.0* 33.6*  PLT 284 262   Assessment & Plan:  Patient doing well *Postpartum/postop: routine care. Desires ppBTL; papers are UTD. I discussed with her re: BTL and surgical risks, especially permanency, need for additional procedures, etc and she would like to proceed. A POS. RPR pending. Girl. Plan for BTL later today.   Durene Romans MD Attending Center for [facility])

## 2022-04-10 NOTE — Anesthesia Procedure Notes (Signed)
Spinal  Patient location during procedure: OB Start time: 04/10/2022 12:52 PM End time: 04/10/2022 12:57 PM Reason for block: surgical anesthesia Staffing Performed: anesthesiologist  Anesthesiologist: Barnet Glasgow, MD Performed by: Barnet Glasgow, MD Authorized by: Barnet Glasgow, MD   Preanesthetic Checklist Completed: patient identified, IV checked, risks and benefits discussed, surgical consent, monitors and equipment checked, pre-op evaluation and timeout performed Spinal Block Patient position: sitting Prep: DuraPrep and site prepped and draped Patient monitoring: heart rate, cardiac monitor, continuous pulse ox and blood pressure Approach: midline Location: L3-4 Injection technique: single-shot Needle Needle type: Pencan  Needle gauge: 24 G Needle length: 10 cm Needle insertion depth: 5 cm Assessment Sensory level: T4 Events: CSF return Additional Notes 2  Attempt (s). Pt tolerated procedure well.

## 2022-04-10 NOTE — Transfer of Care (Signed)
Immediate Anesthesia Transfer of Care Note  Patient: Haley Potts  Procedure(s) Performed: POST PARTUM TUBAL LIGATION (Bilateral)  Patient Location: PACU  Anesthesia Type:Spinal  Level of Consciousness: awake and alert   Airway & Oxygen Therapy: Patient Spontanous Breathing  Post-op Assessment: Report given to RN  Post vital signs: Reviewed  Last Vitals:  Vitals Value Taken Time  BP 107/72 04/10/22 1415  Temp    Pulse 68 04/10/22 1415  Resp 22 04/10/22 1415  SpO2 98 % 04/10/22 1415  Vitals shown include unvalidated device data.  Last Pain:  Vitals:   04/10/22 1125  TempSrc:   PainSc: 0-No pain         Complications: No notable events documented.

## 2022-04-10 NOTE — Anesthesia Preprocedure Evaluation (Signed)
Anesthesia Evaluation  Patient identified by MRN, date of birth, ID band Patient awake    Reviewed: Allergy & Precautions, NPO status , Patient's Chart, lab work & pertinent test results  Airway Mallampati: II  TM Distance: >3 FB Neck ROM: Full    Dental no notable dental hx. (+) Teeth Intact, Dental Advisory Given   Pulmonary neg pulmonary ROS,    Pulmonary exam normal breath sounds clear to auscultation       Cardiovascular Exercise Tolerance: Good Normal cardiovascular exam Rhythm:Regular Rate:Normal     Neuro/Psych negative neurological ROS  negative psych ROS   GI/Hepatic negative GI ROS, Neg liver ROS,   Endo/Other  negative endocrine ROS  Renal/GU negative Renal ROS     Musculoskeletal negative musculoskeletal ROS (+)   Abdominal   Peds  Hematology  (+) Blood dyscrasia, anemia , Lab Results      Component                Value               Date                      WBC                      12.3 (H)            04/10/2022                HGB                      10.5 (L)            04/10/2022                HCT                      33.6 (L)            04/10/2022                MCV                      79.6 (L)            04/10/2022                PLT                      262                 04/10/2022              Anesthesia Other Findings NKA  Reproductive/Obstetrics                             Anesthesia Physical Anesthesia Plan  ASA: 2  Anesthesia Plan: Spinal   Post-op Pain Management: Regional block* and Toradol IV (intra-op)*   Induction:   PONV Risk Score and Plan: Treatment may vary due to age or medical condition  Airway Management Planned: Natural Airway and Nasal Cannula  Additional Equipment: None  Intra-op Plan:   Post-operative Plan:   Informed Consent: I have reviewed the patients History and Physical, chart, labs and discussed the procedure including  the risks, benefits and alternatives for the proposed anesthesia with the patient or authorized representative who has indicated his/her understanding and acceptance.  Plan Discussed with:   Anesthesia Plan Comments: (G7P5 for PPTL under spinal)        Anesthesia Quick Evaluation

## 2022-04-10 NOTE — Op Note (Signed)
Operative Note   04/10/2022  PRE-OP DIAGNOSIS: Desire for permanent sterilization.  Postpartum Day #1   POST-OP DIAGNOSIS: Same  SURGEON: Surgeon(s) and Role:    * Aletha Halim, MD - Primary  ASSISTANT: Gerlene Fee, MD (OB Fellow)  ANESTHESIA: spinal and local  PROCEDURE: mini-laparotomy, bilateral tubal ligation via [place] method  ESTIMATED BLOOD LOSS: 65mL  DRAINS: none  TOTAL IV FLUIDS: per anesthesia note  SPECIMENS:  Portions of bilateral tubes to pathology  VTE PROPHYLAXIS: SCDs to the bilateral lower extremities  ANTIBIOTICS: not indicated  COMPLICATIONS: none  DISPOSITION: PACU - hemodynamically stable.  CONDITION: stable  FINDINGS: No intra-abdominal adhesions noted. Smooth, normally contoured uterine fundus and bilateral tubes. Normal appearing tubes and normal ovaries on palpation. +lumens noted on each cross section  PROCEDURE IN DETAIL: The patient was taken to the OR where anesthesia was administed. The patient was positioned in dorsal supine. The patient was prepped and draped in the normal sterile fashion a 4cm horizontal incision was made approximately 2 finger breadths below the umbilicus, after injection with local anesthesia. The skin was then incised with the scalpel and the underlying tissue dissected with the bovie and the fascia nicked in the midline with the scalpel and then extended laterally sharply.  The abdomen was then entered bluntly and a moist lap sponge used to displace the bowel.   The left Fallopian tube was identified by tracing out to the fimbraie, grasped with the Babcock clamps. An avascular midsection of the tube approximately 3-4cm from the cornua was grasped with the babcock clamps and the distal and proximal aspects were ligated with a suture of 1-0 vicryl, with the intervening portion of tube was transected and removed, via the Metzenbaum scissors.  Attention was then turned to the right fallopian tube after confirmation by  tracing the tube out to the fimbriae. The same procedure was then performed on the right Fallopian tube, with excellent hemostasis was noted from both BTL sites.   The lap sponge was then removed from the abdomen and the fascia closed in running fashion with 0 vicryl and the subcutaneous tissue irrigated and closed with interrupted sutures of 3-0 plain gut. The skin was then closed with 3-0 monocryl.   The patient tolerated the procedure well. All counts were correct x 2. The patient was transferred to the recovery room awake, alert and breathing independently.   Durene Romans MD Attending Center for [employer])

## 2022-04-10 NOTE — Progress Notes (Signed)
CSW received consult for hx of Depression.  CSW met with MOB at bedside to offer support and complete assessment. When CSW entered room, FOB was present. CSW introduced self and requested to speak with MOB alone. MOB provided verbal consent to complete consult with FOB present. MOB presented as calm and remained engaged during encounter. CSW explained reason for consult and inquired how MOB has felt emotionally during pregnancy and since giving birth. MOB reports she feels "relieved" since giving birth and reports emotionally she felt "overall good" during pregnancy. MOB reports she experienced postpartum depression after all of her prior pregnancies, sharing that PPD was the most significant after her first child, marked by "unstable emotions." MOB reports her experiences of PPD after giving birth to her first child were all marked by a "difficult adjustment time" but reports her mood eventually leveled out. MOB reports she met with a therapist after giving birth to her first child who she met with 4-5 months until her postpartum depression symptoms resolved. MOB reports she was also prescribed psychotropic medication after giving birth to her first child but did not take the medication. MOB declined outpatient mental health resources at this time. MOB identified FOB and her family as supports. MOB denied SI/HI.  MOB reports she has all needed items for infant, including a car seat and bassinet. MOB has chosen [facility] as infant's pediatrician office. CSW placed a WIC referral with MOB's consent. MOB reports she receives food stamps and was encouraged to contact her case worker to add infant to benefits.   CSW provided education regarding the baby blues period vs. perinatal mood disorders, discussed treatment and offered resources for mental health follow up if concerns arise. MOB reports she feels comfortable speaking with her OBGYN about her mental health if concerns arise. CSW recommends  self-evaluation during the postpartum time period using the New Mom Checklist from Postpartum Progress and encouraged MOB to contact a medical professional if symptoms are noted at any time.    CSW provided review of Sudden Infant Death Syndrome (SIDS) precautions.    CSW identifies no further need for intervention and no barriers to discharge at this time.  Signed,  Ginger Leeth K. Jamesia Linnen, MSW, LCSWA, LCASA 04/10/2022 1:05 PM 

## 2022-04-11 ENCOUNTER — Other Ambulatory Visit (HOSPITAL_COMMUNITY): Payer: Self-pay

## 2022-04-11 ENCOUNTER — Encounter: Payer: Medicaid Other | Admitting: Family Medicine

## 2022-04-11 MED ORDER — BENZOCAINE-MENTHOL 20-0.5 % EX AERO
1.0000 | INHALATION_SPRAY | CUTANEOUS | 0 refills | Status: AC | PRN
  Filled 2022-04-11: qty 78, fill #0

## 2022-04-11 MED ORDER — ACETAMINOPHEN 325 MG PO TABS
650.0000 mg | ORAL_TABLET | ORAL | 0 refills | Status: AC | PRN
  Filled 2022-04-11: qty 30, 3d supply, fill #0

## 2022-04-11 MED ORDER — SENNOSIDES-DOCUSATE SODIUM 8.6-50 MG PO TABS
2.0000 | ORAL_TABLET | Freq: Every day | ORAL | 0 refills | Status: AC
  Filled 2022-04-11: qty 30, 15d supply, fill #0

## 2022-04-11 MED ORDER — IBUPROFEN 600 MG PO TABS
600.0000 mg | ORAL_TABLET | Freq: Four times a day (QID) | ORAL | 0 refills | Status: AC
  Filled 2022-04-11: qty 30, 8d supply, fill #0

## 2022-04-11 NOTE — Lactation Note (Signed)
This note was copied from a baby's chart. Lactation Consultation Note  Patient Name: Haley Potts Date: 04/11/2022 Reason for consult: Initial assessment;Term Age:25 hours Per Birth Parent infant has not been breastfeeding well not sustaining latch and Birth Parent has been supplementing infant with formula. LC ask Birth Parent to do reverse pressure soften to evert nipple shaft out more prior to latching infant at the breast, infant was on and off Birth Parent's right breast  for 12 minutes using the cradle hold position. Afterwards Birth Parent's choice infant was supplemented with 12 mls of formula using yellow slow flow bottle nipple. Birth Parent will continue to work towards latching infant at the breast. Birth Parent was given hand pump with 21 breast flange, Birth Parent knows to pre-pump breast prior to latching infant at the breast and continue to BF infant by cues, 8 to 12+ times within 24 hours, STS. See Birth Parent nipple type below in latch assessment. Birth Parent knows to continue to ask RN/LC for latch assistance if needed. Mom made aware of O/P services, breastfeeding support groups, community resources, and our phone # for post-discharge questions.   Maternal Data Has patient been taught Hand Expression?: Yes Does the patient have breastfeeding experience prior to this delivery?: Yes How long did the patient breastfeed?: Per Birth Parent BF 2nd child for 3 months, 3rd& 4th ( twins) 2 months and pumped 3 months,  Feeding Mother's Current Feeding Choice: Breast Milk and Formula  LATCH Score Latch: Repeated attempts needed to sustain latch, nipple held in mouth throughout feeding, stimulation needed to elicit sucking reflex.  Audible Swallowing: A few with stimulation  Type of Nipple: Flat  Comfort (Breast/Nipple): Soft / non-tender  Hold (Positioning): Assistance needed to correctly position infant at breast and maintain latch.  LATCH Score:  6   Lactation Tools Discussed/Used Tools: Pump Breast pump type: Manual Pump Education: Setup, frequency, and cleaning;Milk Storage Reason for Pumping: Help evert nippls shaft out more prior to latching infant at the breast ( flat nipples). Pumping frequency: Pre-pump prior to latching infant at the breast.  Interventions Interventions: Breast feeding basics reviewed;Assisted with latch;Skin to skin;Pre-pump if needed;Breast compression;Support pillows;Adjust position;Position options;Hand pump;Education;LC Services brochure  Discharge    Consult Status Consult Status: Follow-up Date: 04/11/22 Follow-up type: In-patient    Vicente Serene 04/11/2022, 3:35 AM

## 2022-04-11 NOTE — Lactation Note (Signed)
This note was copied from a baby's chart. Lactation Consultation Note  Patient Name: Haley Potts [id number] Date: 04/11/2022 Reason for consult: Follow-up assessment;Term Age:25 hours   P5: Term infant at 40+1 weeks Feeding preference: Breast/formula Weight loss: 3%  Baby was asleep in birth parent's arms when I arrived.  Birth parent has only breast fed once since delivery.  She had no questions/concerns and did not desire any lactation assistance.  Provided the supplementation guidelines and reviewed with birth parent.  Encouraged to feed 30+ mls/feeding, especially if baby is not breast feeding.  Birth parent verbalized understanding.  Support person present.   Maternal Data Has patient been taught Hand Expression?: Yes Does the patient have breastfeeding experience prior to this delivery?: Yes How long did the patient breastfeed?: 3 months with her second child and 2 months with her twins (third and fourth children)  Feeding Mother's Current Feeding Choice: Breast Milk and Formula  LATCH Score                    Lactation Tools Discussed/Used    Interventions    Discharge Pump: Manual WIC Program: Yes  Consult Status Consult Status: Follow-up Date: 04/12/22 Follow-up type: In-patient    Haley Potts 04/11/2022, 2:53 PM

## 2022-04-11 NOTE — Anesthesia Postprocedure Evaluation (Signed)
Anesthesia Post Note  Patient: Haley Potts  Procedure(s) Performed: POST PARTUM TUBAL LIGATION (Bilateral)     Patient location during evaluation: Mother Baby Anesthesia Type: Spinal Level of consciousness: oriented and awake and alert Pain management: pain level controlled Vital Signs Assessment: post-procedure vital signs reviewed and stable Respiratory status: spontaneous breathing and respiratory function stable Cardiovascular status: blood pressure returned to baseline and stable Postop Assessment: no headache, no backache, no apparent nausea or vomiting and able to ambulate Anesthetic complications: no   No notable events documented.  Last Vitals:  Vitals:   04/11/22 0254 04/11/22 0613  BP: 121/70 114/78  Pulse: 75 66  Resp: 16 16  Temp: 36.9 C 36.8 C  SpO2: 100% 99%    Last Pain:  Vitals:   04/11/22 1500  TempSrc:   PainSc: 0-No pain   Pain Goal: Patients Stated Pain Goal: 6 (04/10/22 1430)                 Barnet Glasgow

## 2022-04-11 NOTE — Progress Notes (Signed)
CSW followed up with MOB via telephone as RN reported that MOB requested to speak with CSW. CSW introduced self and inquired about MOB's needs. MOB reported that she needs a car seat and explained that her family did not provide one as they had agreed to. CSW explained the hospital car seat program to MOB, MOB reported that they were interested and able to pay the $30. CSW agreed to deliver the car seat tomorrow as they are not scheduled to discharge today, MOB agreed. CSW inquired about any additional needs, MOB reported that she missed her phone call with [place]. CSW agreed to provide contact information for [facility].   CSW provided contact information for [facility].  CSW will deliver car seat tomorrow.   Abundio Miu, [place] Worker [facility] Cell#: [phone]

## 2022-04-13 LAB — SURGICAL PATHOLOGY

## 2022-04-20 ENCOUNTER — Telehealth (HOSPITAL_COMMUNITY): Payer: Self-pay | Admitting: *Deleted

## 2022-04-20 NOTE — Telephone Encounter (Signed)
Attempted Hospital Discharge Follow-Up Call.  Left voice mail requesting that patient return RN's phone call if patient has any concerns or questions regarding herself or her baby.  

## 2022-04-23 ENCOUNTER — Other Ambulatory Visit (HOSPITAL_COMMUNITY): Payer: Self-pay

## 2022-05-24 ENCOUNTER — Ambulatory Visit (INDEPENDENT_AMBULATORY_CARE_PROVIDER_SITE_OTHER): Payer: Medicaid Other | Admitting: Advanced Practice Midwife

## 2022-05-24 ENCOUNTER — Encounter: Payer: Self-pay | Admitting: Advanced Practice Midwife

## 2022-05-24 DIAGNOSIS — Z9851 Tubal ligation status: Secondary | ICD-10-CM

## 2022-05-24 NOTE — Progress Notes (Signed)
Post Partum Visit Note  Haley Potts is a 26 y.o. [phone] female who presents for a postpartum visit. She is 6 weeks postpartum following a normal spontaneous vaginal delivery.  I have fully reviewed the prenatal and intrapartum course. The delivery was at 40.1 gestational weeks.  Anesthesia: none. Postpartum course has been going well. Baby is doing well. Baby is feeding by bottle - Similac Sensitive RS. Bleeding brown. Bowel function is normal. Bladder function is normal. Patient is not sexually active. Contraception method is tubal ligation. Postpartum depression screening: positive.   The pregnancy intention screening data noted above was reviewed. Potential methods of contraception were discussed. The patient elected to proceed with No data recorded.   Edinburgh Postnatal Depression Scale - 05/24/22 1623       Edinburgh Postnatal Depression Scale:  In the Past 7 Days   I have been able to laugh and see the funny side of things. 0    I have looked forward with enjoyment to things. 0    I have blamed myself unnecessarily when things went wrong. 0    I have been anxious or worried for no good reason. 0    I have felt scared or panicky for no good reason. 0    Things have been getting on top of me. 0    I have been so unhappy that I have had difficulty sleeping. 0    I have felt sad or miserable. 0    I have been so unhappy that I have been crying. 0    The thought of harming myself has occurred to me. 0    Edinburgh Postnatal Depression Scale Total 0             Health Maintenance Due  Topic Date Due   COVID-19 Vaccine (1) Never done   HPV VACCINES (1 - 2-dose series) Never done   INFLUENZA VACCINE  02/01/2022    The following portions of the patient's history were reviewed and updated as appropriate: allergies, current medications, past family history, past medical history, past social history, past surgical history, and problem list.  Review of Systems Pertinent  items noted in HPI and remainder of comprehensive ROS otherwise negative.  Objective:  BP 104/69   Pulse (!) 58   Ht 5\' 4"  (1.626 m)   Wt 162 lb 6.4 oz (73.7 kg)   LMP 05/23/2022 (Exact Date)   Breastfeeding No   BMI 27.88 kg/m    VS reviewed, nursing note reviewed,  Constitutional: well developed, well nourished, no distress HEENT: normocephalic CV: normal rate Pulm/chest wall: normal effort Abdomen: soft Neuro: alert and oriented x 3 Skin: warm, dry Psych: affect normal  Assessment:   1. Postpartum care following vaginal delivery --Doing well, bonding well with baby, good support at home  Plan:   Essential components of care per ACOG recommendations:  1.  Mood and well being: Patient with negative depression screening today. Reviewed local resources for support.  - Patient tobacco use? No.   - hx of drug use? No.    2. Infant care and feeding:  -Patient currently breastmilk feeding? No.  -Social determinants of health (SDOH) reviewed in EPIC. No concerns   3. Sexuality, contraception and birth spacing - Patient does not want a pregnancy in the next year.  Desired family size is 5 children.  - Reviewed reproductive life planning.  --BTL performed at the hospital   4. Sleep and fatigue -Encouraged family/partner/community support of 4 hrs  of uninterrupted sleep to help with mood and fatigue  5. Physical Recovery  - Discussed patients delivery and complications. She describes her labor as good. - Patient had a Vaginal, no problems at delivery. Patient had   no  laceration. Perineal healing reviewed. Patient expressed understanding - Patient has urinary incontinence? No. - Patient is safe to resume physical and sexual activity  6.  Health Maintenance - HM due items addressed Yes - Last pap smear  Diagnosis  Date Value Ref Range Status  09/29/2021   Final   - Negative for intraepithelial lesion or malignancy (NILM)   Pap smear not done at today's visit.   -Breast Cancer screening indicated? No.   7. Chronic Disease/Pregnancy Condition follow up: None  - PCP follow up  Sharen Counter, CNM [facility] [employer], [facility]

## 2022-09-13 DIAGNOSIS — N10 Acute tubulo-interstitial nephritis: Secondary | ICD-10-CM | POA: Diagnosis not present

## 2022-09-13 DIAGNOSIS — R102 Pelvic and perineal pain: Secondary | ICD-10-CM | POA: Diagnosis not present

## 2022-09-13 DIAGNOSIS — R300 Dysuria: Secondary | ICD-10-CM | POA: Diagnosis not present

## 2023-02-12 DIAGNOSIS — F331 Major depressive disorder, recurrent, moderate: Secondary | ICD-10-CM | POA: Diagnosis not present

## 2023-02-12 DIAGNOSIS — F4320 Adjustment disorder, unspecified: Secondary | ICD-10-CM | POA: Diagnosis not present

## 2023-02-19 DIAGNOSIS — F331 Major depressive disorder, recurrent, moderate: Secondary | ICD-10-CM | POA: Diagnosis not present

## 2023-02-19 DIAGNOSIS — F4322 Adjustment disorder with anxiety: Secondary | ICD-10-CM | POA: Diagnosis not present

## 2023-02-26 DIAGNOSIS — F4322 Adjustment disorder with anxiety: Secondary | ICD-10-CM | POA: Diagnosis not present

## 2023-02-26 DIAGNOSIS — F331 Major depressive disorder, recurrent, moderate: Secondary | ICD-10-CM | POA: Diagnosis not present

## 2023-03-05 DIAGNOSIS — F331 Major depressive disorder, recurrent, moderate: Secondary | ICD-10-CM | POA: Diagnosis not present

## 2023-03-05 DIAGNOSIS — F4322 Adjustment disorder with anxiety: Secondary | ICD-10-CM | POA: Diagnosis not present

## 2023-03-12 DIAGNOSIS — F331 Major depressive disorder, recurrent, moderate: Secondary | ICD-10-CM | POA: Diagnosis not present

## 2023-03-12 DIAGNOSIS — F4322 Adjustment disorder with anxiety: Secondary | ICD-10-CM | POA: Diagnosis not present

## 2023-03-19 DIAGNOSIS — F331 Major depressive disorder, recurrent, moderate: Secondary | ICD-10-CM | POA: Diagnosis not present

## 2023-03-19 DIAGNOSIS — F4322 Adjustment disorder with anxiety: Secondary | ICD-10-CM | POA: Diagnosis not present

## 2023-03-24 DIAGNOSIS — Z0000 Encounter for general adult medical examination without abnormal findings: Secondary | ICD-10-CM | POA: Diagnosis not present

## 2023-03-26 DIAGNOSIS — F331 Major depressive disorder, recurrent, moderate: Secondary | ICD-10-CM | POA: Diagnosis not present

## 2023-03-26 DIAGNOSIS — F4322 Adjustment disorder with anxiety: Secondary | ICD-10-CM | POA: Diagnosis not present

## 2023-04-02 DIAGNOSIS — F4322 Adjustment disorder with anxiety: Secondary | ICD-10-CM | POA: Diagnosis not present

## 2023-04-02 DIAGNOSIS — F331 Major depressive disorder, recurrent, moderate: Secondary | ICD-10-CM | POA: Diagnosis not present

## 2023-04-09 DIAGNOSIS — F331 Major depressive disorder, recurrent, moderate: Secondary | ICD-10-CM | POA: Diagnosis not present

## 2023-04-09 DIAGNOSIS — F4322 Adjustment disorder with anxiety: Secondary | ICD-10-CM | POA: Diagnosis not present

## 2023-04-16 DIAGNOSIS — F331 Major depressive disorder, recurrent, moderate: Secondary | ICD-10-CM | POA: Diagnosis not present

## 2023-04-16 DIAGNOSIS — F4322 Adjustment disorder with anxiety: Secondary | ICD-10-CM | POA: Diagnosis not present

## 2023-04-23 DIAGNOSIS — F331 Major depressive disorder, recurrent, moderate: Secondary | ICD-10-CM | POA: Diagnosis not present

## 2023-04-23 DIAGNOSIS — F4322 Adjustment disorder with anxiety: Secondary | ICD-10-CM | POA: Diagnosis not present

## 2023-04-30 DIAGNOSIS — F331 Major depressive disorder, recurrent, moderate: Secondary | ICD-10-CM | POA: Diagnosis not present

## 2023-04-30 DIAGNOSIS — F4322 Adjustment disorder with anxiety: Secondary | ICD-10-CM | POA: Diagnosis not present

## 2023-05-07 DIAGNOSIS — F4322 Adjustment disorder with anxiety: Secondary | ICD-10-CM | POA: Diagnosis not present

## 2023-05-07 DIAGNOSIS — F331 Major depressive disorder, recurrent, moderate: Secondary | ICD-10-CM | POA: Diagnosis not present

## 2023-05-14 DIAGNOSIS — F4322 Adjustment disorder with anxiety: Secondary | ICD-10-CM | POA: Diagnosis not present

## 2023-05-14 DIAGNOSIS — F331 Major depressive disorder, recurrent, moderate: Secondary | ICD-10-CM | POA: Diagnosis not present

## 2023-05-16 DIAGNOSIS — N644 Mastodynia: Secondary | ICD-10-CM | POA: Diagnosis not present

## 2023-05-16 DIAGNOSIS — N6324 Unspecified lump in the left breast, lower inner quadrant: Secondary | ICD-10-CM | POA: Diagnosis not present

## 2023-05-17 DIAGNOSIS — N644 Mastodynia: Secondary | ICD-10-CM | POA: Diagnosis not present

## 2023-05-17 DIAGNOSIS — N6320 Unspecified lump in the left breast, unspecified quadrant: Secondary | ICD-10-CM | POA: Diagnosis not present

## 2023-05-17 DIAGNOSIS — N6324 Unspecified lump in the left breast, lower inner quadrant: Secondary | ICD-10-CM | POA: Diagnosis not present

## 2023-05-28 DIAGNOSIS — F4322 Adjustment disorder with anxiety: Secondary | ICD-10-CM | POA: Diagnosis not present

## 2023-05-28 DIAGNOSIS — F331 Major depressive disorder, recurrent, moderate: Secondary | ICD-10-CM | POA: Diagnosis not present

## 2023-06-03 ENCOUNTER — Encounter (HOSPITAL_COMMUNITY): Payer: Self-pay

## 2023-06-03 ENCOUNTER — Ambulatory Visit (HOSPITAL_COMMUNITY)
Admission: EM | Admit: 2023-06-03 | Discharge: 2023-06-03 | Disposition: A | Discharge status: Home / Self Care | Payer: Medicaid Other | Attending: Internal Medicine | Admitting: Internal Medicine

## 2023-06-03 DIAGNOSIS — R350 Frequency of micturition: Secondary | ICD-10-CM | POA: Diagnosis not present

## 2023-06-03 DIAGNOSIS — Z3202 Encounter for pregnancy test, result negative: Secondary | ICD-10-CM | POA: Diagnosis not present

## 2023-06-03 DIAGNOSIS — R109 Unspecified abdominal pain: Secondary | ICD-10-CM | POA: Diagnosis not present

## 2023-06-03 LAB — POCT URINALYSIS DIP (MANUAL ENTRY)
Bilirubin, UA: NEGATIVE
Glucose, UA: NEGATIVE mg/dL
Ketones, POC UA: NEGATIVE mg/dL
Nitrite, UA: NEGATIVE
Spec Grav, UA: >=1.03 — AB (ref 1.010–1.025)
Urobilinogen, UA: 0.2 U/dL
pH, UA: 5.5 (ref 5.0–8.0)

## 2023-06-03 LAB — POCT URINE PREGNANCY: Preg Test, Ur: NEGATIVE

## 2023-06-03 MED ORDER — LIDOCAINE HCL (PF) 1 % IJ SOLN
INTRAMUSCULAR | Status: AC
  Filled 2023-06-03: qty 2

## 2023-06-03 MED ORDER — SULFAMETHOXAZOLE-TRIMETHOPRIM 800-160 MG PO TABS: 1.0000 | ORAL_TABLET | Freq: Two times a day (BID) | ORAL | 0 refills | Status: AC

## 2023-06-03 MED ORDER — CEFTRIAXONE SODIUM 1 G IJ SOLR
1.0000 g | Freq: Once | INTRAMUSCULAR | Status: AC
  Administered 2023-06-03: 1 g via INTRAMUSCULAR

## 2023-06-03 MED ORDER — CEFTRIAXONE SODIUM 1 G IJ SOLR
INTRAMUSCULAR | Status: AC
  Filled 2023-06-03: qty 10

## 2023-06-03 NOTE — ED Triage Notes (Signed)
Pt presents to office for Dysuria x 2 days.

## 2023-06-03 NOTE — Discharge Instructions (Signed)
You were given an antibiotic shot today in urgent care and I have also sent antibiotic pills over to the pharmacy to treat concern for kidney infection.  If symptoms persist or worsen or if you develop fever, please go to the emergency department for further evaluation and management.

## 2023-06-03 NOTE — ED Provider Notes (Signed)
[facility]    CSN: [id number] Arrival date & time: 06/03/23  1033      History   Chief Complaint No chief complaint on file.   HPI Haley Potts is a 27 y.o. female.   Patient presents with right-sided flank pain and urinary frequency that started about 2 days ago.  She denies urinary burning, hematuria, abdominal pain, fever, chills.  Denies history of kidney stones.  Denies vaginal discharge or any concern for STD.  States she is currently on her menstrual cycle.     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 Authorizing Provider  sulfamethoxazole-trimethoprim (BACTRIM DS) 800-160 MG tablet Take 1 tablet by mouth 2 (two) times daily for 7 days. 06/03/23 06/10/23 Yes Renatha Rosen, Acie Fredrickson, FNP  acetaminophen (TYLENOL) 325 MG tablet Take 2 tablets (650 mg total) by mouth every 4 (four) hours as needed (for pain scale < 4). 04/11/22   Mercado-Ortiz, Lahoma Crocker, DO  benzocaine-Menthol (DERMOPLAST) 20-0.5 % AERO Apply 1 Application topically as needed for irritation (perineal discomfort). 04/11/22   Mercado-Ortiz, Lahoma Crocker, DO  ibuprofen (ADVIL) 600 MG tablet Take 1 tablet (600 mg  total) by mouth every 6 (six) hours. 04/11/22   Mercado-Ortiz, Lahoma Crocker, DO  senna-docusate (SENOKOT-S) 8.6-50 MG tablet Take 2 tablets by mouth daily. 04/11/22   Mercado-Ortiz, Lahoma Crocker, DO    Family History Family History  Problem Relation Age of Onset   Healthy Mother    Healthy Father    Cancer Paternal Grandmother    Asthma Neg Hx    Diabetes Neg Hx    Heart disease Neg Hx    Hypertension Neg Hx    Stroke Neg Hx     Social History Social History   Tobacco Use   Smoking status: Never   Smokeless tobacco: Never  Vaping Use   Vaping status: Never Used  Substance Use Topics   Alcohol use: Not Currently   Drug use: No     Allergies   Patient has no known allergies.   Review of Systems Review of Systems per HPI  Physical Exam Triage Vital Signs ED Triage Vitals  Encounter Vitals Group     BP 06/03/23 1123 (!) 122/97     Systolic BP Percentile --      Diastolic BP Percentile --  Pulse Rate 06/03/23 1123 68     Resp 06/03/23 1123 18     Temp 06/03/23 1123 98.6 F (37 C)     Temp Source 06/03/23 1123 Oral     SpO2 06/03/23 1123 97 %     Weight --      Height --      Head Circumference --      Peak Flow --      Pain Score 06/03/23 1125 7     Pain Loc --      Pain Education --      Exclude from Growth Chart --    No data found.  Updated Vital Signs BP (!) 122/97 (BP Location: Left Arm)   Pulse 68   Temp 98.6 F (37 C) (Oral)   Resp 18   LMP 05/03/2023 (Approximate)   SpO2 97%   Visual Acuity Right Eye Distance:   Left Eye Distance:   Bilateral Distance:    Right Eye Near:   Left Eye Near:    Bilateral Near:     Physical Exam Constitutional:      General: She is not in acute distress.    Appearance: Normal appearance. She is not toxic-appearing or diaphoretic.  HENT:     Head: Normocephalic and atraumatic.  Eyes:     Extraocular Movements: Extraocular movements intact.     Conjunctiva/sclera: Conjunctivae normal.  Pulmonary:      Effort: Pulmonary effort is normal.  Abdominal:     General: Bowel sounds are normal. There is no distension.     Palpations: Abdomen is soft.     Tenderness: There is no abdominal tenderness. There is right CVA tenderness.  Neurological:     General: No focal deficit present.     Mental Status: She is alert and oriented to person, place, and time. Mental status is at baseline.  Psychiatric:        Mood and Affect: Mood normal.        Behavior: Behavior normal.        Thought Content: Thought content normal.        Judgment: Judgment normal.      UC Treatments / Results  Labs (all labs ordered are listed, but only abnormal results are displayed) Labs Reviewed  POCT URINALYSIS DIP (MANUAL ENTRY) - Abnormal; Notable for the following components:      Result Value   Clarity, UA cloudy (*)    Spec Grav, UA >=1.030 (*)    Blood, UA large (*)    Protein Ur, POC trace (*)    Leukocytes, UA Trace (*)    All other components within normal limits  URINE CULTURE  POCT URINE PREGNANCY    EKG   Radiology No results found.  Procedures Procedures (including critical care time)  Medications Ordered in UC Medications  cefTRIAXone (ROCEPHIN) injection 1 g (1 g Intramuscular Given 06/03/23 1209)    Initial Impression / Assessment and Plan / UC Course  I have reviewed the triage vital signs and the nursing notes.  Pertinent labs & imaging results that were available during my care of the patient were reviewed by me and considered in my medical decision making (see chart for details).     I am suspicious of possible pyelonephritis given patient is having significant flank pain and urinary symptoms.  She does have trace leukocytes on UA so will send urine culture to confirm.  Vital signs stable with no fever so do not think that emergent  evaluation is necessary.  Discussed possibility of kidney stone with patient as well and strict ER precautions if any symptoms persist or worsen or  if she develops fever.  Will opt to treat with IM Rocephin today and patient was sent Bactrim to the pharmacy.  Urine pregnancy test completed per protocol by clinical staff prior to provider examination.  Patient verbalized understanding and was agreeable with plan. Final Clinical Impressions(s) / UC Diagnoses   Final diagnoses:  Flank pain  Urinary frequency  Urine pregnancy test negative     Discharge Instructions      You were given an antibiotic shot today in urgent care and I have also sent antibiotic pills over to the pharmacy to treat concern for kidney infection.  If symptoms persist or worsen or if you develop fever, please go to the emergency department for further evaluation and management.    ED Prescriptions     Medication Sig Dispense Auth. Provider   sulfamethoxazole-trimethoprim (BACTRIM DS) 800-160 MG tablet Take 1 tablet by mouth 2 (two) times daily for 7 days. 14 tablet [place], Acie Fredrickson, [place]      PDMP not reviewed this encounter.   Gustavus Bryant, [place] 06/03/23 1250

## 2023-06-04 LAB — URINE CULTURE: Culture: 40000 — AB

## 2024-01-12 DIAGNOSIS — F411 Generalized anxiety disorder: Secondary | ICD-10-CM | POA: Diagnosis not present

## 2024-01-13 DIAGNOSIS — F411 Generalized anxiety disorder: Secondary | ICD-10-CM | POA: Diagnosis not present

## 2024-01-18 DIAGNOSIS — F411 Generalized anxiety disorder: Secondary | ICD-10-CM | POA: Diagnosis not present

## 2024-01-19 ENCOUNTER — Encounter: Payer: Self-pay | Admitting: Advanced Practice Midwife

## 2024-01-19 DIAGNOSIS — F411 Generalized anxiety disorder: Secondary | ICD-10-CM | POA: Diagnosis not present

## 2024-01-25 DIAGNOSIS — F411 Generalized anxiety disorder: Secondary | ICD-10-CM | POA: Diagnosis not present

## 2024-01-27 DIAGNOSIS — F411 Generalized anxiety disorder: Secondary | ICD-10-CM | POA: Diagnosis not present

## 2024-02-06 DIAGNOSIS — F411 Generalized anxiety disorder: Secondary | ICD-10-CM | POA: Diagnosis not present

## 2024-02-10 DIAGNOSIS — F411 Generalized anxiety disorder: Secondary | ICD-10-CM | POA: Diagnosis not present

## 2024-02-15 DIAGNOSIS — F411 Generalized anxiety disorder: Secondary | ICD-10-CM | POA: Diagnosis not present

## 2024-02-17 DIAGNOSIS — F411 Generalized anxiety disorder: Secondary | ICD-10-CM | POA: Diagnosis not present

## 2024-02-22 DIAGNOSIS — F411 Generalized anxiety disorder: Secondary | ICD-10-CM | POA: Diagnosis not present

## 2024-02-23 DIAGNOSIS — F411 Generalized anxiety disorder: Secondary | ICD-10-CM | POA: Diagnosis not present

## 2024-02-26 DIAGNOSIS — F411 Generalized anxiety disorder: Secondary | ICD-10-CM | POA: Diagnosis not present

## 2024-03-02 DIAGNOSIS — F411 Generalized anxiety disorder: Secondary | ICD-10-CM | POA: Diagnosis not present

## 2024-03-07 DIAGNOSIS — F411 Generalized anxiety disorder: Secondary | ICD-10-CM | POA: Diagnosis not present

## 2024-03-09 DIAGNOSIS — F411 Generalized anxiety disorder: Secondary | ICD-10-CM | POA: Diagnosis not present

## 2024-03-13 DIAGNOSIS — F411 Generalized anxiety disorder: Secondary | ICD-10-CM | POA: Diagnosis not present

## 2024-03-15 DIAGNOSIS — F411 Generalized anxiety disorder: Secondary | ICD-10-CM | POA: Diagnosis not present

## 2024-03-21 DIAGNOSIS — F411 Generalized anxiety disorder: Secondary | ICD-10-CM | POA: Diagnosis not present

## 2024-03-23 DIAGNOSIS — F411 Generalized anxiety disorder: Secondary | ICD-10-CM | POA: Diagnosis not present

## 2024-03-29 DIAGNOSIS — F411 Generalized anxiety disorder: Secondary | ICD-10-CM | POA: Diagnosis not present

## 2024-03-30 DIAGNOSIS — F411 Generalized anxiety disorder: Secondary | ICD-10-CM | POA: Diagnosis not present

## 2024-05-02 DIAGNOSIS — F411 Generalized anxiety disorder: Secondary | ICD-10-CM | POA: Diagnosis not present

## 2024-05-09 DIAGNOSIS — F411 Generalized anxiety disorder: Secondary | ICD-10-CM | POA: Diagnosis not present

## 2024-05-11 DIAGNOSIS — F411 Generalized anxiety disorder: Secondary | ICD-10-CM | POA: Diagnosis not present

## 2024-05-17 DIAGNOSIS — F411 Generalized anxiety disorder: Secondary | ICD-10-CM | POA: Diagnosis not present

## 2024-05-18 DIAGNOSIS — F411 Generalized anxiety disorder: Secondary | ICD-10-CM | POA: Diagnosis not present

## 2024-05-22 DIAGNOSIS — F411 Generalized anxiety disorder: Secondary | ICD-10-CM | POA: Diagnosis not present

## 2024-05-25 DIAGNOSIS — F411 Generalized anxiety disorder: Secondary | ICD-10-CM | POA: Diagnosis not present

## 2024-05-31 DIAGNOSIS — F411 Generalized anxiety disorder: Secondary | ICD-10-CM | POA: Diagnosis not present

## 2024-06-04 DIAGNOSIS — F411 Generalized anxiety disorder: Secondary | ICD-10-CM | POA: Diagnosis not present

## 2024-06-07 DIAGNOSIS — F411 Generalized anxiety disorder: Secondary | ICD-10-CM | POA: Diagnosis not present

## 2024-06-12 DIAGNOSIS — F411 Generalized anxiety disorder: Secondary | ICD-10-CM | POA: Diagnosis not present

## 2024-06-14 DIAGNOSIS — F411 Generalized anxiety disorder: Secondary | ICD-10-CM | POA: Diagnosis not present

## 2024-06-18 DIAGNOSIS — F411 Generalized anxiety disorder: Secondary | ICD-10-CM | POA: Diagnosis not present

## 2024-06-21 DIAGNOSIS — F411 Generalized anxiety disorder: Secondary | ICD-10-CM | POA: Diagnosis not present

## 2024-06-26 DIAGNOSIS — F411 Generalized anxiety disorder: Secondary | ICD-10-CM | POA: Diagnosis not present

## 2024-06-29 DIAGNOSIS — F411 Generalized anxiety disorder: Secondary | ICD-10-CM | POA: Diagnosis not present
